# Patient Record
Sex: Male | Born: 1955 | Race: Black or African American | Hispanic: No | Marital: Single | Smoking: Former smoker
Health system: Southern US, Community
[De-identification: ages and names within clinical notes are randomized; demographics above are authoritative.]

## PROBLEM LIST (undated history)

## (undated) DIAGNOSIS — B1920 Unspecified viral hepatitis C without hepatic coma: Secondary | ICD-10-CM

## (undated) DIAGNOSIS — N289 Disorder of kidney and ureter, unspecified: Secondary | ICD-10-CM

## (undated) DIAGNOSIS — J449 Chronic obstructive pulmonary disease, unspecified: Secondary | ICD-10-CM

## (undated) HISTORY — PX: TOTAL HIP ARTHROPLASTY: SHX124

---

## 2006-05-01 ENCOUNTER — Emergency Department (HOSPITAL_COMMUNITY): Admission: EM | Admit: 2006-05-01 | Discharge: 2006-05-01 | Payer: Self-pay | Admitting: Emergency Medicine

## 2006-12-16 ENCOUNTER — Inpatient Hospital Stay (HOSPITAL_COMMUNITY): Admission: EM | Admit: 2006-12-16 | Discharge: 2006-12-24 | Payer: Self-pay | Admitting: Emergency Medicine

## 2006-12-27 ENCOUNTER — Ambulatory Visit: Payer: Self-pay | Admitting: Nurse Practitioner

## 2007-02-26 ENCOUNTER — Emergency Department (HOSPITAL_COMMUNITY): Admission: EM | Admit: 2007-02-26 | Discharge: 2007-02-26 | Payer: Self-pay | Admitting: *Deleted

## 2007-03-29 ENCOUNTER — Emergency Department (HOSPITAL_COMMUNITY): Admission: EM | Admit: 2007-03-29 | Discharge: 2007-03-30 | Payer: Self-pay | Admitting: Emergency Medicine

## 2007-03-30 ENCOUNTER — Emergency Department (HOSPITAL_COMMUNITY): Admission: EM | Admit: 2007-03-30 | Discharge: 2007-03-30 | Payer: Self-pay | Admitting: Emergency Medicine

## 2007-05-13 ENCOUNTER — Emergency Department (HOSPITAL_COMMUNITY): Admission: EM | Admit: 2007-05-13 | Discharge: 2007-05-13 | Payer: Self-pay | Admitting: Emergency Medicine

## 2007-05-14 ENCOUNTER — Emergency Department (HOSPITAL_COMMUNITY): Admission: EM | Admit: 2007-05-14 | Discharge: 2007-05-14 | Payer: Self-pay | Admitting: Emergency Medicine

## 2007-05-16 ENCOUNTER — Emergency Department (HOSPITAL_COMMUNITY): Admission: EM | Admit: 2007-05-16 | Discharge: 2007-05-16 | Payer: Self-pay | Admitting: Emergency Medicine

## 2007-06-07 ENCOUNTER — Emergency Department (HOSPITAL_COMMUNITY): Admission: EM | Admit: 2007-06-07 | Discharge: 2007-06-07 | Payer: Self-pay | Admitting: Emergency Medicine

## 2007-06-08 ENCOUNTER — Emergency Department (HOSPITAL_COMMUNITY): Admission: EM | Admit: 2007-06-08 | Discharge: 2007-06-08 | Payer: Self-pay | Admitting: Emergency Medicine

## 2007-06-15 ENCOUNTER — Emergency Department (HOSPITAL_COMMUNITY): Admission: EM | Admit: 2007-06-15 | Discharge: 2007-06-15 | Payer: Self-pay | Admitting: Emergency Medicine

## 2007-06-15 ENCOUNTER — Emergency Department (HOSPITAL_COMMUNITY): Admission: EM | Admit: 2007-06-15 | Discharge: 2007-06-16 | Payer: Self-pay | Admitting: Emergency Medicine

## 2007-12-17 ENCOUNTER — Ambulatory Visit: Payer: Self-pay | Admitting: Cardiology

## 2007-12-18 ENCOUNTER — Inpatient Hospital Stay (HOSPITAL_COMMUNITY): Admission: EM | Admit: 2007-12-18 | Discharge: 2007-12-31 | Payer: Self-pay | Admitting: Emergency Medicine

## 2007-12-18 ENCOUNTER — Encounter (INDEPENDENT_AMBULATORY_CARE_PROVIDER_SITE_OTHER): Payer: Self-pay | Admitting: Internal Medicine

## 2008-01-16 ENCOUNTER — Ambulatory Visit: Payer: Self-pay | Admitting: Internal Medicine

## 2008-01-16 LAB — CONVERTED CEMR LAB
AST: 34 units/L (ref 0–37)
Albumin: 3.6 g/dL (ref 3.5–5.2)
Alkaline Phosphatase: 66 units/L (ref 39–117)
BUN: 7 mg/dL (ref 6–23)
Basophils Absolute: 0 10*3/uL (ref 0.0–0.1)
Basophils Relative: 1 % (ref 0–1)
Eosinophils Absolute: 0.2 10*3/uL (ref 0.0–0.7)
HDL: 41 mg/dL (ref >39)
LDL Cholesterol: 71 mg/dL (ref 0–99)
MCHC: 32.5 g/dL (ref 30.0–36.0)
MCV: 104.5 fL — ABNORMAL HIGH (ref 78.0–100.0)
Neutrophils Relative %: 37 % — ABNORMAL LOW (ref 43–77)
Platelets: 203 10*3/uL (ref 150–400)
Potassium: 4.6 meq/L (ref 3.5–5.3)
RDW: 12.6 % (ref 11.5–15.5)
Sodium: 137 meq/L (ref 135–145)
Total Protein: 8.8 g/dL — ABNORMAL HIGH (ref 6.0–8.3)

## 2008-05-11 ENCOUNTER — Ambulatory Visit: Payer: Self-pay | Admitting: Vascular Surgery

## 2008-05-11 ENCOUNTER — Emergency Department (HOSPITAL_COMMUNITY): Admission: EM | Admit: 2008-05-11 | Discharge: 2008-05-11 | Payer: Self-pay | Admitting: Emergency Medicine

## 2008-05-11 ENCOUNTER — Encounter (INDEPENDENT_AMBULATORY_CARE_PROVIDER_SITE_OTHER): Payer: Self-pay | Admitting: Emergency Medicine

## 2008-08-28 IMAGING — CR DG HIP COMPLETE 2+V*R*
3 series · 3 of 3 positions shown · non-contrast
Comparison: 02/26/2007.

CLINICAL DATA: Right hip pain following a fall.

RIGHT HIP - 3 VIEW

[t hip ap right]
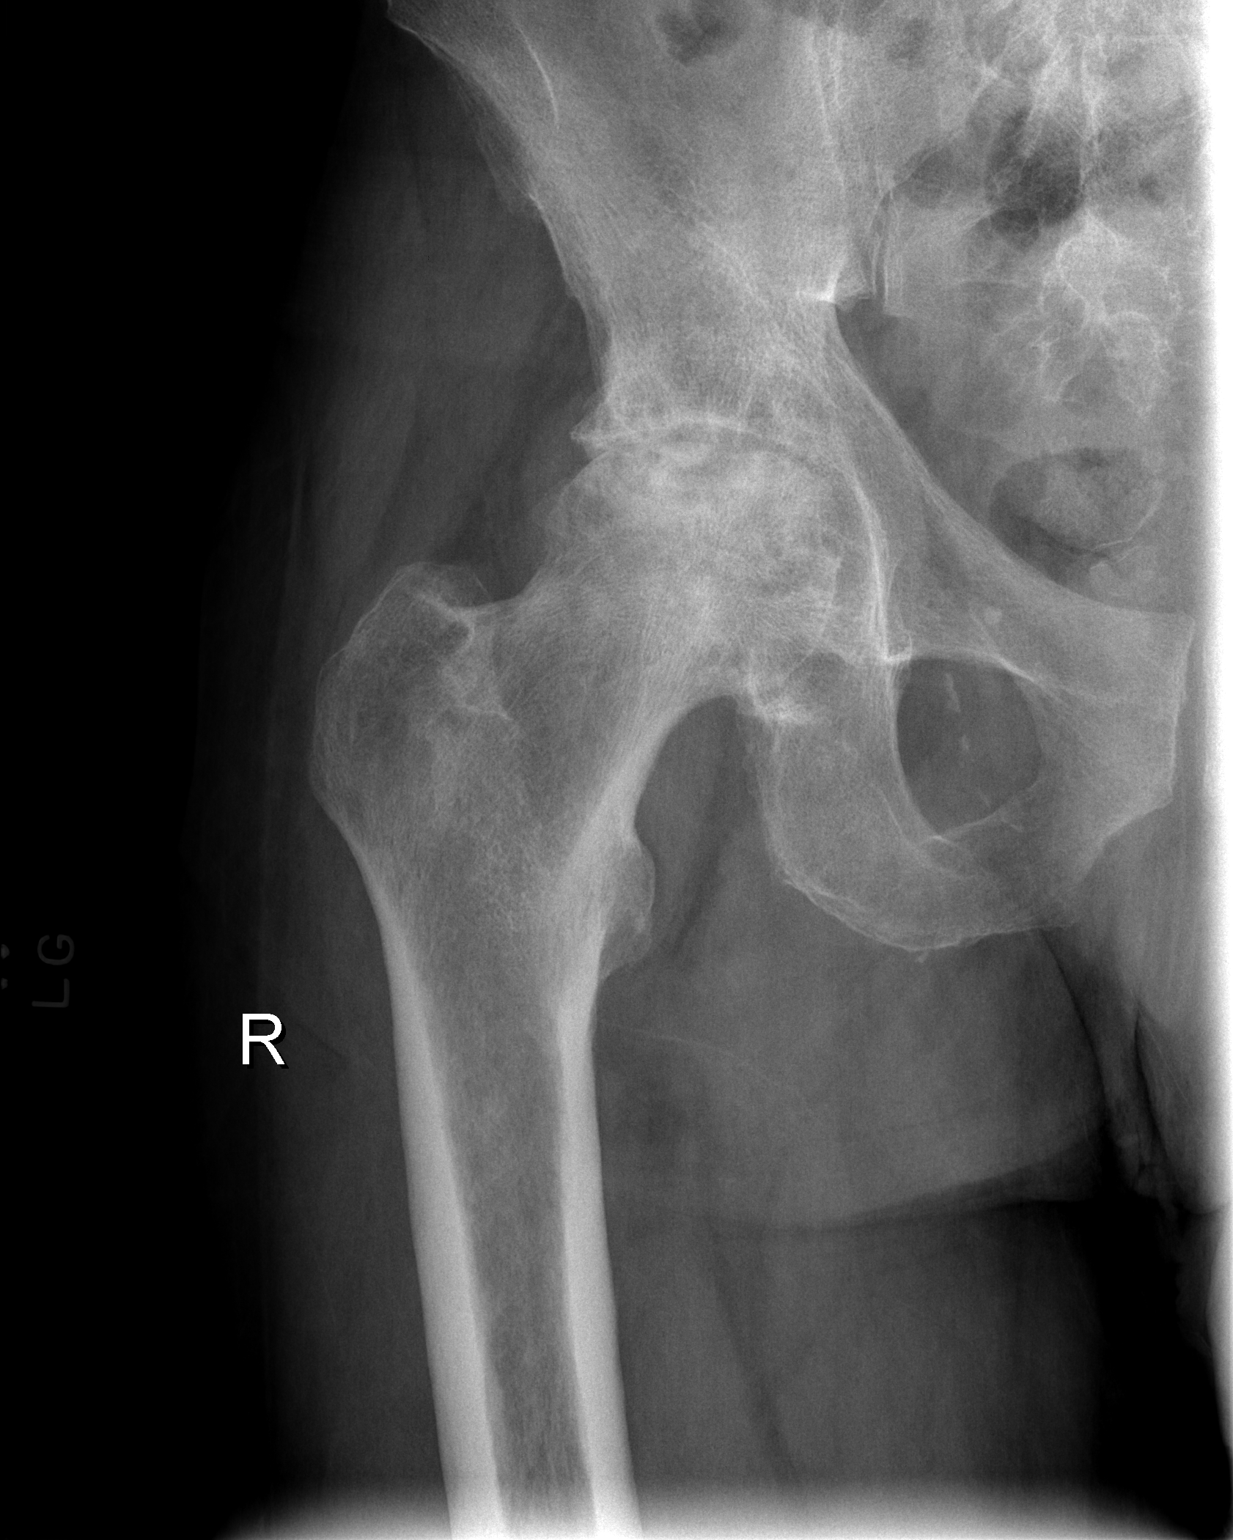

[t pelvis a.p.]
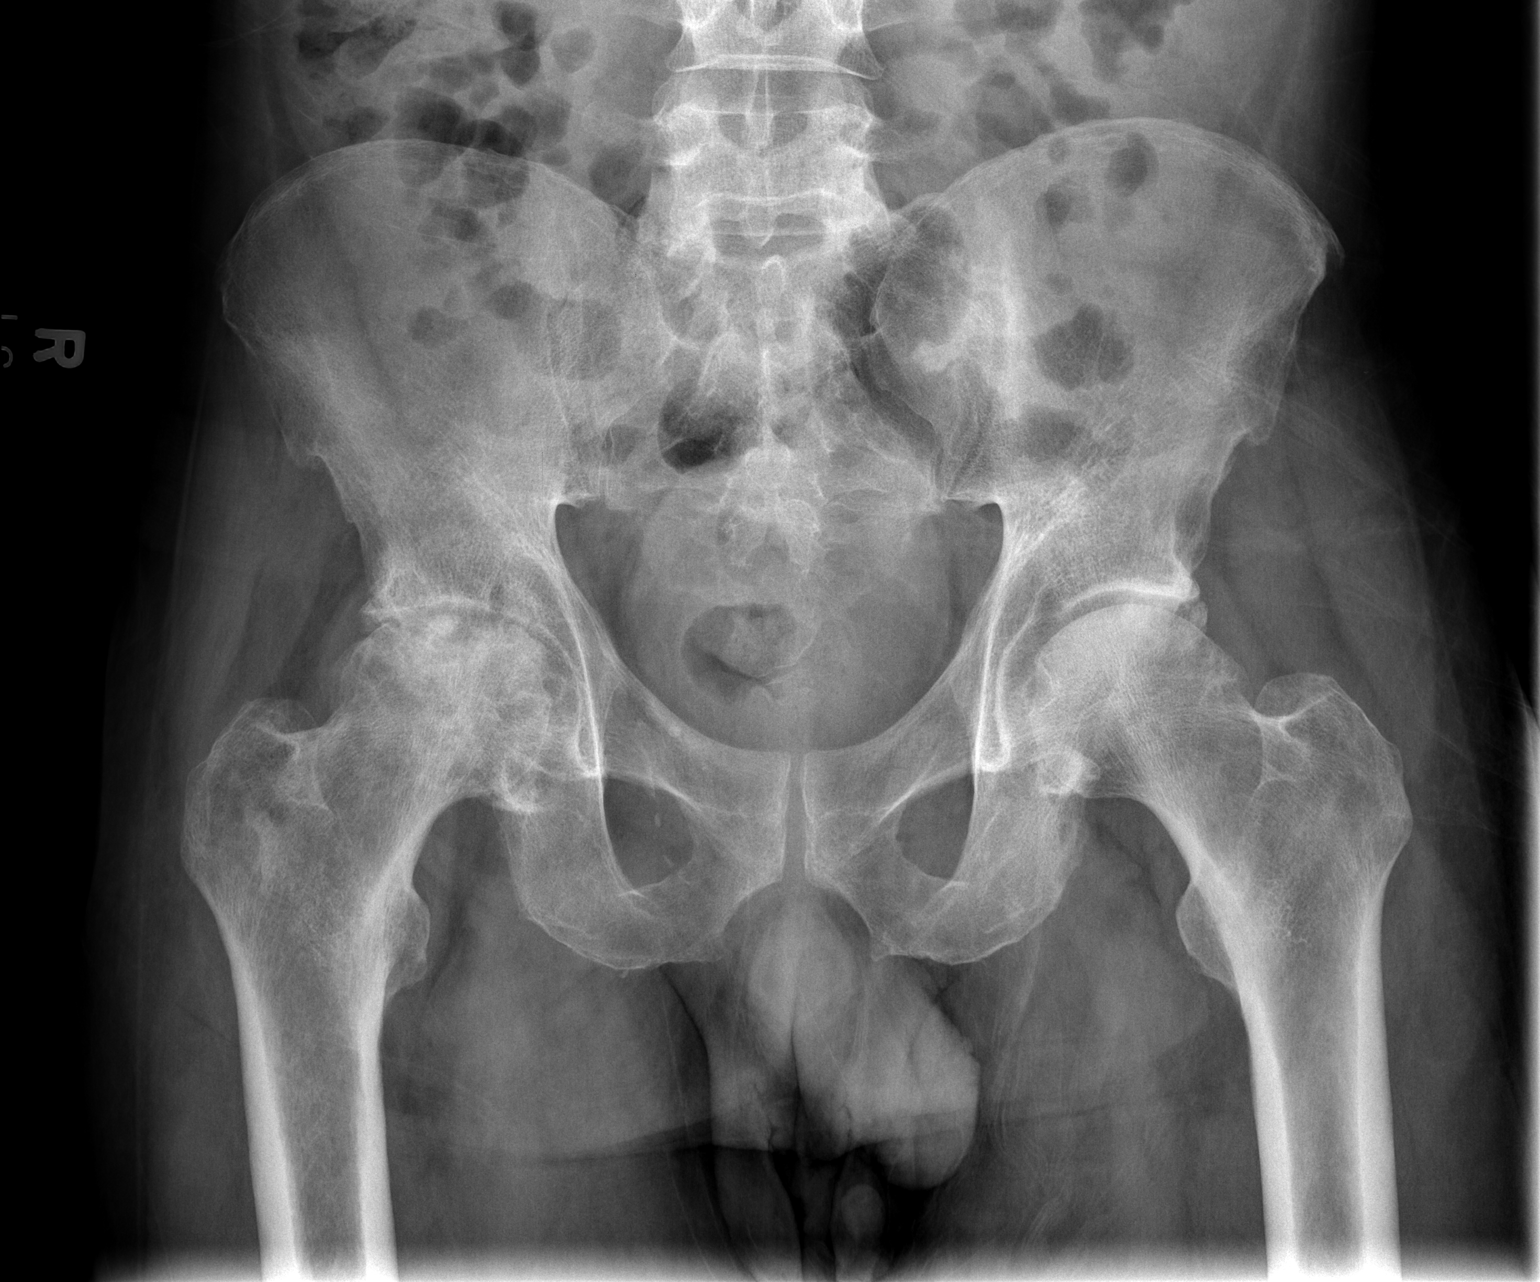

[t hip frog leg right]
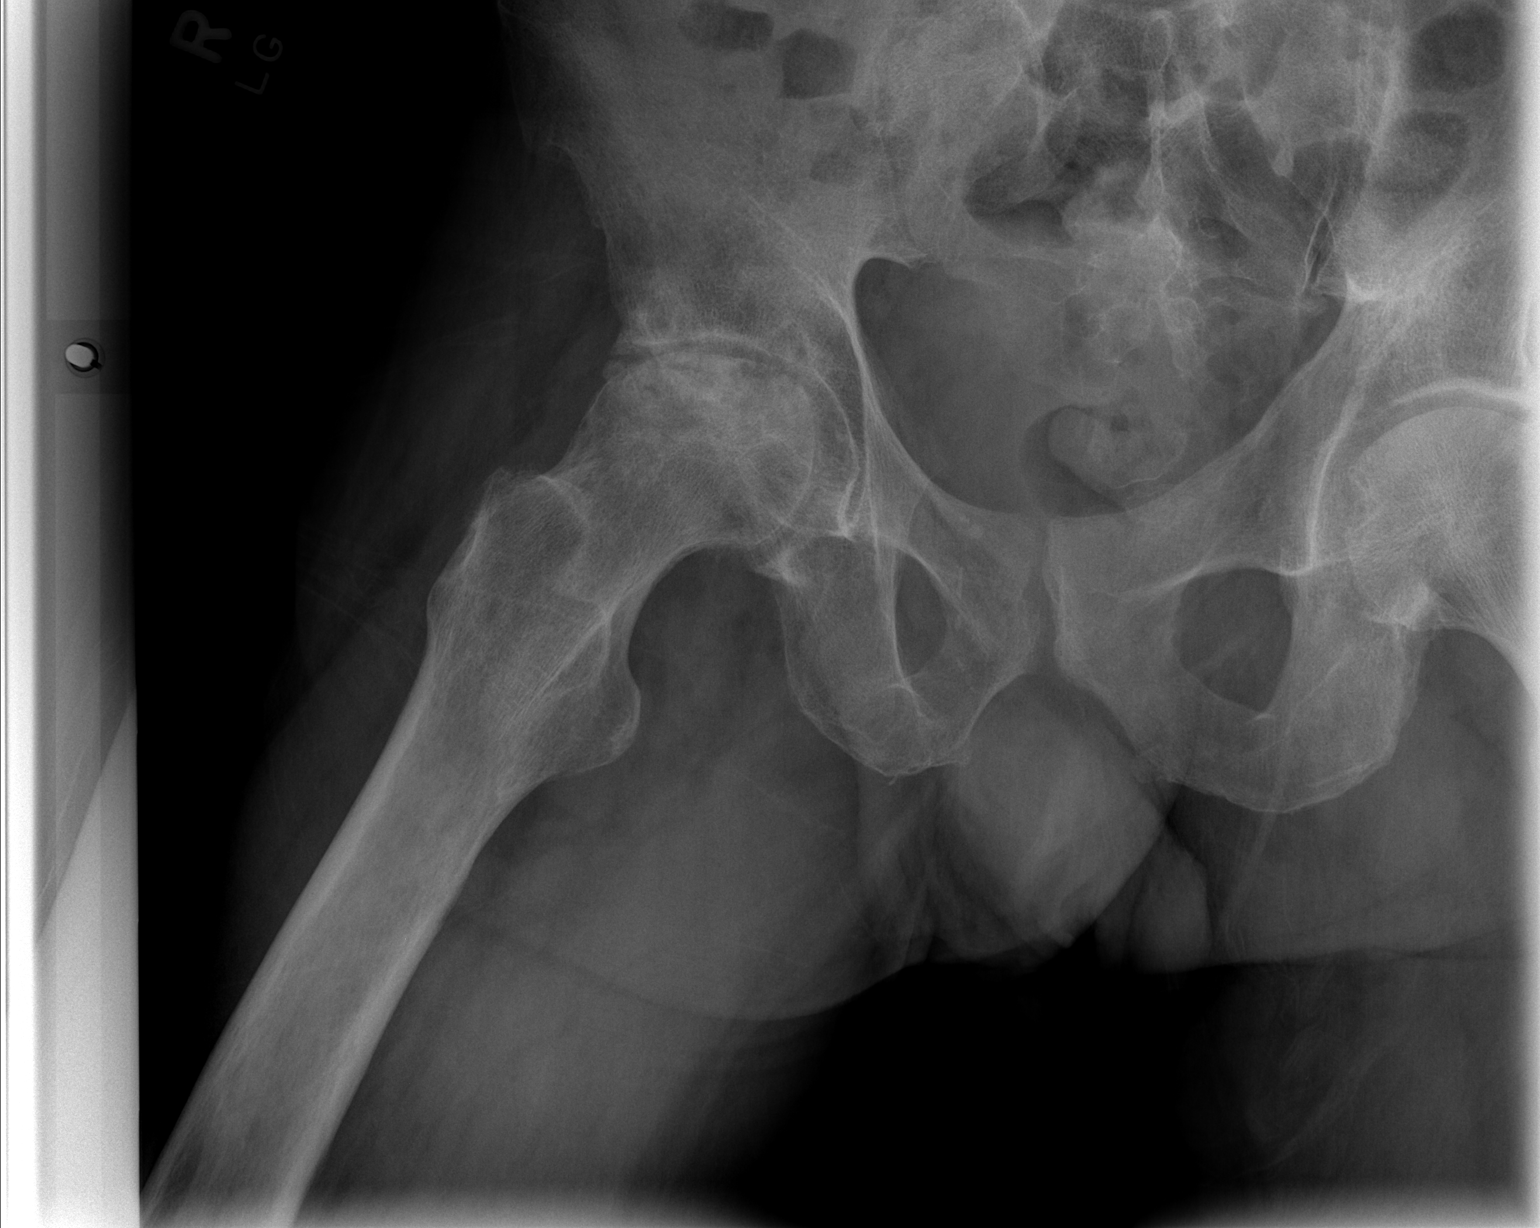

[3 of 3 positions shown; findings below may reference images not displayed]

FINDINGS: Stable marked right hip degenerative changes and moderate left hip
degenerative changes. There is sclerosis and lucency involving the entire right
femoral head and superolateral aspect of the left femoral head. There is
continued mild collapse of the superior aspect of the femoral head on the right.
No acute fracture or dislocation seen.

IMPRESSION

1. No acute fracture or dislocation.
2. Stable bilateral femoral head avascular necrosis and degenerative changes,
greater on the right.

## 2008-10-15 ENCOUNTER — Ambulatory Visit: Payer: Self-pay | Admitting: Internal Medicine

## 2008-10-20 ENCOUNTER — Ambulatory Visit: Payer: Self-pay | Admitting: Internal Medicine

## 2008-10-20 ENCOUNTER — Ambulatory Visit (HOSPITAL_COMMUNITY): Admission: RE | Admit: 2008-10-20 | Discharge: 2008-10-20 | Payer: Self-pay | Admitting: Internal Medicine

## 2008-12-08 ENCOUNTER — Ambulatory Visit: Payer: Self-pay | Admitting: Internal Medicine

## 2009-04-04 ENCOUNTER — Ambulatory Visit: Payer: Self-pay | Admitting: Vascular Surgery

## 2009-04-04 ENCOUNTER — Encounter (INDEPENDENT_AMBULATORY_CARE_PROVIDER_SITE_OTHER): Payer: Self-pay | Admitting: Emergency Medicine

## 2009-04-04 ENCOUNTER — Inpatient Hospital Stay (HOSPITAL_COMMUNITY): Admission: EM | Admit: 2009-04-04 | Discharge: 2009-04-25 | Payer: Self-pay | Admitting: Emergency Medicine

## 2009-04-12 ENCOUNTER — Encounter: Payer: Self-pay | Admitting: Internal Medicine

## 2009-04-12 ENCOUNTER — Ambulatory Visit: Payer: Self-pay | Admitting: Internal Medicine

## 2009-04-16 ENCOUNTER — Encounter: Payer: Self-pay | Admitting: Internal Medicine

## 2010-01-03 ENCOUNTER — Inpatient Hospital Stay (HOSPITAL_COMMUNITY): Admission: EM | Admit: 2010-01-03 | Discharge: 2010-01-11 | Payer: Self-pay | Admitting: Emergency Medicine

## 2010-01-03 ENCOUNTER — Encounter (INDEPENDENT_AMBULATORY_CARE_PROVIDER_SITE_OTHER): Payer: Self-pay | Admitting: Emergency Medicine

## 2010-01-03 ENCOUNTER — Ambulatory Visit: Payer: Self-pay | Admitting: Critical Care Medicine

## 2010-01-03 ENCOUNTER — Ambulatory Visit: Payer: Self-pay | Admitting: Surgery

## 2010-01-05 ENCOUNTER — Encounter: Payer: Self-pay | Admitting: Internal Medicine

## 2010-05-08 ENCOUNTER — Encounter: Payer: Self-pay | Admitting: Orthopedic Surgery

## 2010-06-30 LAB — CARDIAC PANEL(CRET KIN+CKTOT+MB+TROPI)
CK, MB: 25.5 ng/mL (ref 0.3–4.0)
Relative Index: 0.9 (ref 0.0–2.5)
Relative Index: 2.6 — ABNORMAL HIGH (ref 0.0–2.5)
Total CK: 991 U/L — ABNORMAL HIGH (ref 7–232)
Troponin I: 0.05 ng/mL (ref 0.00–0.06)

## 2010-06-30 LAB — BASIC METABOLIC PANEL
BUN: 6 mg/dL (ref 6–23)
BUN: 8 mg/dL (ref 6–23)
CO2: 23 mEq/L (ref 19–32)
CO2: 24 mEq/L (ref 19–32)
CO2: 28 mEq/L (ref 19–32)
Calcium: 8.6 mg/dL (ref 8.4–10.5)
Calcium: 8.8 mg/dL (ref 8.4–10.5)
Calcium: 8.8 mg/dL (ref 8.4–10.5)
Chloride: 112 mEq/L (ref 96–112)
Creatinine, Ser: 0.46 mg/dL (ref 0.4–1.5)
Creatinine, Ser: 0.5 mg/dL (ref 0.4–1.5)
GFR calc Af Amer: >60 mL/min (ref >60)
GFR calc Af Amer: >60 mL/min (ref >60)
GFR calc Af Amer: >60 mL/min (ref >60)
GFR calc non Af Amer: >60 mL/min (ref >60)
GFR calc non Af Amer: >60 mL/min (ref >60)
Glucose, Bld: 107 mg/dL — ABNORMAL HIGH (ref 70–99)
Glucose, Bld: 80 mg/dL (ref 70–99)
Glucose, Bld: 95 mg/dL (ref 70–99)
Potassium: 3.4 mEq/L — ABNORMAL LOW (ref 3.5–5.1)
Sodium: 137 mEq/L (ref 135–145)
Sodium: 138 mEq/L (ref 135–145)

## 2010-06-30 LAB — BRAIN NATRIURETIC PEPTIDE
Pro B Natriuretic peptide (BNP): 40 pg/mL (ref 0.0–100.0)
Pro B Natriuretic peptide (BNP): <30 pg/mL (ref 0.0–100.0)

## 2010-06-30 LAB — DIFFERENTIAL
Basophils Absolute: 0 10*3/uL (ref 0.0–0.1)
Basophils Absolute: 0 10*3/uL (ref 0.0–0.1)
Basophils Relative: 0 % (ref 0–1)
Eosinophils Absolute: 0 10*3/uL (ref 0.0–0.7)
Lymphocytes Relative: 15 % (ref 12–46)
Lymphocytes Relative: 23 % (ref 12–46)
Lymphs Abs: 2.5 10*3/uL (ref 0.7–4.0)
Neutro Abs: 10 10*3/uL — ABNORMAL HIGH (ref 1.7–7.7)
Neutrophils Relative %: 63 % (ref 43–77)
Neutrophils Relative %: 74 % (ref 43–77)

## 2010-06-30 LAB — CBC
HCT: 33.5 % — ABNORMAL LOW (ref 39.0–52.0)
HCT: 33.6 % — ABNORMAL LOW (ref 39.0–52.0)
HCT: 34.2 % — ABNORMAL LOW (ref 39.0–52.0)
Hemoglobin: 10.9 g/dL — ABNORMAL LOW (ref 13.0–17.0)
Hemoglobin: 11 g/dL — ABNORMAL LOW (ref 13.0–17.0)
Hemoglobin: 11.2 g/dL — ABNORMAL LOW (ref 13.0–17.0)
Hemoglobin: 11.9 g/dL — ABNORMAL LOW (ref 13.0–17.0)
Hemoglobin: 13.7 g/dL (ref 13.0–17.0)
MCH: 34.2 pg — ABNORMAL HIGH (ref 26.0–34.0)
MCH: 34.3 pg — ABNORMAL HIGH (ref 26.0–34.0)
MCH: 34.7 pg — ABNORMAL HIGH (ref 26.0–34.0)
MCH: 34.8 pg — ABNORMAL HIGH (ref 26.0–34.0)
MCH: 35.6 pg — ABNORMAL HIGH (ref 26.0–34.0)
MCHC: 32.7 g/dL (ref 30.0–36.0)
MCHC: 33 g/dL (ref 30.0–36.0)
MCHC: 33.1 g/dL (ref 30.0–36.0)
MCHC: 34.4 g/dL (ref 30.0–36.0)
MCV: 104.4 fL — ABNORMAL HIGH (ref 78.0–100.0)
Platelets: 222 10*3/uL (ref 150–400)
Platelets: 254 10*3/uL (ref 150–400)
Platelets: 263 10*3/uL (ref 150–400)
RBC: 3.19 MIL/uL — ABNORMAL LOW (ref 4.22–5.81)
RBC: 3.22 MIL/uL — ABNORMAL LOW (ref 4.22–5.81)
RBC: 3.27 MIL/uL — ABNORMAL LOW (ref 4.22–5.81)
RBC: 3.94 MIL/uL — ABNORMAL LOW (ref 4.22–5.81)
RBC: 3.96 MIL/uL — ABNORMAL LOW (ref 4.22–5.81)
RDW: 11.9 % (ref 11.5–15.5)
RDW: 11.9 % (ref 11.5–15.5)
RDW: 12.1 % (ref 11.5–15.5)
WBC: 11.9 10*3/uL — ABNORMAL HIGH (ref 4.0–10.5)
WBC: 13.4 10*3/uL — ABNORMAL HIGH (ref 4.0–10.5)
WBC: 15.5 10*3/uL — ABNORMAL HIGH (ref 4.0–10.5)
WBC: 8.9 10*3/uL (ref 4.0–10.5)

## 2010-06-30 LAB — RENAL FUNCTION PANEL
Albumin: 1.9 g/dL — ABNORMAL LOW (ref 3.5–5.2)
Albumin: 2.1 g/dL — ABNORMAL LOW (ref 3.5–5.2)
CO2: 32 mEq/L (ref 19–32)
Calcium: 7.7 mg/dL — ABNORMAL LOW (ref 8.4–10.5)
Chloride: 106 mEq/L (ref 96–112)
Chloride: 112 mEq/L (ref 96–112)
Creatinine, Ser: 0.45 mg/dL (ref 0.4–1.5)
GFR calc Af Amer: >60 mL/min (ref >60)
GFR calc Af Amer: >60 mL/min (ref >60)
GFR calc non Af Amer: >60 mL/min (ref >60)
GFR calc non Af Amer: >60 mL/min (ref >60)
Glucose, Bld: 111 mg/dL — ABNORMAL HIGH (ref 70–99)
Potassium: 2.8 mEq/L — ABNORMAL LOW (ref 3.5–5.1)
Sodium: 137 mEq/L (ref 135–145)
Sodium: 137 mEq/L (ref 135–145)
Sodium: 141 mEq/L (ref 135–145)

## 2010-06-30 LAB — URINALYSIS, ROUTINE W REFLEX MICROSCOPIC
Ketones, ur: 15 mg/dL — AB
Nitrite: POSITIVE — AB
pH: 6 (ref 5.0–8.0)

## 2010-06-30 LAB — D-DIMER, QUANTITATIVE: D-Dimer, Quant: 0.58 ug/mL-FEU — ABNORMAL HIGH (ref 0.00–0.48)

## 2010-06-30 LAB — CK TOTAL AND CKMB (NOT AT ARMC)
CK, MB: 1024.6 ng/mL (ref 0.3–4.0)
CK, MB: 1222.3 ng/mL (ref 0.3–4.0)
Total CK: 110675 U/L — ABNORMAL HIGH (ref 7–232)
Total CK: 60123 U/L — ABNORMAL HIGH (ref 7–232)

## 2010-06-30 LAB — RAPID URINE DRUG SCREEN, HOSP PERFORMED
Barbiturates: NOT DETECTED
Cocaine: NOT DETECTED
Opiates: NOT DETECTED

## 2010-06-30 LAB — CARBOXYHEMOGLOBIN
Methemoglobin: 0.5 % (ref 0.0–1.5)
O2 Saturation: 80.6 %
Total hemoglobin: 11.9 g/dL — ABNORMAL LOW (ref 13.5–18.0)

## 2010-06-30 LAB — ANTI-NUCLEAR AB-TITER (ANA TITER)

## 2010-06-30 LAB — COMPREHENSIVE METABOLIC PANEL
ALT: 273 U/L — ABNORMAL HIGH (ref 0–53)
Alkaline Phosphatase: 65 U/L (ref 39–117)
Alkaline Phosphatase: 79 U/L (ref 39–117)
BUN: 8 mg/dL (ref 6–23)
BUN: 8 mg/dL (ref 6–23)
CO2: 17 mEq/L — ABNORMAL LOW (ref 19–32)
CO2: 29 mEq/L (ref 19–32)
Calcium: 8.9 mg/dL (ref 8.4–10.5)
Chloride: 101 mEq/L (ref 96–112)
Chloride: 104 mEq/L (ref 96–112)
Creatinine, Ser: 0.46 mg/dL (ref 0.4–1.5)
Creatinine, Ser: 0.49 mg/dL (ref 0.4–1.5)
GFR calc non Af Amer: >60 mL/min (ref >60)
GFR calc non Af Amer: >60 mL/min (ref >60)
GFR calc non Af Amer: >60 mL/min (ref >60)
Glucose, Bld: 85 mg/dL (ref 70–99)
Glucose, Bld: 86 mg/dL (ref 70–99)
Glucose, Bld: 88 mg/dL (ref 70–99)
Potassium: 3.6 mEq/L (ref 3.5–5.1)
Potassium: 3.9 mEq/L (ref 3.5–5.1)
Sodium: 138 mEq/L (ref 135–145)
Total Bilirubin: 1 mg/dL (ref 0.3–1.2)
Total Bilirubin: 1.4 mg/dL — ABNORMAL HIGH (ref 0.3–1.2)

## 2010-06-30 LAB — URINE CULTURE
Colony Count: NO GROWTH
Culture  Setup Time: 201109191423
Culture: NO GROWTH

## 2010-06-30 LAB — POCT CARDIAC MARKERS
CKMB, poc: >80 ng/mL (ref 1.0–8.0)
Troponin i, poc: <0.05 ng/mL (ref 0.00–0.09)

## 2010-06-30 LAB — ANA: Anti Nuclear Antibody(ANA): POSITIVE — AB

## 2010-06-30 LAB — HEMOCCULT GUIAC POC 1CARD (OFFICE): Fecal Occult Bld: NEGATIVE

## 2010-06-30 LAB — CK
Total CK: 1476 U/L — ABNORMAL HIGH (ref 7–232)
Total CK: >50000 U/L — ABNORMAL HIGH (ref 7–232)
Total CK: >50000 U/L — ABNORMAL HIGH (ref 7–232)

## 2010-06-30 LAB — MAGNESIUM
Magnesium: 2.1 mg/dL (ref 1.5–2.5)
Magnesium: 2.2 mg/dL (ref 1.5–2.5)

## 2010-06-30 LAB — CULTURE, BLOOD (ROUTINE X 2)
Culture: NO GROWTH
Culture: NO GROWTH

## 2010-06-30 LAB — TYPE AND SCREEN

## 2010-06-30 LAB — MRSA PCR SCREENING: MRSA by PCR: NEGATIVE

## 2010-06-30 LAB — STREP PNEUMONIAE URINARY ANTIGEN: Strep Pneumo Urinary Antigen: NEGATIVE

## 2010-06-30 LAB — PROTIME-INR
INR: 1.2 (ref 0.00–1.49)
Prothrombin Time: 15.4 seconds — ABNORMAL HIGH (ref 11.6–15.2)

## 2010-06-30 LAB — CRYOGLOBULIN

## 2010-06-30 LAB — ANTI-DNA ANTIBODY, DOUBLE-STRANDED: ds DNA Ab: 4 IU/mL (ref <30)

## 2010-06-30 LAB — PHOSPHORUS: Phosphorus: 2.5 mg/dL (ref 2.3–4.6)

## 2010-06-30 LAB — AMYLASE: Amylase: 35 U/L (ref 0–105)

## 2010-06-30 LAB — TROPONIN I: Troponin I: 0.18 ng/mL — ABNORMAL HIGH (ref 0.00–0.06)

## 2010-06-30 LAB — LACTIC ACID, PLASMA: Lactic Acid, Venous: 1.9 mmol/L (ref 0.5–2.2)

## 2010-06-30 LAB — HIV ANTIBODY (ROUTINE TESTING W REFLEX): HIV: NONREACTIVE

## 2010-06-30 LAB — SJOGRENS SYNDROME-B EXTRACTABLE NUCLEAR ANTIBODY: SSB (La) (ENA) Antibody, IgG: 2 AU/mL (ref <30)

## 2010-06-30 LAB — URINE MICROSCOPIC-ADD ON

## 2010-07-03 LAB — URINALYSIS, MICROSCOPIC ONLY
Glucose, UA: NEGATIVE mg/dL
Leukocytes, UA: NEGATIVE
Specific Gravity, Urine: 1.009 (ref 1.005–1.030)
pH: 8 (ref 5.0–8.0)

## 2010-07-03 LAB — CBC
HCT: 24.6 % — ABNORMAL LOW (ref 39.0–52.0)
HCT: 25.1 % — ABNORMAL LOW (ref 39.0–52.0)
HCT: 25.4 % — ABNORMAL LOW (ref 39.0–52.0)
HCT: 27.4 % — ABNORMAL LOW (ref 39.0–52.0)
HCT: 32.3 % — ABNORMAL LOW (ref 39.0–52.0)
Hemoglobin: 8.4 g/dL — ABNORMAL LOW (ref 13.0–17.0)
Hemoglobin: 8.6 g/dL — ABNORMAL LOW (ref 13.0–17.0)
Hemoglobin: 9.2 g/dL — ABNORMAL LOW (ref 13.0–17.0)
MCHC: 33.1 g/dL (ref 30.0–36.0)
MCHC: 33.2 g/dL (ref 30.0–36.0)
MCHC: 33.7 g/dL (ref 30.0–36.0)
MCHC: 33.8 g/dL (ref 30.0–36.0)
MCHC: 33.9 g/dL (ref 30.0–36.0)
MCHC: 34.2 g/dL (ref 30.0–36.0)
MCHC: 34.3 g/dL (ref 30.0–36.0)
MCV: 101.1 fL — ABNORMAL HIGH (ref 78.0–100.0)
MCV: 101.2 fL — ABNORMAL HIGH (ref 78.0–100.0)
MCV: 101.9 fL — ABNORMAL HIGH (ref 78.0–100.0)
MCV: 102 fL — ABNORMAL HIGH (ref 78.0–100.0)
MCV: 103.1 fL — ABNORMAL HIGH (ref 78.0–100.0)
MCV: 103.3 fL — ABNORMAL HIGH (ref 78.0–100.0)
MCV: 103.4 fL — ABNORMAL HIGH (ref 78.0–100.0)
MCV: 106.2 fL — ABNORMAL HIGH (ref 78.0–100.0)
Platelets: 357 10*3/uL (ref 150–400)
Platelets: 361 10*3/uL (ref 150–400)
Platelets: 393 10*3/uL (ref 150–400)
Platelets: 492 10*3/uL — ABNORMAL HIGH (ref 150–400)
Platelets: 523 10*3/uL — ABNORMAL HIGH (ref 150–400)
RBC: 1.88 MIL/uL — ABNORMAL LOW (ref 4.22–5.81)
RBC: 2.4 MIL/uL — ABNORMAL LOW (ref 4.22–5.81)
RBC: 2.9 MIL/uL — ABNORMAL LOW (ref 4.22–5.81)
RDW: 14.4 % (ref 11.5–15.5)
RDW: 15.4 % (ref 11.5–15.5)
RDW: 15.6 % — ABNORMAL HIGH (ref 11.5–15.5)
RDW: 15.8 % — ABNORMAL HIGH (ref 11.5–15.5)
RDW: 16.2 % — ABNORMAL HIGH (ref 11.5–15.5)
RDW: 16.3 % — ABNORMAL HIGH (ref 11.5–15.5)
RDW: 16.9 % — ABNORMAL HIGH (ref 11.5–15.5)
WBC: 13.5 10*3/uL — ABNORMAL HIGH (ref 4.0–10.5)
WBC: 14.1 10*3/uL — ABNORMAL HIGH (ref 4.0–10.5)

## 2010-07-03 LAB — GLUCOSE, CAPILLARY
Glucose-Capillary: 108 mg/dL — ABNORMAL HIGH (ref 70–99)
Glucose-Capillary: 114 mg/dL — ABNORMAL HIGH (ref 70–99)
Glucose-Capillary: 75 mg/dL (ref 70–99)
Glucose-Capillary: 77 mg/dL (ref 70–99)
Glucose-Capillary: 79 mg/dL (ref 70–99)
Glucose-Capillary: 93 mg/dL (ref 70–99)

## 2010-07-03 LAB — RENAL FUNCTION PANEL
Albumin: 1.8 g/dL — ABNORMAL LOW (ref 3.5–5.2)
Albumin: 2.4 g/dL — ABNORMAL LOW (ref 3.5–5.2)
BUN: 11 mg/dL (ref 6–23)
BUN: 16 mg/dL (ref 6–23)
BUN: 26 mg/dL — ABNORMAL HIGH (ref 6–23)
CO2: 23 mEq/L (ref 19–32)
CO2: 24 mEq/L (ref 19–32)
CO2: 27 mEq/L (ref 19–32)
Calcium: 8.5 mg/dL (ref 8.4–10.5)
Calcium: 8.7 mg/dL (ref 8.4–10.5)
Calcium: 9.6 mg/dL (ref 8.4–10.5)
Chloride: 102 mEq/L (ref 96–112)
Chloride: 103 mEq/L (ref 96–112)
Chloride: 103 mEq/L (ref 96–112)
Creatinine, Ser: 3.63 mg/dL — ABNORMAL HIGH (ref 0.4–1.5)
Creatinine, Ser: 4.44 mg/dL — ABNORMAL HIGH (ref 0.4–1.5)
Creatinine, Ser: 5.17 mg/dL — ABNORMAL HIGH (ref 0.4–1.5)
Creatinine, Ser: 5.21 mg/dL — ABNORMAL HIGH (ref 0.4–1.5)
Creatinine, Ser: 5.51 mg/dL — ABNORMAL HIGH (ref 0.4–1.5)
GFR calc Af Amer: 13 mL/min — ABNORMAL LOW (ref >60)
GFR calc Af Amer: 14 mL/min — ABNORMAL LOW (ref >60)
GFR calc non Af Amer: 11 mL/min — ABNORMAL LOW (ref >60)
GFR calc non Af Amer: 12 mL/min — ABNORMAL LOW (ref >60)
Glucose, Bld: 82 mg/dL (ref 70–99)
Glucose, Bld: 92 mg/dL (ref 70–99)
Phosphorus: 6.6 mg/dL — ABNORMAL HIGH (ref 2.3–4.6)
Phosphorus: 6.9 mg/dL — ABNORMAL HIGH (ref 2.3–4.6)
Phosphorus: 9.3 mg/dL — ABNORMAL HIGH (ref 2.3–4.6)
Potassium: 4.2 mEq/L (ref 3.5–5.1)

## 2010-07-03 LAB — HEMOGLOBIN AND HEMATOCRIT, BLOOD
HCT: 20 % — ABNORMAL LOW (ref 39.0–52.0)
HCT: 25.2 % — ABNORMAL LOW (ref 39.0–52.0)
Hemoglobin: 8.8 g/dL — ABNORMAL LOW (ref 13.0–17.0)

## 2010-07-03 LAB — PHOSPHORUS
Phosphorus: 6.5 mg/dL — ABNORMAL HIGH (ref 2.3–4.6)
Phosphorus: 7 mg/dL — ABNORMAL HIGH (ref 2.3–4.6)

## 2010-07-03 LAB — COMPREHENSIVE METABOLIC PANEL
ALT: 38 U/L (ref 0–53)
ALT: 46 U/L (ref 0–53)
ALT: 56 U/L — ABNORMAL HIGH (ref 0–53)
AST: 40 U/L — ABNORMAL HIGH (ref 0–37)
Alkaline Phosphatase: 84 U/L (ref 39–117)
BUN: 16 mg/dL (ref 6–23)
CO2: 18 mEq/L — ABNORMAL LOW (ref 19–32)
CO2: 22 mEq/L (ref 19–32)
Calcium: 8.7 mg/dL (ref 8.4–10.5)
Calcium: 9.1 mg/dL (ref 8.4–10.5)
Creatinine, Ser: 4.48 mg/dL — ABNORMAL HIGH (ref 0.4–1.5)
GFR calc Af Amer: 17 mL/min — ABNORMAL LOW (ref >60)
GFR calc non Af Amer: 11 mL/min — ABNORMAL LOW (ref >60)
GFR calc non Af Amer: 14 mL/min — ABNORMAL LOW (ref >60)
Glucose, Bld: 86 mg/dL (ref 70–99)
Glucose, Bld: 92 mg/dL (ref 70–99)
Potassium: 3.6 mEq/L (ref 3.5–5.1)
Sodium: 138 mEq/L (ref 135–145)
Sodium: 139 mEq/L (ref 135–145)
Total Protein: 8.1 g/dL (ref 6.0–8.3)
Total Protein: 8.1 g/dL (ref 6.0–8.3)
Total Protein: 8.8 g/dL — ABNORMAL HIGH (ref 6.0–8.3)

## 2010-07-03 LAB — BASIC METABOLIC PANEL
Chloride: 107 mEq/L (ref 96–112)
GFR calc non Af Amer: 17 mL/min — ABNORMAL LOW (ref >60)
Glucose, Bld: 75 mg/dL (ref 70–99)
Potassium: 3.7 mEq/L (ref 3.5–5.1)
Sodium: 138 mEq/L (ref 135–145)

## 2010-07-03 LAB — DIFFERENTIAL
Basophils Absolute: 0.1 10*3/uL (ref 0.0–0.1)
Basophils Relative: 1 % (ref 0–1)
Eosinophils Absolute: 0.5 10*3/uL (ref 0.0–0.7)
Eosinophils Relative: 4 % (ref 0–5)
Neutrophils Relative %: 62 % (ref 43–77)

## 2010-07-03 LAB — MAGNESIUM
Magnesium: 2.1 mg/dL (ref 1.5–2.5)
Magnesium: 2.3 mg/dL (ref 1.5–2.5)

## 2010-07-03 LAB — IRON AND TIBC: TIBC: 232 ug/dL (ref 215–435)

## 2010-07-03 LAB — APTT: aPTT: 37 seconds (ref 24–37)

## 2010-07-03 LAB — FERRITIN: Ferritin: 1622 ng/mL — ABNORMAL HIGH (ref 22–322)

## 2010-07-18 LAB — RENAL FUNCTION PANEL
Albumin: 1.8 g/dL — ABNORMAL LOW (ref 3.5–5.2)
Albumin: 1.8 g/dL — ABNORMAL LOW (ref 3.5–5.2)
Albumin: 2 g/dL — ABNORMAL LOW (ref 3.5–5.2)
BUN: 107 mg/dL — ABNORMAL HIGH (ref 6–23)
BUN: 20 mg/dL (ref 6–23)
BUN: 22 mg/dL (ref 6–23)
BUN: 31 mg/dL — ABNORMAL HIGH (ref 6–23)
BUN: 80 mg/dL — ABNORMAL HIGH (ref 6–23)
CO2: 26 mEq/L (ref 19–32)
Calcium: 8.2 mg/dL — ABNORMAL LOW (ref 8.4–10.5)
Calcium: 8.6 mg/dL (ref 8.4–10.5)
Chloride: 101 mEq/L (ref 96–112)
Chloride: 103 mEq/L (ref 96–112)
Chloride: 105 mEq/L (ref 96–112)
Chloride: 107 mEq/L (ref 96–112)
Chloride: 95 mEq/L — ABNORMAL LOW (ref 96–112)
Chloride: 96 mEq/L (ref 96–112)
Creatinine, Ser: 3.28 mg/dL — ABNORMAL HIGH (ref 0.4–1.5)
Creatinine, Ser: 3.47 mg/dL — ABNORMAL HIGH (ref 0.4–1.5)
Creatinine, Ser: 4.55 mg/dL — ABNORMAL HIGH (ref 0.4–1.5)
GFR calc Af Amer: 11 mL/min — ABNORMAL LOW (ref >60)
GFR calc Af Amer: 17 mL/min — ABNORMAL LOW (ref >60)
GFR calc Af Amer: 24 mL/min — ABNORMAL LOW (ref >60)
GFR calc non Af Amer: 14 mL/min — ABNORMAL LOW (ref >60)
GFR calc non Af Amer: 20 mL/min — ABNORMAL LOW (ref >60)
GFR calc non Af Amer: 9 mL/min — ABNORMAL LOW (ref >60)
Glucose, Bld: 100 mg/dL — ABNORMAL HIGH (ref 70–99)
Glucose, Bld: 123 mg/dL — ABNORMAL HIGH (ref 70–99)
Glucose, Bld: 124 mg/dL — ABNORMAL HIGH (ref 70–99)
Glucose, Bld: 84 mg/dL (ref 70–99)
Glucose, Bld: 84 mg/dL (ref 70–99)
Phosphorus: 1.1 mg/dL — ABNORMAL LOW (ref 2.3–4.6)
Phosphorus: 2.2 mg/dL — ABNORMAL LOW (ref 2.3–4.6)
Phosphorus: 2.8 mg/dL (ref 2.3–4.6)
Phosphorus: 4.7 mg/dL — ABNORMAL HIGH (ref 2.3–4.6)
Potassium: 3.2 mEq/L — ABNORMAL LOW (ref 3.5–5.1)
Potassium: 3.4 mEq/L — ABNORMAL LOW (ref 3.5–5.1)
Potassium: 3.9 mEq/L (ref 3.5–5.1)
Potassium: 4.2 mEq/L (ref 3.5–5.1)
Potassium: 4.3 mEq/L (ref 3.5–5.1)
Potassium: 4.4 mEq/L (ref 3.5–5.1)
Potassium: 4.7 mEq/L (ref 3.5–5.1)
Sodium: 140 mEq/L (ref 135–145)
Sodium: 140 mEq/L (ref 135–145)
Sodium: 148 mEq/L — ABNORMAL HIGH (ref 135–145)
Sodium: 151 mEq/L — ABNORMAL HIGH (ref 135–145)

## 2010-07-18 LAB — BLOOD GAS, ARTERIAL
Acid-Base Excess: 16.4 mmol/L — ABNORMAL HIGH (ref 0.0–2.0)
Drawn by: 249101
O2 Content: 5 L/min
Patient temperature: 98.6
TCO2: 42.5 mmol/L (ref 0–100)

## 2010-07-18 LAB — CULTURE, BLOOD (ROUTINE X 2): Culture: NO GROWTH

## 2010-07-18 LAB — COMPREHENSIVE METABOLIC PANEL
ALT: 124 U/L — ABNORMAL HIGH (ref 0–53)
ALT: 151 U/L — ABNORMAL HIGH (ref 0–53)
ALT: 205 U/L — ABNORMAL HIGH (ref 0–53)
ALT: 252 U/L — ABNORMAL HIGH (ref 0–53)
ALT: 325 U/L — ABNORMAL HIGH (ref 0–53)
ALT: 388 U/L — ABNORMAL HIGH (ref 0–53)
ALT: 72 U/L — ABNORMAL HIGH (ref 0–53)
ALT: 87 U/L — ABNORMAL HIGH (ref 0–53)
AST: 142 U/L — ABNORMAL HIGH (ref 0–37)
AST: 166 U/L — ABNORMAL HIGH (ref 0–37)
AST: 291 U/L — ABNORMAL HIGH (ref 0–37)
AST: 444 U/L — ABNORMAL HIGH (ref 0–37)
AST: 71 U/L — ABNORMAL HIGH (ref 0–37)
Albumin: 1.9 g/dL — ABNORMAL LOW (ref 3.5–5.2)
Albumin: 2 g/dL — ABNORMAL LOW (ref 3.5–5.2)
Albumin: 2 g/dL — ABNORMAL LOW (ref 3.5–5.2)
Albumin: 2.3 g/dL — ABNORMAL LOW (ref 3.5–5.2)
Albumin: 3.3 g/dL — ABNORMAL LOW (ref 3.5–5.2)
Alkaline Phosphatase: 100 U/L (ref 39–117)
Alkaline Phosphatase: 67 U/L (ref 39–117)
Alkaline Phosphatase: 70 U/L (ref 39–117)
Alkaline Phosphatase: 72 U/L (ref 39–117)
Alkaline Phosphatase: 87 U/L (ref 39–117)
BUN: 100 mg/dL — ABNORMAL HIGH (ref 6–23)
BUN: 19 mg/dL (ref 6–23)
BUN: 29 mg/dL — ABNORMAL HIGH (ref 6–23)
BUN: 34 mg/dL — ABNORMAL HIGH (ref 6–23)
BUN: 43 mg/dL — ABNORMAL HIGH (ref 6–23)
BUN: 53 mg/dL — ABNORMAL HIGH (ref 6–23)
CO2: 26 mEq/L (ref 19–32)
CO2: 26 mEq/L (ref 19–32)
CO2: 26 mEq/L (ref 19–32)
CO2: 34 mEq/L — ABNORMAL HIGH (ref 19–32)
CO2: 38 mEq/L — ABNORMAL HIGH (ref 19–32)
CO2: 43 mEq/L — ABNORMAL HIGH (ref 19–32)
Calcium: 7.3 mg/dL — ABNORMAL LOW (ref 8.4–10.5)
Calcium: 7.9 mg/dL — ABNORMAL LOW (ref 8.4–10.5)
Calcium: 8.5 mg/dL (ref 8.4–10.5)
Calcium: 8.5 mg/dL (ref 8.4–10.5)
Calcium: 8.8 mg/dL (ref 8.4–10.5)
Calcium: 9.1 mg/dL (ref 8.4–10.5)
Chloride: 103 mEq/L (ref 96–112)
Chloride: 103 mEq/L (ref 96–112)
Chloride: 107 mEq/L (ref 96–112)
Chloride: 108 mEq/L (ref 96–112)
Chloride: 96 mEq/L (ref 96–112)
Chloride: 98 mEq/L (ref 96–112)
Creatinine, Ser: 1.33 mg/dL (ref 0.4–1.5)
Creatinine, Ser: 1.86 mg/dL — ABNORMAL HIGH (ref 0.4–1.5)
Creatinine, Ser: 2.13 mg/dL — ABNORMAL HIGH (ref 0.4–1.5)
Creatinine, Ser: 2.64 mg/dL — ABNORMAL HIGH (ref 0.4–1.5)
Creatinine, Ser: 6.11 mg/dL — ABNORMAL HIGH (ref 0.4–1.5)
Creatinine, Ser: 6.83 mg/dL — ABNORMAL HIGH (ref 0.4–1.5)
GFR calc Af Amer: 12 mL/min — ABNORMAL LOW (ref >60)
GFR calc Af Amer: 12 mL/min — ABNORMAL LOW (ref >60)
GFR calc Af Amer: 14 mL/min — ABNORMAL LOW (ref >60)
GFR calc Af Amer: 24 mL/min — ABNORMAL LOW (ref >60)
GFR calc Af Amer: 31 mL/min — ABNORMAL LOW (ref >60)
GFR calc non Af Amer: 10 mL/min — ABNORMAL LOW (ref >60)
GFR calc non Af Amer: 13 mL/min — ABNORMAL LOW (ref >60)
GFR calc non Af Amer: 14 mL/min — ABNORMAL LOW (ref >60)
GFR calc non Af Amer: 20 mL/min — ABNORMAL LOW (ref >60)
GFR calc non Af Amer: 20 mL/min — ABNORMAL LOW (ref >60)
GFR calc non Af Amer: 33 mL/min — ABNORMAL LOW (ref >60)
Glucose, Bld: 103 mg/dL — ABNORMAL HIGH (ref 70–99)
Glucose, Bld: 110 mg/dL — ABNORMAL HIGH (ref 70–99)
Glucose, Bld: 120 mg/dL — ABNORMAL HIGH (ref 70–99)
Glucose, Bld: 139 mg/dL — ABNORMAL HIGH (ref 70–99)
Glucose, Bld: 151 mg/dL — ABNORMAL HIGH (ref 70–99)
Glucose, Bld: 82 mg/dL (ref 70–99)
Glucose, Bld: 83 mg/dL (ref 70–99)
Glucose, Bld: 89 mg/dL (ref 70–99)
Glucose, Bld: 99 mg/dL (ref 70–99)
Potassium: 3.5 mEq/L (ref 3.5–5.1)
Potassium: 3.9 mEq/L (ref 3.5–5.1)
Potassium: 3.9 mEq/L (ref 3.5–5.1)
Potassium: 3.9 mEq/L (ref 3.5–5.1)
Sodium: 137 mEq/L (ref 135–145)
Sodium: 139 mEq/L (ref 135–145)
Sodium: 144 mEq/L (ref 135–145)
Sodium: 145 mEq/L (ref 135–145)
Sodium: 147 mEq/L — ABNORMAL HIGH (ref 135–145)
Sodium: 148 mEq/L — ABNORMAL HIGH (ref 135–145)
Sodium: 153 mEq/L — ABNORMAL HIGH (ref 135–145)
Total Bilirubin: 1.3 mg/dL — ABNORMAL HIGH (ref 0.3–1.2)
Total Bilirubin: 1.6 mg/dL — ABNORMAL HIGH (ref 0.3–1.2)
Total Bilirubin: 2.2 mg/dL — ABNORMAL HIGH (ref 0.3–1.2)
Total Bilirubin: 2.3 mg/dL — ABNORMAL HIGH (ref 0.3–1.2)
Total Bilirubin: 2.6 mg/dL — ABNORMAL HIGH (ref 0.3–1.2)
Total Bilirubin: 2.6 mg/dL — ABNORMAL HIGH (ref 0.3–1.2)
Total Bilirubin: 5.2 mg/dL — ABNORMAL HIGH (ref 0.3–1.2)
Total Bilirubin: 6.4 mg/dL — ABNORMAL HIGH (ref 0.3–1.2)
Total Protein: 6.1 g/dL (ref 6.0–8.3)
Total Protein: 6.7 g/dL (ref 6.0–8.3)
Total Protein: 6.8 g/dL (ref 6.0–8.3)
Total Protein: 7.4 g/dL (ref 6.0–8.3)
Total Protein: 7.4 g/dL (ref 6.0–8.3)
Total Protein: 7.7 g/dL (ref 6.0–8.3)

## 2010-07-18 LAB — CBC
HCT: 32.5 % — ABNORMAL LOW (ref 39.0–52.0)
HCT: 36.9 % — ABNORMAL LOW (ref 39.0–52.0)
HCT: 46.7 % (ref 39.0–52.0)
Hemoglobin: 10.9 g/dL — ABNORMAL LOW (ref 13.0–17.0)
Hemoglobin: 11 g/dL — ABNORMAL LOW (ref 13.0–17.0)
Hemoglobin: 6.9 g/dL — CL (ref 13.0–17.0)
Hemoglobin: 7.6 g/dL — ABNORMAL LOW (ref 13.0–17.0)
Hemoglobin: 8.9 g/dL — ABNORMAL LOW (ref 13.0–17.0)
Hemoglobin: 9.3 g/dL — ABNORMAL LOW (ref 13.0–17.0)
Hemoglobin: 9.9 g/dL — ABNORMAL LOW (ref 13.0–17.0)
MCHC: 33.3 g/dL (ref 30.0–36.0)
MCHC: 33.3 g/dL (ref 30.0–36.0)
MCHC: 33.6 g/dL (ref 30.0–36.0)
MCHC: 33.7 g/dL (ref 30.0–36.0)
MCHC: 33.8 g/dL (ref 30.0–36.0)
MCHC: 34 g/dL (ref 30.0–36.0)
MCHC: 34.2 g/dL (ref 30.0–36.0)
MCV: 103.8 fL — ABNORMAL HIGH (ref 78.0–100.0)
MCV: 104 fL — ABNORMAL HIGH (ref 78.0–100.0)
MCV: 105.2 fL — ABNORMAL HIGH (ref 78.0–100.0)
MCV: 105.6 fL — ABNORMAL HIGH (ref 78.0–100.0)
MCV: 106.7 fL — ABNORMAL HIGH (ref 78.0–100.0)
MCV: 107.5 fL — ABNORMAL HIGH (ref 78.0–100.0)
MCV: 109.4 fL — ABNORMAL HIGH (ref 78.0–100.0)
Platelets: 177 10*3/uL (ref 150–400)
Platelets: 212 10*3/uL (ref 150–400)
Platelets: 216 10*3/uL (ref 150–400)
RBC: 1.84 MIL/uL — ABNORMAL LOW (ref 4.22–5.81)
RBC: 2.06 MIL/uL — ABNORMAL LOW (ref 4.22–5.81)
RBC: 2.41 MIL/uL — ABNORMAL LOW (ref 4.22–5.81)
RBC: 2.72 MIL/uL — ABNORMAL LOW (ref 4.22–5.81)
RBC: 3.51 MIL/uL — ABNORMAL LOW (ref 4.22–5.81)
RDW: 12.7 % (ref 11.5–15.5)
RDW: 12.7 % (ref 11.5–15.5)
RDW: 12.7 % (ref 11.5–15.5)
RDW: 12.8 % (ref 11.5–15.5)
RDW: 15.6 % — ABNORMAL HIGH (ref 11.5–15.5)
WBC: 14.6 10*3/uL — ABNORMAL HIGH (ref 4.0–10.5)
WBC: 15.9 10*3/uL — ABNORMAL HIGH (ref 4.0–10.5)
WBC: 17.2 10*3/uL — ABNORMAL HIGH (ref 4.0–10.5)
WBC: 20.6 10*3/uL — ABNORMAL HIGH (ref 4.0–10.5)
WBC: 22.6 10*3/uL — ABNORMAL HIGH (ref 4.0–10.5)
WBC: 23.8 10*3/uL — ABNORMAL HIGH (ref 4.0–10.5)
WBC: 24.9 10*3/uL — ABNORMAL HIGH (ref 4.0–10.5)

## 2010-07-18 LAB — POCT I-STAT 3, ART BLOOD GAS (G3+)
Acid-Base Excess: 14 mmol/L — ABNORMAL HIGH (ref 0.0–2.0)
Acid-Base Excess: 14 mmol/L — ABNORMAL HIGH (ref 0.0–2.0)
Acid-Base Excess: 5 mmol/L — ABNORMAL HIGH (ref 0.0–2.0)
Bicarbonate: 27.4 mEq/L — ABNORMAL HIGH (ref 20.0–24.0)
Bicarbonate: 35.8 mEq/L — ABNORMAL HIGH (ref 20.0–24.0)
O2 Saturation: 100 %
O2 Saturation: 97 %
O2 Saturation: 98 %
TCO2: 28 mmol/L (ref 0–100)
TCO2: 49 mmol/L (ref 0–100)
pCO2 arterial: 28.5 mmHg — ABNORMAL LOW (ref 35.0–45.0)
pCO2 arterial: 30.7 mmHg — ABNORMAL LOW (ref 35.0–45.0)
pCO2 arterial: 36.6 mmHg (ref 35.0–45.0)
pCO2 arterial: 62.5 mmHg (ref 35.0–45.0)
pCO2 arterial: 66.4 mmHg (ref 35.0–45.0)
pCO2 arterial: 69.3 mmHg (ref 35.0–45.0)
pO2, Arterial: 123 mmHg — ABNORMAL HIGH (ref 80.0–100.0)
pO2, Arterial: 71 mmHg — ABNORMAL LOW (ref 80.0–100.0)
pO2, Arterial: 72 mmHg — ABNORMAL LOW (ref 80.0–100.0)
pO2, Arterial: 92 mmHg (ref 80.0–100.0)

## 2010-07-18 LAB — MRSA PCR SCREENING: MRSA by PCR: NEGATIVE

## 2010-07-18 LAB — CREATININE, URINE, RANDOM
Creatinine, Urine: 16.7 mg/dL
Creatinine, Urine: 44.7 mg/dL

## 2010-07-18 LAB — GLUCOSE, CAPILLARY
Glucose-Capillary: 101 mg/dL — ABNORMAL HIGH (ref 70–99)
Glucose-Capillary: 103 mg/dL — ABNORMAL HIGH (ref 70–99)
Glucose-Capillary: 111 mg/dL — ABNORMAL HIGH (ref 70–99)
Glucose-Capillary: 113 mg/dL — ABNORMAL HIGH (ref 70–99)
Glucose-Capillary: 117 mg/dL — ABNORMAL HIGH (ref 70–99)
Glucose-Capillary: 120 mg/dL — ABNORMAL HIGH (ref 70–99)
Glucose-Capillary: 121 mg/dL — ABNORMAL HIGH (ref 70–99)
Glucose-Capillary: 122 mg/dL — ABNORMAL HIGH (ref 70–99)
Glucose-Capillary: 129 mg/dL — ABNORMAL HIGH (ref 70–99)
Glucose-Capillary: 136 mg/dL — ABNORMAL HIGH (ref 70–99)
Glucose-Capillary: 136 mg/dL — ABNORMAL HIGH (ref 70–99)
Glucose-Capillary: 138 mg/dL — ABNORMAL HIGH (ref 70–99)
Glucose-Capillary: 165 mg/dL — ABNORMAL HIGH (ref 70–99)
Glucose-Capillary: 80 mg/dL (ref 70–99)
Glucose-Capillary: 88 mg/dL (ref 70–99)
Glucose-Capillary: 90 mg/dL (ref 70–99)
Glucose-Capillary: 90 mg/dL (ref 70–99)
Glucose-Capillary: 91 mg/dL (ref 70–99)
Glucose-Capillary: 92 mg/dL (ref 70–99)
Glucose-Capillary: 92 mg/dL (ref 70–99)
Glucose-Capillary: 92 mg/dL (ref 70–99)
Glucose-Capillary: 99 mg/dL (ref 70–99)
Glucose-Capillary: 99 mg/dL (ref 70–99)

## 2010-07-18 LAB — MAGNESIUM
Magnesium: 2.2 mg/dL (ref 1.5–2.5)
Magnesium: 2.2 mg/dL (ref 1.5–2.5)
Magnesium: 2.2 mg/dL (ref 1.5–2.5)
Magnesium: 2.3 mg/dL (ref 1.5–2.5)
Magnesium: 2.3 mg/dL (ref 1.5–2.5)
Magnesium: 2.6 mg/dL — ABNORMAL HIGH (ref 1.5–2.5)
Magnesium: 2.6 mg/dL — ABNORMAL HIGH (ref 1.5–2.5)
Magnesium: 3.1 mg/dL — ABNORMAL HIGH (ref 1.5–2.5)

## 2010-07-18 LAB — BASIC METABOLIC PANEL
BUN: 28 mg/dL — ABNORMAL HIGH (ref 6–23)
BUN: 36 mg/dL — ABNORMAL HIGH (ref 6–23)
Calcium: 7 mg/dL — ABNORMAL LOW (ref 8.4–10.5)
Calcium: 8.3 mg/dL — ABNORMAL LOW (ref 8.4–10.5)
Chloride: 104 mEq/L (ref 96–112)
Chloride: 109 mEq/L (ref 96–112)
Creatinine, Ser: 1.45 mg/dL (ref 0.4–1.5)
Creatinine, Ser: 2.52 mg/dL — ABNORMAL HIGH (ref 0.4–1.5)
Creatinine, Ser: 4.84 mg/dL — ABNORMAL HIGH (ref 0.4–1.5)
GFR calc Af Amer: 33 mL/min — ABNORMAL LOW (ref >60)
GFR calc non Af Amer: 13 mL/min — ABNORMAL LOW (ref >60)
GFR calc non Af Amer: 27 mL/min — ABNORMAL LOW (ref >60)
GFR calc non Af Amer: 51 mL/min — ABNORMAL LOW (ref >60)
Glucose, Bld: 106 mg/dL — ABNORMAL HIGH (ref 70–99)
Sodium: 141 mEq/L (ref 135–145)

## 2010-07-18 LAB — CK TOTAL AND CKMB (NOT AT ARMC)
CK, MB: 15.8 ng/mL — ABNORMAL HIGH (ref 0.3–4.0)
CK, MB: 24 ng/mL — ABNORMAL HIGH (ref 0.3–4.0)
CK, MB: 470 ng/mL — ABNORMAL HIGH (ref 0.3–4.0)
CK, MB: >1000 ng/mL — ABNORMAL HIGH (ref 0.3–4.0)
Relative Index: 0.5 (ref 0.0–2.5)
Relative Index: 0.6 (ref 0.0–2.5)
Total CK: 3098 U/L — ABNORMAL HIGH (ref 7–232)
Total CK: 4333 U/L — ABNORMAL HIGH (ref 7–232)
Total CK: >50000 U/L — ABNORMAL HIGH (ref 7–232)
Total CK: >50000 U/L — ABNORMAL HIGH (ref 7–232)

## 2010-07-18 LAB — RAPID URINE DRUG SCREEN, HOSP PERFORMED
Amphetamines: NOT DETECTED
Cocaine: NOT DETECTED
Opiates: POSITIVE — AB
Tetrahydrocannabinol: NOT DETECTED

## 2010-07-18 LAB — HEPATITIS B CORE ANTIBODY, IGM

## 2010-07-18 LAB — HEPATITIS B CORE ANTIBODY, TOTAL: Hep B Core Total Ab: POSITIVE — AB

## 2010-07-18 LAB — LIPASE, BLOOD: Lipase: 26 U/L (ref 11–59)

## 2010-07-18 LAB — URINE MICROSCOPIC-ADD ON

## 2010-07-18 LAB — CARDIAC PANEL(CRET KIN+CKTOT+MB+TROPI)
CK, MB: 14.5 ng/mL — ABNORMAL HIGH (ref 0.3–4.0)
CK, MB: 14.5 ng/mL — ABNORMAL HIGH (ref 0.3–4.0)
CK, MB: 8.6 ng/mL — ABNORMAL HIGH (ref 0.3–4.0)
Relative Index: 0.5 (ref 0.0–2.5)
Relative Index: 0.6 (ref 0.0–2.5)
Total CK: 1460 U/L — ABNORMAL HIGH (ref 7–232)
Total CK: 2382 U/L — ABNORMAL HIGH (ref 7–232)
Total CK: 2816 U/L — ABNORMAL HIGH (ref 7–232)
Troponin I: 0.1 ng/mL — ABNORMAL HIGH (ref 0.00–0.06)
Troponin I: 0.1 ng/mL — ABNORMAL HIGH (ref 0.00–0.06)
Troponin I: 0.2 ng/mL — ABNORMAL HIGH (ref 0.00–0.06)

## 2010-07-18 LAB — PHOSPHORUS
Phosphorus: 1.9 mg/dL — ABNORMAL LOW (ref 2.3–4.6)
Phosphorus: 2.4 mg/dL (ref 2.3–4.6)
Phosphorus: 3.9 mg/dL (ref 2.3–4.6)
Phosphorus: 6 mg/dL — ABNORMAL HIGH (ref 2.3–4.6)

## 2010-07-18 LAB — URINALYSIS, ROUTINE W REFLEX MICROSCOPIC
Glucose, UA: NEGATIVE mg/dL
Glucose, UA: NEGATIVE mg/dL
Leukocytes, UA: NEGATIVE
Protein, ur: 100 mg/dL — AB
Specific Gravity, Urine: 1.011 (ref 1.005–1.030)
Specific Gravity, Urine: 1.017 (ref 1.005–1.030)
pH: 6 (ref 5.0–8.0)
pH: 8.5 — ABNORMAL HIGH (ref 5.0–8.0)

## 2010-07-18 LAB — PROTIME-INR
INR: 0.91 (ref 0.00–1.49)
INR: 1.07 (ref 0.00–1.49)
Prothrombin Time: 12.7 seconds (ref 11.6–15.2)

## 2010-07-18 LAB — CULTURE, BAL-QUANTITATIVE W GRAM STAIN

## 2010-07-18 LAB — URINE CULTURE
Colony Count: NO GROWTH
Culture: NO GROWTH

## 2010-07-18 LAB — BRAIN NATRIURETIC PEPTIDE
Pro B Natriuretic peptide (BNP): 128 pg/mL — ABNORMAL HIGH (ref 0.0–100.0)
Pro B Natriuretic peptide (BNP): 725 pg/mL — ABNORMAL HIGH (ref 0.0–100.0)

## 2010-07-18 LAB — CROSSMATCH
ABO/RH(D): B POS
Antibody Screen: NEGATIVE

## 2010-07-18 LAB — POCT I-STAT, CHEM 8
Chloride: 106 mEq/L (ref 96–112)
Creatinine, Ser: 1.5 mg/dL (ref 0.4–1.5)
Glucose, Bld: 99 mg/dL (ref 70–99)
Potassium: 6.5 mEq/L (ref 3.5–5.1)

## 2010-07-18 LAB — CK
Total CK: 1841 U/L — ABNORMAL HIGH (ref 7–232)
Total CK: 8157 U/L — ABNORMAL HIGH (ref 7–232)
Total CK: >50000 U/L — ABNORMAL HIGH (ref 7–232)

## 2010-07-18 LAB — DIFFERENTIAL
Basophils Absolute: 0 10*3/uL (ref 0.0–0.1)
Lymphocytes Relative: 6 % — ABNORMAL LOW (ref 12–46)
Monocytes Absolute: 0.8 10*3/uL (ref 0.1–1.0)
Neutro Abs: 12.9 10*3/uL — ABNORMAL HIGH (ref 1.7–7.7)

## 2010-07-18 LAB — ACETAMINOPHEN LEVEL: Acetaminophen (Tylenol), Serum: <10 ug/mL — ABNORMAL LOW (ref 10–30)

## 2010-07-18 LAB — SODIUM, URINE, RANDOM: Sodium, Ur: 94 mEq/L

## 2010-07-18 LAB — APTT
aPTT: 31 seconds (ref 24–37)
aPTT: 36 seconds (ref 24–37)

## 2010-07-18 LAB — CARBOXYHEMOGLOBIN
Carboxyhemoglobin: 3.5 % — ABNORMAL HIGH (ref 0.5–1.5)
O2 Saturation: 62.3 %
Total hemoglobin: 9.9 g/dL — ABNORMAL LOW (ref 13.5–18.0)

## 2010-07-18 LAB — ALT: ALT: 82 U/L — ABNORMAL HIGH (ref 0–53)

## 2010-07-18 LAB — POTASSIUM: Potassium: 5.4 mEq/L — ABNORMAL HIGH (ref 3.5–5.1)

## 2010-07-18 LAB — AMMONIA: Ammonia: 51 umol/L — ABNORMAL HIGH (ref 11–35)

## 2010-07-18 LAB — HEPATITIS A ANTIBODY, IGM: Hep A IgM: NEGATIVE

## 2010-08-01 LAB — COMPREHENSIVE METABOLIC PANEL
AST: 725 U/L — ABNORMAL HIGH (ref 0–37)
Albumin: 2.9 g/dL — ABNORMAL LOW (ref 3.5–5.2)
BUN: 10 mg/dL (ref 6–23)
Chloride: 100 mEq/L (ref 96–112)
Creatinine, Ser: 0.54 mg/dL (ref 0.4–1.5)
GFR calc Af Amer: >60 mL/min (ref >60)
Total Protein: 8 g/dL (ref 6.0–8.3)

## 2010-08-01 LAB — DIFFERENTIAL
Eosinophils Relative: 2 % (ref 0–5)
Lymphocytes Relative: 27 % (ref 12–46)
Lymphs Abs: 2.2 10*3/uL (ref 0.7–4.0)
Monocytes Absolute: 1.1 10*3/uL — ABNORMAL HIGH (ref 0.1–1.0)
Monocytes Relative: 13 % — ABNORMAL HIGH (ref 3–12)
Neutro Abs: 4.7 10*3/uL (ref 1.7–7.7)

## 2010-08-01 LAB — URINALYSIS, ROUTINE W REFLEX MICROSCOPIC
Glucose, UA: NEGATIVE mg/dL
Ketones, ur: 15 mg/dL — AB
Nitrite: POSITIVE — AB
Specific Gravity, Urine: 1.041 — ABNORMAL HIGH (ref 1.005–1.030)
pH: 5 (ref 5.0–8.0)

## 2010-08-01 LAB — CBC
HCT: 40.5 % (ref 39.0–52.0)
MCV: 105.2 fL — ABNORMAL HIGH (ref 78.0–100.0)
Platelets: 156 10*3/uL (ref 150–400)
RDW: 13 % (ref 11.5–15.5)
WBC: 8.3 10*3/uL (ref 4.0–10.5)

## 2010-08-01 LAB — URINE MICROSCOPIC-ADD ON

## 2010-08-30 NOTE — Consult Note (Signed)
NAMEGOLDIE, DIMMER             ACCOUNT NO.:  [id number]   MEDICAL RECORD NO.:  [id number]          PATIENT TYPE:  INP   LOCATION:  5715                         FACILITY:  [facility]   PHYSICIAN:  James L. Randa Evens, M.D. DATE OF BIRTH:  15-May-1955   DATE OF CONSULTATION:  12/20/2006  DATE OF DISCHARGE:                                 CONSULTATION   REASON FOR CONSULTATION:  We were asked to see Mr. Krebbs today in  consultation for a positive hepatitis C antibody by [facility].   This is a 55 year old pleasant homeless man who states he has been  feeling bad for about six months with increased fatigue.  Last  Wednesday he started vomiting food and fluid but no blood.  He vomited  until Sunday.  He has been having brown watery diarrhea three times a  day since Sunday.  He has epigastric pain as well.  He states that it is  intermittent shooting.  He has been experiencing it since the morning of  August 31, Sunday.  He has had no recent confusion or mental status  changes.  No jaundice or icterus.  He states he could have possibly used  IV drugs many years ago.  He has no tattoos.  He drinks beer daily.  He  has occasional drinks of hard liquor.  The patient had a temperature 101  on September 2 in the hospital, but currently is afebrile.   PAST MEDICAL HISTORY:  None.   HOME MEDICATIONS:  None.   ALLERGIES:  He has no known drug allergies.   REVIEW OF SYSTEMS:  He is complaining of right hip pain from a recent  fall.   SOCIAL HISTORY:  He lives at the shelter.  He is positive for tobacco,  positive for alcohol.  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   FAMILY HISTORY:  His brother passed away with cirrhosis of the liver,  but he has no known history of colon cancer in his family.   PHYSICAL EXAMINATION:  GENERAL:  He is alert and oriented in no apparent  distress.  VITAL SIGNS:  Current temperature is 97.5, respirations 21, pulse 72,  blood pressure 113/80.  CARDIOVASCULAR:  Has a regular rate and rhythm.  LUNGS:  Clear to auscultation anteriorly.  ABDOMEN:  Soft, mildly distended.  He has good bowel sounds.   LABORATORY DATA:  His labs do show a positive hepatitis C antibody.  Current sodium 130.  Current hemoglobin 15.3 over hematocrit 43.9, white  count 10, platelets 183.  On September 4 AST 245, ALT 144.  Alk phos 67,  total bilirubin 1.7.  On August 31 AST was 918, ALT 255.  Alk phos 89,  total bilirubin 1.9.  Amylase and lipase are normal.  Sputum cultures  showed abundant white blood cells.   CT done on August 31 showed probable proximal small bowel obstruction,  mild fatty liver, and mild distention of the stomach.  Abdominal  ultrasound done on September 2 showed gallbladder with sludge.  Chest x-  ray on September 3 showed  right base infiltrate, possible pneumonia,  increased vascular congestion.  On September 4 his abdominal x-ray  showed a mildly dilated small bowel and air-fluid levels, no free air.   ASSESSMENT:  Dr. Carman Ching has seen and examined the patient,  collected a history, and reviewed his records.  He states that this is a  55 year old male who had a possible bout of gastroenteritis followed by  ileus versus small-bowel obstruction, positive hepatitis C antibody.  Will check hepatitis C, RNA, picture confusing.  The patient appears to  be feeling better.  Course unusual for hepatitis C. Thanks very much for  this consultation.  We will follow with you.      Stephani Police, PA    ______________________________  Llana Aliment Randa Evens, M.D.    MLY/MEDQ  D:  12/24/2006  T:  12/25/2006  Job:  [id number]   cc:   Fayrene Fearing L. Malon Kindle., M.D.  [facility]

## 2010-08-30 NOTE — Discharge Summary (Signed)
NAMELONALD, TROIANI             ACCOUNT NO.:  [id number]   MEDICAL RECORD NO.:  [id number]          PATIENT TYPE:  INP   LOCATION:  1508                         FACILITY:  [facility]   PHYSICIAN:  Estelle Grumbles, MDDATE OF BIRTH:  [birth year]   DATE OF ADMISSION:  12/17/2007  DATE OF DISCHARGE:  12/31/2007                               DISCHARGE SUMMARY   DISPOSITION:  Assisted living facility.   ADDENDUM:  This is an addendum to the previous discharge summary done on  December 23, 2007.  For complete details, please review the discharge  summary on December 23, 2007.   DISCHARGE MEDICATIONS:  1. Ensure 1 can p.o. twice a day.  2. Folic acid 1 tablet daily.  3. Lasix 20 mg orally daily.  4. Multivitamin 1 tablet daily.  5. Protonix 40 mg daily.  6. Prilosec 20 mg orally twice a day.  7. Ultram 50 mg orally 3 times a day as needed for pain.  8. Dulcolax 10 mg orally daily as needed for constipation.   HOSPITAL COURSE:  Mr. Stiehl continued to improve.  He was able to  tolerate the diet.  Parental nutrition was weaned off.  He completed  antibiotic course for pneumonia.  Currently, he is tolerating mechanical  soft 2 diet.  In view of his severe deconditioning, the patient required  facility placement.  The patient was homeless.  Does not have any  insurance.  Social worker currently able to arrange __________care at  assisted living facility for 14 days.  Subsequently, [facility]  to follow the patient for physical therapy and occupational therapy.   FOLLOW UP:  1. The patient does not have any family care physician.  I would      recommend to follow up with Dr. Bosie Clos, gastroenterologist as      outpatient.  2. Also to follow up at [facility] as outpatient.      Estelle Grumbles, MD  Electronically Signed     TP/MEDQ  D:  12/31/2007  T:  12/31/2007  Job:  [id number]

## 2010-08-30 NOTE — Consult Note (Signed)
NAMEMARTEZ, Ethan Jordan             ACCOUNT NO.:  [id number]   MEDICAL RECORD NO.:  [id number]          PATIENT TYPE:  EMS   LOCATION:  MAJO                         FACILITY:  [facility]   PHYSICIAN:  Ardeth Sportsman, MD     DATE OF BIRTH:  01-Oct-1955   DATE OF CONSULTATION:  DATE OF DISCHARGE:                                 CONSULTATION   GENERAL SURGERY CONSULTATION:   REASON FOR CONSULTATION:  Nausea, vomiting, abdominal pain and jejunal  dilation.   HISTORY OF PRESENT ILLNESS:  Ethan Jordan is a 55 year old male who  claims to be otherwise pretty healthy who last night started having some  abdominal pain, nausea and vomiting.  It was somewhat bilious but no  hematemesis or hematochezia or melena.  He has never had anything like  this before.  No change in diet.  No sick contacts or travel history.  No history of irritable bowel syndrome or inflammatory bowel disease.  No history of any other GI problems.  No history of any prior surgery.   He tried to switch over just to liquids, but when he could not even keep  that down he came to the emergency department and was evaluated.  Evaluation by CT scan showed some dilated proximal jejunal loops,  basically concerned of possible small bowel obstruction or other  abnormalities.  Dr. Radford Pax requested consult with surgery and I came.   PAST MEDICAL HISTORY:  Negative.   PAST SURGICAL HISTORY:  Negative.   SOCIAL HISTORY:  He has probably about a 20-pack-year history of  tobacco.  Currently he smokes 1/2 pack per day.  Occasionally he drinks  some alcohol.  Denies any drug use.   ALLERGIES:  NONE.   MEDICATIONS:  None.   FAMILY HISTORY:  Negative for any GI abnormalities.  No history of  inflammatory bowel disease or cancer.   REVIEW OF SYSTEMS:  As per HPI.  CONSTITUTIONAL:  He has had a little  bit of weight loss and decreased appetite over the past few weeks, but  it has been relatively mild.  He has been having no fevers, chills  or  sweats.  Ophthalmologic, ENT, cardiac, respiratory, hepatic, renal and  endocrine are otherwise negative.  His urine has been a little bit dark  the past couple of days, but he has not had decreased urine output.  Musculoskeletal, neurological, psychiatric, hematologic, lymphatic,  allergic otherwise negative.  GI:  As noted above.  He has been having  bowel movements every day, but no bad bouts of diarrhea.  He has been  having flatus, although he cannot recall if he has had any today.   PHYSICAL EXAMINATION:  VITAL SIGNS:  Temperature of 98.3, initially he  had 4/10 pain, pulse 74, respirations 18, blood pressure 127/76, 95% on  room air.  GENERAL:  In general, he is a well-developed, well-nourished, thin male  in no acute distress.  HEENT:  Normocephalic with no facial asymmetry.  Mucous membranes are  dry.  The nasopharynx and oropharynx are clear.  He has an NG tube in  place.  NECK:  Supple.  No masses.  Trachea is midline.  HEART:  Regular rate and rhythm.  No murmurs, gallops or rubs.  CHEST:  Clear to auscultation bilaterally.  No wheezes, rales or  rhonchi.  EYES:  Pupils equal, round and reactive to light.  Extraocular movements  are intact.  His sclerae are not icteric or injected.  LYMPHATICS:  No head, neck, axillary or groin lymphadenopathy.  BREASTS:  No sores or lesions.  No breast discharge.  ABDOMEN:  Is slightly distended but soft.  He has some mild epigastric  greater than mid abdominal discomfort but no rebound tenderness or  diffuse peritonitis.  He has a small umbilical hernia, but it is not  even incarcerated.  Nothing is within it and it is nontender.  GENITALIA:  Normal external male genitalia with no inguinal hernias.  RECTAL:  Was refused per patient request.  EXTREMITIES:  No clubbing, cyanosis or edema.  MUSCULOSKELETAL:  Full range of motion shoulders, elbows, wrists, hips,  knees and ankles.   STUDIES:  His white count is 6.8 with no left shift.   Hemoglobin 15.1,  potassium 3.6, creatinine 0.68.  His total bilirubin was 1.9, alkaline  phosphatase is 89, his AST is 918 and ALT is 255.  EKG is negative.  Chest x-ray is negative.  CT scan of the abdomen and pelvis shows  dilated stomach and proximal jejunum with some proximal jejunal wall  thickening.  It seems to switch over to normal loops in his right upper  quadrant.  There is some question of internal hernia, but the  radiologists & I do not really see this.  There is no definite twist or  torsion or closed loop.  Contrast does go into normal colon with gas  within it as well.  There is no free fluid or free air.   ASSESSMENT AND PLAN:  This is a 55 year old male with no prior surgery  with dilated proximal loop of jejunum.  1. Admit.  2. Continue nasogastric tube.  3. Intravenous fluids.  4. Ulcer prophylaxis.  5. Deep venous thrombosis prophylaxis with hose.  6. Elevated liver function tests.  We will hydrate and follow      expectantly.  If it remains elevated, we will consider      gastroenterological or medicine workup.  7. Some increase in myoglobin.  No history of any trauma or falls.      Keep him well hydrated and follow expectantly.      Ardeth Sportsman, MD  Electronically Signed     SCG/MEDQ  D:  12/16/2006  T:  12/16/2006  Job:  [id number]

## 2010-08-30 NOTE — Group Therapy Note (Signed)
NAMELONI, Ethan Jordan             ACCOUNT NO.:  [id number]   MEDICAL RECORD NO.:  [id number]          PATIENT TYPE:  INP   LOCATION:  1508                         FACILITY:  [facility]   PHYSICIAN:  Isidor Holts, M.D.  DATE OF BIRTH:  [birth year]                                 PROGRESS NOTE   DATE OF DISCHARGE:  To be determined.   PRIMARY MEDICAL DOCTOR:  Unassigned.   DISCHARGE DIAGNOSES:  1. Right lower lobe pneumonia secondary to Haemophilus influenzae with      superadded aspiration.  2. Acute gastroduodenitis/enteritis, of presumed infective origin.  3. Partial small bowel obstruction, secondary to small bowel edema.  4. Malnutrition.  5. Chronic alcohol abuse/acute alcoholic intoxication/withdrawal.  6. History of hepatitis C.  7. Smoking history.  8. Diarrhea.   DISCHARGE MEDICATIONS:  These will be listed in addendum at the appropriate time, by discharging  MD.   PROCEDURES:  1. Abdominal/chest x-ray dated December 17, 2007, this showed no      acute cardiopulmonary findings, nonspecific bowel gas pattern.  No      plain film findings of obstruction or perforation.  Cannot exclude      gastroenteritis or very early small bowel obstruction.  Avascular      necrosis of the right hip with associated severe degenerative      disease.  2  CT abdomen/pelvis dated December 18, 2007, this showed the bowel  wall erythema of large portion of the distal jejunum and ileum,  nonspecific finding.  Differential including infectious and inflammatory  etiologies.  No evidence of pneumatosis or perforation.  Cholelithiasis  without evidence of cholecystitis.  Concern for right lower lobe  pneumonia.  There was a small amount of free fluid within the pelvis  which may be related to the small bowel wall edema described.  1. Abdominal ultrasound scan dated December 19, 2007, this showed      cholelithiasis and gallbladder sludge.  No sonographic evidence of      acute cholecystitis  or biliary duct dilatation.  Mild diffuse fatty      infiltration of the liver without focal hepatic abnormality.      Otherwise normal abdominal ultrasound.  2. Chest x-ray dated December 20, 2007, this showed right upper      extremity approach PICC line catheter tip at the level of the SVC,      probable basilar atelectasis.  3. Abdominal x-ray dated December 20, 2007, this showed dilated      proximal small bowel, partial small-bowel obstruction is not      excluded.  No free intraperitoneal gas.  4. Two-view chest x-ray dated December 22, 2007, this showed small      bilateral effusions and bibasilar atelectasis.  5. Abdominal x-ray dated December 22, 2007, this showed      nonobstructive bowel gas pattern, gas-filled loops of small and      large bowel are scattered across the abdomen.  6. A 2-D echocardiogram dated December 18, 2007, this showed overall      normal left ventricular systolic function, EF 55%.  No diagnostic  evidence of left ventricular regional wall motion abnormalities.      Left ventricular wall thickness mildly increased.  There was mild      asymmetric septal hypertrophy.  Features were consistent with mild      diastolic dysfunction.  Estimated peak pulmonary artery systolic      pressure was mildly increased.   CONSULTATIONS:  Dr. Charlott Rakes, gastroenterologist.   ADMISSION HISTORY:  Of H&P notes of December 17, 2007, dictated by [facility]. However, in  brief, this is a 55 year old male, with known history of hepatitis C,  chronic alcohol abuse, smoking history and homelessness, presenting with  abdominal pain and shortness of breath.  Reportedly, the abdominal pain  started after he ate some bologna.  The pain progressively got worse and  was associated with nausea and nonbilious, nonbloody vomiting, without  diarrhea.  He also had mild shortness of breath, and cough productive of  brownish phlegm.  Denies fever or chills.  He was  admitted for further  evaluation, investigation and management.   CLINICAL COURSE:  1. Acute gastroduodenitis.  For details of presentation, refer to      admission history above.  The patient was managed with bowel rest,      intravenous fluid hydration, proton pump inhibitor treatment and      antiemetics.  Abdominal CT scan was done.  For details of findings,      refer to procedure list above.  This showed bowel wall edema of a      large portion of the distal jejunum and ileum, necessitating      requesting a GI consultation, which was kindly provided by Dr.      Charlott Rakes.  For details of his consultation, refer to      consultation notes of December 18, 2007.  He recommended bowel      rest, management as outlined above and also antibiotic therapy.      The patient was therefore managed with a combination of Levaquin      and Flagyl, and for nutritional purposes, he was commenced on      parenteral TNA.  Over the course of his hospitalization, abdominal      pain ameliorated, vomiting resolved, overall wellbeing improved,      and WBC showed steady diminution.   1. Partial small bowel obstruction.  This was secondary to #1 above,      and was confirmed by abdominal imaging studies, including abdominal      CT scan, as well as x-rays.  With above-mentioned management      measures, obstruction resolved, and abdominal x-ray of December 22, 2007, showed nonobstructive bowel/gas pattern.  The patient was      commenced initially on clear fluids, which he tolerated well.  He      has since been transitioned to a full liquid diet and further      advancement in diet is anticipated, as tolerated.   1. Malnutrition.  The patient is somewhat malnourished and because of      #1 & 2 above, he was in the initial part of his hospitalization,      unable to tolerate p.o. intake.  He was managed with parenteral      TNA.  As mentioned above, the patient has been commenced on  p.o.      intake with gradual advancement as of December 23, 2007.      Nutritional consultation has been  requested, and will help guide      subsequent nutritional management.   1. Alcohol abuse.  The patient is a known chronic alcoholic.  He      presented with a blood alcohol level of 121, consistent with acute      alcoholic intoxication.  He was managed with vitamin supplements      during the course of his hospitalization, and a tapering course of      Ativan for management of withdrawal phenomena.  During the course      of hospitalization, however, he showed no evidence of alcohol      withdrawal and as of December 23, 2007, i.e., the date of this      dictation, had completed Ativan taper without any deleterious      effects.   1. Right lower lobe pneumonia.  The patient presented with symptoms of      shortness of breath and cough productive of brownish phlegm,      although he denied fever or chills at the time.  Imaging studies      demonstrated right lower lobe pneumonia.  Initially, this was      deemed to be secondary to aspiration, against a background of      vomiting.  It was felt initially that a  quinolone would be      adequate.  Sputum cultures were, however, sent off, as were blood      cultures.  The blood cultures remained negative.  However, sputum      culture subsequently grew a moderate growth of Haemophilus      influenza, which was beta lactamase negative.  The patient did      spike a pyrexia of 101.8 on December 20, 2007, necessitating      discontinuing his quinolone, and commencing Zosyn, with      satisfactory clinical response.  As of the date of this dictation      on December 23, 2007, the patient was no longer pyrexial, and felt      considerably better.  Of note, chest x-ray of December 22, 2007,      showed only small bilateral effusions and bibasilar atelectasis,      but no pneumonic consolidation.  Clearly, the patient's pneumonia       is on the mend, clinically.   1. Liver disease.  The patient has a known history of viral hepatitis      C.  He is also a chronic alcoholic.  At the time of presentation,      he had abnormal LFTs with AST of 29, ALT 99.  This is likely      secondary to above-mentioned disease conditions.  There was a bump      in his AST to 570 on December 22, 2007., with ALT of 272.  This      may have been secondary to TNA therapy.  As of December 23, 2007,      AST was 276 with ALT of 216, alkaline phosphatase was normal at 41.      Following discontinuation of TNA, it is anticipated that LFTs will      improve somewhat, although continued abnormal LFTs anticipated in      view of underlying chronic liver disease.   1. Smoking history.  The patient was managed with Nicoderm CQ patch      during the course of this hospitalization.  He has been counseled  appropriately.   1. Diarrhea.  The patient did have intermittent episodes of diarrheal      stools.  This may be antibiotic related.  Stool samples have been      sent for C. difficile toxin.  However, at the time of this      dictation on December 23, 2007, results were still pending,      although diarrhea had practically resolved.   1. Deconditioning.  The patient is obviously physically deconditioned,      secondary to his acute medical problems.  He has been evaluated by      PT/OT, and conclusion is that the patient will likely require post      acute PT at a skilled nursing facility.   DISPOSITION:  This will be elucidated in detail in an addendum at the appropriate  time, by discharging MD.  However, the patient is clearly improving  clinically.  He has been troubled by back pain which is militating  against adequate mobilization, and therefore will be addressed with  analgesic medications, as well as PT/OT.  It is anticipated that over  the next few days, he will be completely weaned of parenteral TNA, and  will recover  sufficiently to be discharged, probably to skilled nursing  facility.      Isidor Holts, M.D.  Electronically Signed     CO/MEDQ  D:  12/23/2007  T:  12/23/2007  Job:  [id number]

## 2010-08-30 NOTE — Discharge Summary (Signed)
Ethan Jordan, Ethan Jordan             ACCOUNT NO.:  [id number]   MEDICAL RECORD NO.:  [id number]          PATIENT TYPE:  INP   LOCATION:  5715                         FACILITY:  [facility]   PHYSICIAN:  Adolph Pollack, M.D.DATE OF BIRTH:  August 04, 1955   DATE OF ADMISSION:  12/16/2006  DATE OF DISCHARGE:  12/24/2006                               DISCHARGE SUMMARY   DISCHARGING PHYSICIAN:  Dr. Abbey Chatters.   CONSULTANTS:  Dr. Evette Cristal with GI.   CHIEF COMPLAINT/REASON FOR ADMISSION:  Ethan Jordan is a 55 year old male  patient, recently in a local homeless shelter, whom reports being  otherwise healthy, who has been having abdominal pain, nausea and  vomiting.  Presented to the ER, and CT scan at that time demonstrated  dilated proximal loops of jejunal bowel concerning for a possible small-  bowel obstruction.  On clinical exam, his vital signs were stable.  He  was afebrile.  He had mild pain at level 4/10.  On abdominal exam, he  was slightly distended but soft.  Mild epigastric pain without signs of  diffuse peritonitis.  No true guarding.  He had a small umbilical hernia  that was not incarcerated.  His white count was normal at 6800 without a  left shift.  His hemoglobin was slightly elevated at 15.1, suggestive of  volume depletion.  His total bilirubin was 1.9.  His alkaline  phosphatase was normal at 89, but his AST and ALT was markedly elevated  with an AST of 918 and an ALT of 255.  EKG was negative.  Chest x-ray  was negative.  The patient was admitted with the following diagnoses:  1. Abdominal pain and nausea and vomiting with CT findings consistent      with small bowel obstruction.  2. Transaminitis, uncertain etiology.  3. Volume depletion.   HOSPITAL COURSE:  The patient was admitted to the general floor where he  was placed on n.p.o. status, IV fluid hydration, pain medications and  continued clinical monitoring.  By the following day, the patient's  white count remained  normal.  Hemoglobin had drifted down to 13.7 after  hydration.  Potassium was 3.2.  LFTs still remained elevated.  There was  no clear etiology to the patient's abdominal pain.  Repeat x-rays were  ordered that showed resolution of a bowel obstruction pattern.  He had  had an NG tube in place, and this was now clamped.  Hepatitis panel was  checked and, because the elevated LFTs and some epigastric pain and  abdominal ultrasound was ordered, this did demonstrate gallbladder  sludge without stones or sonographic changes consistent with acute  cholecystitis.  Over this time period, the patient continued to complain  of significant abdominal pain, and was utilizing IV Dilaudid, and his  LFTs continued to trend down again without an obstructive pattern, mild  elevation in total bilirubin with a normal alkaline phosphatase.   The patient subsequently underwent a HIDA scan which was normal as well.  The hepatitis panel did return positive for possible acute hepatitis C.  Gastroenterology was consulted.  Dr. Evette Cristal saw the patient.  A complete  workup was performed.  Tests for syphilis and HIV were negative.  At  time of dictation, an HCV RNA, PCR to confirm hepatitis C infection was  pending.   Beginning on hospital day 3, the patient's diet was slowly advanced, and  by date of discharge he was tolerating a solid diet.  He was switched  over from Dilaudid to plain OxyIR.  No Tylenol due to his elevated LFTs.  His most recent liver panel was from December 21, 2006, showed an AST of  194, ALT 128, alkaline phosphatase 64, total bilirubin 1.3 and albumin  2.5.  On clinical exam, the patient was nontender.  His abdomen was  distended and slightly tympanitic.  His liver was enlarged and there was  an appearance that the patient may be developing some mild ascites.  He  was afebrile, tolerating a solid diet, and was otherwise deemed  appropriate for discharge.   Case management/social work had been  assisting [place] with this patient.  Apparently, prior to admission, he had been a resident a local shelter,  but according to the social work note, the patient will not be able to  go back to the [facility] for a 86-month time period.  Uncertain  whether occurred with the patient and the shelter for this  recommendation from the shelter.  Today, social worker spoke with Mr.  Ethan Jordan.  Per his report, he said he will stay with his cousin,  [name].  This cousin was available to come to the hospital at 2:00  p.m. and will be transporting the patient home.   In addition, we feel that the patient probably does have an acute  hepatitis situation, uncertain etiology.  The patient denies IV drugs,  sexual contact, and prior blood transfusion.  At this time, since we  still have the RNA/PCR pending, we can not confirm this diagnosis, but  feel the patient would benefit from followup at the [facility].  The patient, in addition, also developed cough and some mild  hypoxemia.  Chest x-rays revealed a pneumonia.  Sputum cultures were  positive for beta lactamase negative Haemophilus influenzae, and the  patient has been started on Cipro this admission, and will continue this  after discharge.  In discussing followup arrangements, the patient will  follow up at [facility] initially to establish care and assist in  establishing contact for primary care needs, given his age and needing  for screening for routine issues such as colon cancer and prostate  cancer.  I called the [facility], and a form has to be  filled out and requires at least a 2 to 3-week waiting period before the  patient is determined appropriate for their clinic.  They felt that the  patient would best be served if this were done per the [facility]  personnel after he establishes care with them; therefore, they would be  communicating directly with [facility] if the patient did qualify for   the clinic.  Social work has notified me that we have an appointment for  this patient at [facility] on September 11th, at 11:45.  He has been  instructed to call on September 9th, 2008 for an eligibility  appointment.Marland Kitchen   FINAL DISCHARGE DIAGNOSES:  1. Abdominal pain with nausea and vomiting secondary to partial small-      bowel obstruction, now resolved.  2. Elevated liver function tests secondary to presumptive acute      hepatitis C infection.  HCV RNA  PCR pending at time of discharge.  3. Apparent homelessness.  4. Haemophilus influenzae pneumonia this admission, community-      acquired.  5. History of ongoing tobacco abuse.  6. Self-reported history of alcohol use.   FINAL DISCHARGE MEDICATIONS:  1. Cipro 500 mg b.i.d. for 11 more days.  2. OxyIR 5 mg every 4 hours as needed for pain, number 30 dispensed.  3. No Tylenol products.   RETURN TO WORK:  Not applicable.   DIET:  No restrictions.   WOUND CARE:  None.   ACTIVITY:  No restrictions.   FOLLOWUP:  The patient, again, is a follow up at [facility] on  September 11th at 11:45 a.m.      Allison L. Rennis Harding, N.P.      Adolph Pollack, M.D.  Electronically Signed    ALE/MEDQ  D:  12/24/2006  T:  12/25/2006  Job:  [id number]   cc:   Melvern Banker  Graylin Shiver, M.D.

## 2010-08-30 NOTE — H&P (Signed)
Ethan Jordan, Ethan Jordan             ACCOUNT NO.:  [id number]   MEDICAL RECORD NO.:  [id number]          PATIENT TYPE:  INP   LOCATION:  1432                         FACILITY:  [facility]   PHYSICIAN:  Sabino Donovan, MD        DATE OF BIRTH:  01/07/1956   DATE OF ADMISSION:  12/17/2007  DATE OF DISCHARGE:                              HISTORY & PHYSICAL   Patient is unassigned.   CHIEF COMPLAINT:  Abdominal pain, shortness of breath.   HISTORY OF PRESENT ILLNESS:  Patient is a 55 year old African American  homeless male with history of hepatitis C and alcohol abuse who  presented with complaint of abdominal pain.  He reports pain started  with sharp abdominal pain without any radiation after he ate some  bologna.  Pain got worse, it is associated with nausea and nonbilious,  nonbloody vomiting but denies any diarrhea.  Also, reports that he went  to bed and woke up noticing swelling all over.  He reports swelling in  his arm and leg.  Further reports a mild shortness of breath and a mild  cough productive of brown phlegm,otherwise denied any fever or chills.  Denies any headache or chest pains or palpitations.  Denies any urinary  symptoms.  He denies any dyspnea on exertion, PND or orthopnea.   PAST MEDICAL HISTORY:  1. Hepatitis C diagnosed on his last admission.  2. Alcohol abuse.   FAMILY HISTORY:  None.   SOCIAL HISTORY:  He drinks alcohol.  He smokes about a pack a day of  tobacco.  Denies any IV drug use.  He is homeless.   DRUG ALLERGIES:  No known drug allergies.   MEDICATION:  None.   REVIEW OF SYSTEMS:  Are unremarkable and is noted otherwise in the HPI.   PHYSICAL EXAMINATION:  Temperature 97.8.  Pulse 102.  Respiratory rate  24.  Blood pressure 114/72, 94% on room air.  GENERAL:  He is in mild distress due to abdominal pain.  HEENT:  PERRLA, EOMI.  Poor dentition.  Appears dry.  NECK:  No lymphadenopathy, thyromegaly, JVD.  CHEST:  Clear to auscultation bilaterally.  HEART:  Regular rate and rhythm.  No murmurs, rubs, or gallops.  ABDOMEN:  He has tenderness to palpation periumbilical region.  Otherwise, normoactive bowel sounds, it is soft.  EXTREMITIES:  He has trace edema in bilateral legs and a trace pitting  edema in the right arm.  NEUROLOGIC:  Intact.   LABORATORY DATA:  Sodium 132, potassium 3.3, BUN 3, creatinine 0.4,  glucose 97, white count 8.9, H and H 13.2 and 39.4.  Platelets 421, AST  was 259, ALT 99, albumin is 2.4, lipase is 18, alcohol 121, total  bilirubin 0.8.  His total protein is 6.6 and calcium 8.3.  Urinalysis  showed specific gravity of 1.025, nitrite positive but leukocyte  esterase negative.  White blood cell count is 0-2.  CT abdomen and  pelvis was done that showed a small amount ofdependent free fluid in  posterior cul-de-sac, bowel wall edema.  No evidence of bowel  perforation.  There is cholelithiasis without  evidence of cholecystitis.  There is a concern for lower lobe pneumonia.  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   IMPRESSION:  A 55 year old [place] American male with history of  hepatitis C and alcohol abuse now admitted for bowel wall edema and  shortness of breath.  1. Abdominal wall edema.  Etiology appears to be unclear.  Acute      hepatitis could present with abdominal pain, nausea and vomiting.      He has some evidence of cholelithiasis.  His white count is      unremarkable.  His total bilirubin is normal.  Unclear why he had      some abdominal wall edema.  Question whether it could be due to      hypoalbuminuria or hypoalbuminemia.  We will obtain a      gastrointestinal consult in morning, give general intravenous      fluids and follow response.  It does not appear to be severely      inflammatory in nature, however, will empirically treat with       Levaquin and Flagyl for now.  2. History of cough, shortness of breath.  Patient had worsening cough      with the above brown sputum.  CT findings are concerning for      pneumonia.  We will continue Levaquin.  3. Alcohol abuse.  We will give multivitamin, folate and thiamine and      follow, prophylaxis proton pump inhibitor.      Sabino Donovan, MD  Electronically Signed     MJ/MEDQ  D:  12/18/2007  T:  12/18/2007  Job:  [id number]

## 2010-08-30 NOTE — Consult Note (Signed)
Ethan Jordan, Ethan Jordan             ACCOUNT NO.:  [id number]   MEDICAL RECORD NO.:  [id number]          PATIENT TYPE:  INP   LOCATION:  1432                         [facility]ACILITY[facility]   PHYSICIAN:  Shirley Friar, MDDATE OF BIRTH:  1955-08-23   DATE OF CONSULTATION:  12/18/2007  DATE OF DISCHARGE:                                 CONSULTATION   We were asked to see Mr. Ferns today in consultation for abnormal CT  findings.   REFERRING PHYSICIAN:  Dr. Brien Few of [facility]   HISTORY OF PRESENT ILLNESS:  This is a 55 year old male with a history  of similar symptoms in September of 2008.  He tells me he had sudden  pain that started in his epigastrium yesterday morning after eating a  bologna and cheese sandwich.  The pain is continuous.  It prevents  sleep.  He has been vomiting p.o.'s x24 hours.  He tells me his emesis  is green.  He has no blood or coffee grounds in his emesis.  His bowel  movements are normal.  He has had no changes.  He has seen no black or  red blood in his bowel movements.  His last bowel movement was September  1.   PAST MEDICAL HISTORY:  Significant for:  1. Hepatitis C in September of 2008.  2. Alcohol abuse.  He tells me that he drinks daily beer and has an      occasional hard alcoholic beverage.  3. He had a partial small bowel obstruction in September of 2008.  4. 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  5. He is homeless and currently has no primary care Khaya Theissen.  6. He has had no surgeries that I know of.   CURRENT MEDICATIONS:  None.   ALLERGIES:  None.   SOCIAL HISTORY:  Positive for alcohol, positive for tobacco, positive  for homelessness.  He tells me does not remember if he has used IV drugs  in the past.   REVIEW OF SYSTEMS:  He has had no recent illness or sick contacts that  he knows of.  Negative for shortness of breath, palpitations.  His  weight is stable since September of 2008.   FAMILY HISTORY:  Positive for a  brother who died with cirrhosis of the  liver.   PHYSICAL EXAM:  He is alert and oriented.  His sclerae are muddy.  His  temperature is 99.0, pulse 102, respirations 14, blood pressure is  142/86.  CARDIOVASCULAR SYSTEM; Tachy rate with no murmurs, rubs or gallops  appreciated.  LUNGS:  Were clear.  ABDOMEN:  Soft, nondistended, very tender in the epigastrium as well as  right quadrant, upper and lower.  He has no peritoneal signs.  His bowel  sounds are normal.   LABORATORIES:  He has a sodium of 131, potassium 4.0, BUN 3, creatinine  0.38.  LFTs show an AST of 259, ALT 98 consistent with alcoholic liver  disease.  Alk phos is70, total bilirubin 1.3, hemoglobin 13.8 with an  MCV value of 105, hematocrit 41.2, white count 13.2, platelets 182,000,  lipase is  18.  Alcohol level when he came in yesterday was 121.   RADIOLOGICAL EXAMS:  Include an x-ray done yesterday that showed air  fluid levels in the small bowel as well as few air-filled loops in the  small bowel.  Scattered stool and gas in the colon.  Also he had a CT  done yesterday that showed:  1. Bowel wall edema of a large portion of the distal jejunum an ileum.  2. Cholelithiasis without cholecystitis.  3. Right lower quadrant pneumonia.   ASSESSMENT:  Dr. Charlott Rakes has seen and examined the patient,  collected a history and reviewed his chart.  His impression is:  This is a 55 year old male with history of alcohol abuse.  He is  homeless.  He has developed alcoholic liver disease.  He does have small  bowel wall edema on CT.  Differential includes partial small bowel  obstruction secondary to gastroenteritis as well as some symptoms could  be caused by gallbladder disease.  It could even be pancreatitis,  but  doubt this given his laboratories.  Favor gastroenteritis causing a  partial small bowel obstruction.   PLAN:  1. Agree with bowel rest and antibiotic therapy.  2. May check an abdominal ultrasound.    Thanks very much for this consultation.      Stephani Police, [place]      Shirley Friar, MD  Electronically Signed    MLY/MEDQ  D:  12/18/2007  T:  12/18/2007  Job:  [id number]   cc:   Shirley Friar, MD  Fax: [phone]

## 2010-11-24 ENCOUNTER — Inpatient Hospital Stay (INDEPENDENT_AMBULATORY_CARE_PROVIDER_SITE_OTHER)
Admission: RE | Admit: 2010-11-24 | Discharge: 2010-11-24 | Disposition: A | Discharge status: Home / Self Care | Payer: Medicaid Other | Source: Ambulatory Visit | Attending: Family Medicine | Admitting: Family Medicine

## 2010-11-24 DIAGNOSIS — L02219 Cutaneous abscess of trunk, unspecified: Secondary | ICD-10-CM

## 2010-11-28 LAB — CULTURE, ROUTINE-ABSCESS

## 2011-01-17 ENCOUNTER — Ambulatory Visit: Payer: Medicaid Other | Attending: Sports Medicine

## 2011-01-17 DIAGNOSIS — IMO0001 Reserved for inherently not codable concepts without codable children: Secondary | ICD-10-CM | POA: Insufficient documentation

## 2011-01-17 DIAGNOSIS — M2560 Stiffness of unspecified joint, not elsewhere classified: Secondary | ICD-10-CM | POA: Insufficient documentation

## 2011-01-17 DIAGNOSIS — R262 Difficulty in walking, not elsewhere classified: Secondary | ICD-10-CM | POA: Insufficient documentation

## 2011-01-17 DIAGNOSIS — M2550 Pain in unspecified joint: Secondary | ICD-10-CM | POA: Insufficient documentation

## 2011-01-17 DIAGNOSIS — M6281 Muscle weakness (generalized): Secondary | ICD-10-CM | POA: Insufficient documentation

## 2011-01-18 LAB — DIFFERENTIAL
Basophils Absolute: 0
Basophils Absolute: 0
Basophils Absolute: 0
Basophils Relative: 0
Basophils Relative: 0
Basophils Relative: 1
Eosinophils Absolute: 0.4
Eosinophils Absolute: 0.5
Eosinophils Relative: 5
Eosinophils Relative: 5
Eosinophils Relative: 6 — ABNORMAL HIGH
Lymphocytes Relative: 13
Lymphocytes Relative: 45
Monocytes Absolute: 0.5
Monocytes Absolute: 0.8
Monocytes Absolute: 1
Monocytes Relative: 9
Neutrophils Relative %: 72

## 2011-01-18 LAB — LIPASE, BLOOD: Lipase: 18

## 2011-01-18 LAB — COMPREHENSIVE METABOLIC PANEL
ALT: 113 — ABNORMAL HIGH
ALT: 216 — ABNORMAL HIGH
ALT: 98 — ABNORMAL HIGH
AST: 124 — ABNORMAL HIGH
AST: 259 — ABNORMAL HIGH
AST: 276 — ABNORMAL HIGH
AST: 570 — ABNORMAL HIGH
AST: 78 — ABNORMAL HIGH
Albumin: 2 — ABNORMAL LOW
Albumin: 2.2 — ABNORMAL LOW
Albumin: 2.5 — ABNORMAL LOW
Alkaline Phosphatase: 41
Alkaline Phosphatase: 68
Alkaline Phosphatase: 70
BUN: 13
BUN: 3 — ABNORMAL LOW
BUN: 6
CO2: 22
CO2: 24
CO2: 25
CO2: 27
Calcium: 8.3 — ABNORMAL LOW
Chloride: 102
Chloride: 104
Chloride: 105
Creatinine, Ser: 0.35 — ABNORMAL LOW
Creatinine, Ser: 0.42
Creatinine, Ser: 0.46
GFR calc Af Amer: >60
GFR calc Af Amer: >60
GFR calc Af Amer: >60
GFR calc non Af Amer: >60
GFR calc non Af Amer: >60
GFR calc non Af Amer: >60
GFR calc non Af Amer: >60
Glucose, Bld: 107 — ABNORMAL HIGH
Glucose, Bld: 116 — ABNORMAL HIGH
Glucose, Bld: 97
Potassium: 3.8
Potassium: 3.8
Potassium: 4
Potassium: 4.4
Sodium: 131 — ABNORMAL LOW
Sodium: 134 — ABNORMAL LOW
Sodium: 137
Total Bilirubin: 0.8
Total Bilirubin: 0.9
Total Bilirubin: 1.4 — ABNORMAL HIGH
Total Protein: 6.6
Total Protein: 6.7
Total Protein: 6.9

## 2011-01-18 LAB — BASIC METABOLIC PANEL
BUN: 15
BUN: 5 — ABNORMAL LOW
CO2: 26
CO2: 27
CO2: 27
CO2: 29
Calcium: 8.6
Calcium: 8.9
Chloride: 102
Chloride: 104
Chloride: 97
Creatinine, Ser: 0.35 — ABNORMAL LOW
Creatinine, Ser: 0.45
GFR calc Af Amer: >60
GFR calc Af Amer: >60
GFR calc Af Amer: >60
GFR calc non Af Amer: >60
GFR calc non Af Amer: >60
GFR calc non Af Amer: >60
Glucose, Bld: 111 — ABNORMAL HIGH
Glucose, Bld: 98
Potassium: 2.8 — ABNORMAL LOW
Potassium: 3.3 — ABNORMAL LOW
Potassium: 3.3 — ABNORMAL LOW
Potassium: 4.2
Sodium: 131 — ABNORMAL LOW
Sodium: 134 — ABNORMAL LOW
Sodium: 134 — ABNORMAL LOW
Sodium: 135
Sodium: 137

## 2011-01-18 LAB — CBC
HCT: 32.3 — ABNORMAL LOW
HCT: 32.4 — ABNORMAL LOW
HCT: 32.9 — ABNORMAL LOW
HCT: 34.3 — ABNORMAL LOW
HCT: 38.3 — ABNORMAL LOW
HCT: 39.4
Hemoglobin: 10.8 — ABNORMAL LOW
Hemoglobin: 10.8 — ABNORMAL LOW
Hemoglobin: 10.8 — ABNORMAL LOW
Hemoglobin: 10.8 — ABNORMAL LOW
Hemoglobin: 11.6 — ABNORMAL LOW
Hemoglobin: 13
Hemoglobin: 13.1
Hemoglobin: 13.2
Hemoglobin: 13.8
MCHC: 33.5
MCHC: 33.6
MCHC: 34
MCHC: 34.1
MCV: 104.3 — ABNORMAL HIGH
MCV: 104.8 — ABNORMAL HIGH
MCV: 105.3 — ABNORMAL HIGH
MCV: 105.4 — ABNORMAL HIGH
MCV: 105.7 — ABNORMAL HIGH
Platelets: 138 — ABNORMAL LOW
Platelets: 171
Platelets: 171
Platelets: 246
RBC: 3.06 — ABNORMAL LOW
RBC: 3.09 — ABNORMAL LOW
RBC: 3.12 — ABNORMAL LOW
RBC: 3.29 — ABNORMAL LOW
RBC: 3.67 — ABNORMAL LOW
RBC: 3.92 — ABNORMAL LOW
RDW: 12.9
RDW: 12.9
RDW: 13
RDW: 13
WBC: 13.4 — ABNORMAL HIGH
WBC: 14.4 — ABNORMAL HIGH
WBC: 6.5
WBC: 9.5

## 2011-01-18 LAB — MAGNESIUM
Magnesium: 1.6
Magnesium: 1.8
Magnesium: 1.8
Magnesium: 2.2

## 2011-01-18 LAB — GLUCOSE, CAPILLARY
Glucose-Capillary: 109 — ABNORMAL HIGH
Glucose-Capillary: 111 — ABNORMAL HIGH
Glucose-Capillary: 112 — ABNORMAL HIGH
Glucose-Capillary: 115 — ABNORMAL HIGH
Glucose-Capillary: 116 — ABNORMAL HIGH
Glucose-Capillary: 118 — ABNORMAL HIGH
Glucose-Capillary: 121 — ABNORMAL HIGH

## 2011-01-18 LAB — B-NATRIURETIC PEPTIDE (CONVERTED LAB): Pro B Natriuretic peptide (BNP): <30

## 2011-01-18 LAB — DRUGS OF ABUSE SCREEN W/O ALC, ROUTINE URINE
Amphetamine Screen, Ur: NEGATIVE
Benzodiazepines.: NEGATIVE
Creatinine,U: 106.8
Marijuana Metabolite: NEGATIVE
Opiate Screen, Urine: POSITIVE — AB
Propoxyphene: NEGATIVE

## 2011-01-18 LAB — CLOSTRIDIUM DIFFICILE EIA

## 2011-01-18 LAB — CULTURE, BLOOD (ROUTINE X 2)
Culture: NO GROWTH
Culture: NO GROWTH

## 2011-01-18 LAB — OPIATE, QUANTITATIVE, URINE
Hydrocodone: NEGATIVE ng/mL
Morphine, Confirm: 2700 ng/mL
Oxycodone, ur: NEGATIVE ng/mL

## 2011-01-18 LAB — URINALYSIS, ROUTINE W REFLEX MICROSCOPIC
Glucose, UA: NEGATIVE
Leukocytes, UA: NEGATIVE
Protein, ur: 30 — AB
Specific Gravity, Urine: 1.025
Urobilinogen, UA: 1

## 2011-01-18 LAB — URINE MICROSCOPIC-ADD ON

## 2011-01-18 LAB — CHOLESTEROL, TOTAL: Cholesterol: 62

## 2011-01-18 LAB — PHOSPHORUS: Phosphorus: 3.4

## 2011-01-23 LAB — RAPID URINE DRUG SCREEN, HOSP PERFORMED
Barbiturates: NOT DETECTED
Benzodiazepines: NOT DETECTED
Cocaine: NOT DETECTED
Opiates: NOT DETECTED

## 2011-01-23 LAB — CBC
HCT: 39.8
HCT: 38.9 — ABNORMAL LOW
Hemoglobin: 13.4
Hemoglobin: 13.6
MCHC: 33.8
MCV: 102 — ABNORMAL HIGH
MCV: 100
Platelets: 196
RBC: 3.9 — ABNORMAL LOW
RDW: 12.5
RDW: 12.8

## 2011-01-23 LAB — DIFFERENTIAL
Basophils Absolute: 0.2 — ABNORMAL HIGH
Basophils Relative: 2 — ABNORMAL HIGH
Lymphocytes Relative: 38
Monocytes Absolute: 0.8
Monocytes Relative: 8
Neutro Abs: 4.5
Neutrophils Relative %: 47

## 2011-01-23 LAB — I-STAT 8, (EC8 V) (CONVERTED LAB)
Acid-Base Excess: 2
Bicarbonate: 26.4 — ABNORMAL HIGH
Potassium: 4.3
TCO2: 28
pH, Ven: 7.419 — ABNORMAL HIGH

## 2011-01-23 LAB — COMPREHENSIVE METABOLIC PANEL
Albumin: 2.5 — ABNORMAL LOW
BUN: 3 — ABNORMAL LOW
Creatinine, Ser: 0.43
Glucose, Bld: 105 — ABNORMAL HIGH
Total Bilirubin: 0.9
Total Protein: 7.7

## 2011-01-23 LAB — URINALYSIS, ROUTINE W REFLEX MICROSCOPIC
Bilirubin Urine: NEGATIVE
Glucose, UA: NEGATIVE
Specific Gravity, Urine: 1.002 — ABNORMAL LOW
pH: 5.5

## 2011-01-23 LAB — POCT I-STAT CREATININE
Creatinine, Ser: 0.6
Operator id: 294521

## 2011-01-23 LAB — URINE MICROSCOPIC-ADD ON

## 2011-01-27 LAB — DIFFERENTIAL
Basophils Absolute: 0
Basophils Absolute: 0
Basophils Relative: 0
Eosinophils Relative: 2
Lymphocytes Relative: 33
Lymphocytes Relative: 12
Lymphs Abs: 1.1
Monocytes Absolute: 1.4 — ABNORMAL HIGH
Monocytes Absolute: 1.5 — ABNORMAL HIGH
Monocytes Relative: 14 — ABNORMAL HIGH
Neutro Abs: 4.8
Neutro Abs: 7.2
Neutro Abs: 5
Neutrophils Relative %: 72

## 2011-01-27 LAB — POCT CARDIAC MARKERS
Myoglobin, poc: >500
Operator id: 294521

## 2011-01-27 LAB — COMPREHENSIVE METABOLIC PANEL
ALT: 128 — ABNORMAL HIGH
ALT: 144 — ABNORMAL HIGH
ALT: 201 — ABNORMAL HIGH
AST: 245 — ABNORMAL HIGH
AST: 661 — ABNORMAL HIGH
Albumin: 2.5 — ABNORMAL LOW
Albumin: 2.6 — ABNORMAL LOW
Albumin: 2.8 — ABNORMAL LOW
Alkaline Phosphatase: 67
Alkaline Phosphatase: 70
Alkaline Phosphatase: 74
BUN: 4 — ABNORMAL LOW
BUN: 11
CO2: 29
CO2: 29
CO2: 31
Calcium: 8.7
Calcium: 8.9
Chloride: 97
Chloride: 98
Chloride: 98
Chloride: 94 — ABNORMAL LOW
Creatinine, Ser: 0.6
Creatinine, Ser: 0.68
GFR calc Af Amer: >60
GFR calc Af Amer: >60
GFR calc non Af Amer: >60
GFR calc non Af Amer: >60
GFR calc non Af Amer: >60
Glucose, Bld: 117 — ABNORMAL HIGH
Glucose, Bld: 98
Potassium: 3.8
Potassium: 4.3
Sodium: 130 — ABNORMAL LOW
Sodium: 130 — ABNORMAL LOW
Sodium: 137
Total Bilirubin: 1.5 — ABNORMAL HIGH
Total Bilirubin: 2.1 — ABNORMAL HIGH
Total Bilirubin: 1.9 — ABNORMAL HIGH
Total Protein: 7.4
Total Protein: 8.3

## 2011-01-27 LAB — URINE CULTURE
Colony Count: NO GROWTH
Culture: NO GROWTH

## 2011-01-27 LAB — URINALYSIS, MICROSCOPIC ONLY
Glucose, UA: NEGATIVE
Specific Gravity, Urine: 1.027
pH: 5

## 2011-01-27 LAB — CBC
HCT: 37.3 — ABNORMAL LOW
HCT: 43.1
Hemoglobin: 13
Hemoglobin: 13.7
MCHC: 35.2
MCV: 102.2 — ABNORMAL HIGH
MCV: 102.1 — ABNORMAL HIGH
Platelets: 136 — ABNORMAL LOW
Platelets: 183
Platelets: 106 — ABNORMAL LOW
RBC: 3.65 — ABNORMAL LOW
RBC: 3.82 — ABNORMAL LOW
RBC: 4.22
RDW: 12.6
WBC: 10
WBC: 7.7
WBC: 9.7
WBC: 6.8

## 2011-01-27 LAB — CULTURE, RESPIRATORY W GRAM STAIN

## 2011-01-27 LAB — AMYLASE
Amylase: 43
Amylase: 65

## 2011-01-27 LAB — PROTIME-INR: Prothrombin Time: 13.1

## 2011-01-27 LAB — LIPASE, BLOOD: Lipase: 29

## 2011-01-27 LAB — EXPECTORATED SPUTUM ASSESSMENT W GRAM STAIN, RFLX TO RESP C

## 2011-01-27 LAB — HIV ANTIBODY (ROUTINE TESTING W REFLEX): HIV: NONREACTIVE

## 2011-01-27 LAB — D-DIMER, QUANTITATIVE: D-Dimer, Quant: 2.11 — ABNORMAL HIGH

## 2011-02-03 ENCOUNTER — Ambulatory Visit: Payer: Medicaid Other

## 2011-02-14 ENCOUNTER — Encounter: Payer: Medicaid Other | Admitting: Rehabilitative and Restorative Service Providers"

## 2011-02-20 ENCOUNTER — Ambulatory Visit: Payer: Medicaid Other | Admitting: Rehabilitative and Restorative Service Providers"

## 2011-02-20 ENCOUNTER — Ambulatory Visit: Payer: Medicaid Other | Attending: Sports Medicine | Admitting: Physical Therapy

## 2011-02-20 DIAGNOSIS — M6281 Muscle weakness (generalized): Secondary | ICD-10-CM | POA: Insufficient documentation

## 2011-02-20 DIAGNOSIS — M2550 Pain in unspecified joint: Secondary | ICD-10-CM | POA: Insufficient documentation

## 2011-02-20 DIAGNOSIS — M2560 Stiffness of unspecified joint, not elsewhere classified: Secondary | ICD-10-CM | POA: Insufficient documentation

## 2011-02-20 DIAGNOSIS — IMO0001 Reserved for inherently not codable concepts without codable children: Secondary | ICD-10-CM | POA: Insufficient documentation

## 2011-02-20 DIAGNOSIS — R262 Difficulty in walking, not elsewhere classified: Secondary | ICD-10-CM | POA: Insufficient documentation

## 2011-07-19 ENCOUNTER — Inpatient Hospital Stay (HOSPITAL_COMMUNITY): Payer: Medicaid Other

## 2011-07-19 ENCOUNTER — Other Ambulatory Visit: Payer: Self-pay

## 2011-07-19 ENCOUNTER — Encounter (HOSPITAL_COMMUNITY): Payer: Self-pay | Admitting: *Deleted

## 2011-07-19 ENCOUNTER — Inpatient Hospital Stay (HOSPITAL_COMMUNITY)
Admission: EM | Admit: 2011-07-19 | Discharge: 2011-08-04 | DRG: 987 | Disposition: A | Discharge status: Skilled Nursing Facility | Payer: Medicaid Other | Source: Ambulatory Visit | Attending: Internal Medicine | Admitting: Internal Medicine

## 2011-07-19 ENCOUNTER — Emergency Department (HOSPITAL_COMMUNITY): Payer: Medicaid Other

## 2011-07-19 DIAGNOSIS — E870 Hyperosmolality and hypernatremia: Secondary | ICD-10-CM | POA: Diagnosis present

## 2011-07-19 DIAGNOSIS — R000 Tachycardia, unspecified: Secondary | ICD-10-CM | POA: Diagnosis present

## 2011-07-19 DIAGNOSIS — F1010 Alcohol abuse, uncomplicated: Secondary | ICD-10-CM | POA: Diagnosis present

## 2011-07-19 DIAGNOSIS — N179 Acute kidney failure, unspecified: Secondary | ICD-10-CM | POA: Diagnosis present

## 2011-07-19 DIAGNOSIS — J449 Chronic obstructive pulmonary disease, unspecified: Secondary | ICD-10-CM | POA: Diagnosis present

## 2011-07-19 DIAGNOSIS — E1110 Type 2 diabetes mellitus with ketoacidosis without coma: Secondary | ICD-10-CM

## 2011-07-19 DIAGNOSIS — J4489 Other specified chronic obstructive pulmonary disease: Secondary | ICD-10-CM | POA: Diagnosis present

## 2011-07-19 DIAGNOSIS — J811 Chronic pulmonary edema: Secondary | ICD-10-CM | POA: Diagnosis present

## 2011-07-19 DIAGNOSIS — E1310 Other specified diabetes mellitus with ketoacidosis without coma: Principal | ICD-10-CM | POA: Diagnosis present

## 2011-07-19 DIAGNOSIS — R3129 Other microscopic hematuria: Secondary | ICD-10-CM | POA: Diagnosis present

## 2011-07-19 DIAGNOSIS — K8000 Calculus of gallbladder with acute cholecystitis without obstruction: Secondary | ICD-10-CM | POA: Diagnosis present

## 2011-07-19 DIAGNOSIS — R0902 Hypoxemia: Secondary | ICD-10-CM

## 2011-07-19 DIAGNOSIS — E876 Hypokalemia: Secondary | ICD-10-CM | POA: Diagnosis not present

## 2011-07-19 DIAGNOSIS — Y846 Urinary catheterization as the cause of abnormal reaction of the patient, or of later complication, without mention of misadventure at the time of the procedure: Secondary | ICD-10-CM | POA: Diagnosis present

## 2011-07-19 DIAGNOSIS — S3720XA Unspecified injury of bladder, initial encounter: Secondary | ICD-10-CM | POA: Diagnosis present

## 2011-07-19 DIAGNOSIS — D649 Anemia, unspecified: Secondary | ICD-10-CM | POA: Diagnosis present

## 2011-07-19 DIAGNOSIS — I129 Hypertensive chronic kidney disease with stage 1 through stage 4 chronic kidney disease, or unspecified chronic kidney disease: Secondary | ICD-10-CM | POA: Diagnosis present

## 2011-07-19 DIAGNOSIS — E1101 Type 2 diabetes mellitus with hyperosmolarity with coma: Secondary | ICD-10-CM

## 2011-07-19 DIAGNOSIS — S3730XA Unspecified injury of urethra, initial encounter: Secondary | ICD-10-CM | POA: Diagnosis present

## 2011-07-19 DIAGNOSIS — K859 Acute pancreatitis without necrosis or infection, unspecified: Secondary | ICD-10-CM | POA: Diagnosis present

## 2011-07-19 DIAGNOSIS — N189 Chronic kidney disease, unspecified: Secondary | ICD-10-CM | POA: Diagnosis present

## 2011-07-19 DIAGNOSIS — I10 Essential (primary) hypertension: Secondary | ICD-10-CM | POA: Diagnosis present

## 2011-07-19 DIAGNOSIS — J9600 Acute respiratory failure, unspecified whether with hypoxia or hypercapnia: Secondary | ICD-10-CM | POA: Diagnosis not present

## 2011-07-19 DIAGNOSIS — B1920 Unspecified viral hepatitis C without hepatic coma: Secondary | ICD-10-CM | POA: Diagnosis present

## 2011-07-19 HISTORY — DX: Chronic obstructive pulmonary disease, unspecified: J44.9

## 2011-07-19 HISTORY — DX: Disorder of kidney and ureter, unspecified: N28.9

## 2011-07-19 HISTORY — DX: Unspecified viral hepatitis C without hepatic coma: B19.20

## 2011-07-19 LAB — RAPID URINE DRUG SCREEN, HOSP PERFORMED
Amphetamines: NOT DETECTED
Barbiturates: NOT DETECTED
Benzodiazepines: NOT DETECTED
Cocaine: NOT DETECTED
Tetrahydrocannabinol: NOT DETECTED

## 2011-07-19 LAB — URINALYSIS, ROUTINE W REFLEX MICROSCOPIC
Bilirubin Urine: NEGATIVE
Bilirubin Urine: NEGATIVE
Glucose, UA: >1000 mg/dL — AB
Ketones, ur: 15 mg/dL — AB
Leukocytes, UA: NEGATIVE
Nitrite: NEGATIVE
Specific Gravity, Urine: 1.039 — ABNORMAL HIGH (ref 1.005–1.030)
pH: 5 (ref 5.0–8.0)
pH: 5.5 (ref 5.0–8.0)

## 2011-07-19 LAB — BASIC METABOLIC PANEL
CO2: 22 mEq/L (ref 19–32)
CO2: 23 mEq/L (ref 19–32)
Chloride: 115 mEq/L — ABNORMAL HIGH (ref 96–112)
Chloride: 122 mEq/L — ABNORMAL HIGH (ref 96–112)
Creatinine, Ser: 1.23 mg/dL (ref 0.50–1.35)
GFR calc Af Amer: 75 mL/min — ABNORMAL LOW (ref >90)
Glucose, Bld: 868 mg/dL (ref 70–99)
Potassium: 3.6 mEq/L (ref 3.5–5.1)
Sodium: 156 mEq/L — ABNORMAL HIGH (ref 135–145)
Sodium: 160 mEq/L — ABNORMAL HIGH (ref 135–145)

## 2011-07-19 LAB — CARDIAC PANEL(CRET KIN+CKTOT+MB+TROPI): Troponin I: <0.3 ng/mL (ref <0.30)

## 2011-07-19 LAB — POCT I-STAT 3, ART BLOOD GAS (G3+)
pCO2 arterial: 33.3 mmHg — ABNORMAL LOW (ref 35.0–45.0)
pH, Arterial: 7.383 (ref 7.350–7.450)
pO2, Arterial: 58 mmHg — ABNORMAL LOW (ref 80.0–100.0)

## 2011-07-19 LAB — TROPONIN I: Troponin I: <0.3 ng/mL (ref <0.30)

## 2011-07-19 LAB — CBC
HCT: 57.1 % — ABNORMAL HIGH (ref 39.0–52.0)
Hemoglobin: 20 g/dL — ABNORMAL HIGH (ref 13.0–17.0)
Hemoglobin: 20 g/dL — ABNORMAL HIGH (ref 13.0–17.0)
Hemoglobin: 20 g/dL — ABNORMAL HIGH (ref 13.0–17.0)
MCHC: 33.2 g/dL (ref 30.0–36.0)
MCV: 102.4 fL — ABNORMAL HIGH (ref 78.0–100.0)
MCV: 98.3 fL (ref 78.0–100.0)
Platelets: 189 10*3/uL (ref 150–400)
RBC: 5.9 MIL/uL — ABNORMAL HIGH (ref 4.22–5.81)
RBC: 5.89 MIL/uL — ABNORMAL HIGH (ref 4.22–5.81)
WBC: 18.3 10*3/uL — ABNORMAL HIGH (ref 4.0–10.5)
WBC: 19.2 10*3/uL — ABNORMAL HIGH (ref 4.0–10.5)

## 2011-07-19 LAB — DIFFERENTIAL
Basophils Relative: 0 % (ref 0–1)
Eosinophils Absolute: 0 10*3/uL (ref 0.0–0.7)
Eosinophils Relative: 0 % (ref 0–5)
Lymphocytes Relative: 17 % (ref 12–46)
Lymphocytes Relative: 21 % (ref 12–46)
Lymphs Abs: 4.1 10*3/uL — ABNORMAL HIGH (ref 0.7–4.0)
Monocytes Absolute: 1.1 10*3/uL — ABNORMAL HIGH (ref 0.1–1.0)
Monocytes Relative: 6 % (ref 3–12)
Neutro Abs: 14 10*3/uL — ABNORMAL HIGH (ref 1.7–7.7)
Neutrophils Relative %: 74 % (ref 43–77)

## 2011-07-19 LAB — POCT I-STAT, CHEM 8
BUN: 59 mg/dL — ABNORMAL HIGH (ref 6–23)
Calcium, Ion: 1.28 mmol/L (ref 1.12–1.32)
Hemoglobin: 21.4 g/dL (ref 13.0–17.0)
Sodium: 158 mEq/L — ABNORMAL HIGH (ref 135–145)
TCO2: 26 mmol/L (ref 0–100)

## 2011-07-19 LAB — COMPREHENSIVE METABOLIC PANEL
ALT: 51 U/L (ref 0–53)
AST: 52 U/L — ABNORMAL HIGH (ref 0–37)
AST: 48 U/L — ABNORMAL HIGH (ref 0–37)
Alkaline Phosphatase: 170 U/L — ABNORMAL HIGH (ref 39–117)
BUN: 51 mg/dL — ABNORMAL HIGH (ref 6–23)
CO2: 22 mEq/L (ref 19–32)
Chloride: 117 mEq/L — ABNORMAL HIGH (ref 96–112)
Creatinine, Ser: 1.42 mg/dL — ABNORMAL HIGH (ref 0.50–1.35)
GFR calc Af Amer: 51 mL/min — ABNORMAL LOW (ref >90)
GFR calc Af Amer: 63 mL/min — ABNORMAL LOW (ref >90)
GFR calc non Af Amer: 55 mL/min — ABNORMAL LOW (ref >90)
Glucose, Bld: 1070 mg/dL (ref 70–99)
Glucose, Bld: 773 mg/dL (ref 70–99)
Potassium: 4.5 mEq/L (ref 3.5–5.1)
Sodium: 153 mEq/L — ABNORMAL HIGH (ref 135–145)
Total Bilirubin: 0.5 mg/dL (ref 0.3–1.2)
Total Protein: 10.4 g/dL — ABNORMAL HIGH (ref 6.0–8.3)

## 2011-07-19 LAB — POCT I-STAT 3, VENOUS BLOOD GAS (G3P V)
Acid-base deficit: 2 mmol/L (ref 0.0–2.0)
O2 Saturation: 40 %
TCO2: 29 mmol/L (ref 0–100)
pCO2, Ven: 57.7 mmHg — ABNORMAL HIGH (ref 45.0–50.0)

## 2011-07-19 LAB — GLUCOSE, CAPILLARY
Glucose-Capillary: >600 mg/dL (ref 70–99)
Glucose-Capillary: >600 mg/dL (ref 70–99)

## 2011-07-19 LAB — CK: Total CK: 185 U/L (ref 7–232)

## 2011-07-19 LAB — PROCALCITONIN: Procalcitonin: 0.14 ng/mL

## 2011-07-19 LAB — LACTIC ACID, PLASMA: Lactic Acid, Venous: 3.9 mmol/L — ABNORMAL HIGH (ref 0.5–2.2)

## 2011-07-19 LAB — URINE MICROSCOPIC-ADD ON

## 2011-07-19 LAB — LIPASE, BLOOD: Lipase: 2187 U/L — ABNORMAL HIGH (ref 11–59)

## 2011-07-19 LAB — MRSA PCR SCREENING: MRSA by PCR: NEGATIVE

## 2011-07-19 LAB — PROTIME-INR
INR: 1.2 (ref 0.00–1.49)
Prothrombin Time: 15.5 seconds — ABNORMAL HIGH (ref 11.6–15.2)

## 2011-07-19 MED ORDER — SODIUM CHLORIDE 0.9 % IV BOLUS (SEPSIS)
1000.0000 mL | Freq: Once | INTRAVENOUS | Status: AC
  Administered 2011-07-19: 1000 mL via INTRAVENOUS

## 2011-07-19 MED ORDER — FLUTICASONE-SALMETEROL 250-50 MCG/DOSE IN AEPB
1.0000 | INHALATION_SPRAY | Freq: Two times a day (BID) | RESPIRATORY_TRACT | Status: DC
  Administered 2011-07-21 – 2011-08-04 (×28): 1 via RESPIRATORY_TRACT
  Filled 2011-07-19 (×2): qty 14

## 2011-07-19 MED ORDER — ALBUTEROL SULFATE (5 MG/ML) 0.5% IN NEBU
2.5000 mg | INHALATION_SOLUTION | RESPIRATORY_TRACT | Status: DC
  Administered 2011-07-20 – 2011-07-23 (×17): 2.5 mg via RESPIRATORY_TRACT
  Filled 2011-07-19 (×19): qty 0.5

## 2011-07-19 MED ORDER — IPRATROPIUM BROMIDE 0.02 % IN SOLN
0.5000 mg | RESPIRATORY_TRACT | Status: DC
  Administered 2011-07-20 – 2011-07-23 (×17): 0.5 mg via RESPIRATORY_TRACT
  Filled 2011-07-19 (×19): qty 2.5

## 2011-07-19 MED ORDER — ALBUTEROL SULFATE (5 MG/ML) 0.5% IN NEBU: 2.5000 mg | INHALATION_SOLUTION | RESPIRATORY_TRACT | Status: DC | PRN

## 2011-07-19 MED ORDER — PANTOPRAZOLE SODIUM 40 MG IV SOLR
40.0000 mg | INTRAVENOUS | Status: DC
  Administered 2011-07-19 – 2011-07-23 (×5): 40 mg via INTRAVENOUS
  Filled 2011-07-19 (×5): qty 40

## 2011-07-19 MED ORDER — INSULIN REGULAR HUMAN 100 UNIT/ML IJ SOLN
INTRAMUSCULAR | Status: DC
  Administered 2011-07-19: 5.4 [IU]/h via INTRAVENOUS
  Administered 2011-07-20: 17.7 [IU]/h via INTRAVENOUS
  Administered 2011-07-20: 22.8 [IU]/h via INTRAVENOUS
  Filled 2011-07-19 (×2): qty 1

## 2011-07-19 MED ORDER — DEXTROSE-NACL 5-0.45 % IV SOLN: INTRAVENOUS | Status: DC

## 2011-07-19 MED ORDER — SODIUM CHLORIDE 0.9 % IV SOLN
INTRAVENOUS | Status: DC
  Administered 2011-07-20: 7.6 [IU]/h via INTRAVENOUS
  Filled 2011-07-19 (×2): qty 1

## 2011-07-19 MED ORDER — CIPROFLOXACIN IN D5W 400 MG/200ML IV SOLN
400.0000 mg | Freq: Three times a day (TID) | INTRAVENOUS | Status: DC
  Filled 2011-07-19 (×2): qty 200

## 2011-07-19 MED ORDER — DEXTROSE 50 % IV SOLN
25.0000 mL | INTRAVENOUS | Status: DC | PRN
  Administered 2011-07-20: 16 mL via INTRAVENOUS

## 2011-07-19 MED ORDER — POTASSIUM CHLORIDE 10 MEQ/100ML IV SOLN
10.0000 meq | INTRAVENOUS | Status: AC
  Administered 2011-07-19 – 2011-07-20 (×4): 10 meq via INTRAVENOUS
  Filled 2011-07-19: qty 400

## 2011-07-19 MED ORDER — SODIUM CHLORIDE 0.45 % IV SOLN
INTRAVENOUS | Status: DC
  Administered 2011-07-20: via INTRAVENOUS
  Filled 2011-07-19 (×3): qty 1000

## 2011-07-19 MED ORDER — CIPROFLOXACIN IN D5W 400 MG/200ML IV SOLN
400.0000 mg | INTRAVENOUS | Status: DC
  Administered 2011-07-19: 400 mg via INTRAVENOUS
  Filled 2011-07-19 (×2): qty 200

## 2011-07-19 MED ORDER — SODIUM CHLORIDE 0.9 % IV SOLN
INTRAVENOUS | Status: DC
  Administered 2011-07-19: 20:00:00 via INTRAVENOUS

## 2011-07-19 MED ORDER — METRONIDAZOLE IN NACL 5-0.79 MG/ML-% IV SOLN
500.0000 mg | Freq: Three times a day (TID) | INTRAVENOUS | Status: DC
  Administered 2011-07-19 – 2011-07-20 (×2): 500 mg via INTRAVENOUS
  Filled 2011-07-19 (×4): qty 100

## 2011-07-19 MED ORDER — SODIUM CHLORIDE 0.9 % IV SOLN: INTRAVENOUS | Status: AC

## 2011-07-19 MED ORDER — POTASSIUM CHLORIDE 10 MEQ/100ML IV SOLN
10.0000 meq | INTRAVENOUS | Status: AC
  Administered 2011-07-19 (×2): 10 meq via INTRAVENOUS
  Filled 2011-07-19: qty 200

## 2011-07-19 NOTE — H&P (Signed)
Name: Ethan Jordan MRN: [id number] DOB: 04-Feb-1956    LOS: 0  PCCM ADMISSION NOTE  History of Present Illness: 56 year old man on disability due to history of substance abuse, hep C and etoh who presents with the hospital with 2 days history of N/V/D non-bloody.  No fever/chills/night sweats but diffuse abdominal pain.  Patient was brought in by his daughter due to SOB and AMS that started on the day of admission.  Lines / Drains: PIV  Cultures: Blood 4/3>>> Urine 4/3>>> Sputum 4/3>>> Stool 4/3>>>  Antibiotics: Cipro 4/3>>> Flagyl 4/3>>>  Tests / Events: Abd/Pelvic CT 4/3>>>  Patient is lethargic and questions are answered by daughter who is bedside.  Past Medical History  Diagnosis Date  . Renal disorder   . Hepatitis C   COPD CRI Alcoholic liver disease  History reviewed. No pertinent past surgical history. Prior to Admission medications   Medication Sig Start Date End Date Taking? Authorizing Provider  Calcium Carbonate-Vitamin D (CALCIUM 600 + D PO) Take 3 tablets by mouth daily.   Yes Historical Provider, MD  fluticasone (FLONASE) 50 MCG/ACT nasal spray Place 2 sprays into the nose daily.   Yes Historical Provider, MD  Fluticasone-Salmeterol (ADVAIR) 250-50 MCG/DOSE AEPB Inhale 1 puff into the lungs every 12 (twelve) hours.   Yes Historical Provider, MD  Magnesium 250 MG TABS Take 1 tablet by mouth 2 (two) times daily.   Yes Historical Provider, MD  Mometasone Furo-Formoterol Fum (DULERA) 200-5 MCG/ACT AERO Inhale 2 puffs into the lungs 2 (two) times daily.   Yes Historical Provider, MD  omeprazole (PRILOSEC) 40 MG capsule Take 40 mg by mouth daily.   Yes Historical Provider, MD  sucralfate (CARAFATE) 1 G tablet Take 1 g by mouth 4 (four) times daily.   Yes Historical Provider, MD    Allergies No Known Allergies  Family History History reviewed. No pertinent family history.  Social History  reports that he has been smoking.  He does not have any smokeless  tobacco history on file. His alcohol and drug histories not on file.  Heavy drinker until 1 year ago.  Review Of Systems  11 points review of systems is negative with an exception of listed in HPI.  Vital Signs: Filed Vitals:   07/19/11 1618  BP: 140/93  Pulse: 121  Temp: 98.8 F (37.1 C)  Resp:    No intake or output data in the 24 hours ending 07/19/11 1734  Ventilator settings:    Physical Examination: General:  Lethargic ill kempt AAM, in mild respiratory distress. Neuro:  Lethargic but follows all commands and moves all ext to commands.   HEENT:  [place]/AT, PERRL, EOM-I and dry MM. Neck:  Positive JVD, -LAN and thyromegally.   Cardiovascular:  RRR, Nl S1/S2, -M/R/G. Lungs:  Bibasilar rales. Abdomen:  Soft, NT, ND and +BS. Musculoskeletal:  -edema and -tenderness. Skin:  Dry.  Labs and Imaging:   Labs: CBC    Component Value Date/Time   WBC 17.1* 07/19/2011 1552   RBC 5.90* 07/19/2011 1552   HGB 21.4* 07/19/2011 1611   HCT 63.0* 07/19/2011 1611   PLT 186 07/19/2011 1552   MCV 102.2* 07/19/2011 1552   MCH 33.9 07/19/2011 1552   MCHC 33.2 07/19/2011 1552   RDW 12.3 07/19/2011 1552   LYMPHSABS 2.9 07/19/2011 1552   MONOABS 1.5* 07/19/2011 1552   EOSABS 0.0 07/19/2011 1552   BASOSABS 0.0 07/19/2011 1552   BMET    Component Value Date/Time   NA 158*  07/19/2011 1611   K 4.7 07/19/2011 1611   CL 119* 07/19/2011 1611   CO2 22 07/19/2011 1552   GLUCOSE >700* 07/19/2011 1611   BUN 59* 07/19/2011 1611   CREATININE 1.70* 07/19/2011 1611   CALCIUM 11.0* 07/19/2011 1552   GFRNONAA 44* 07/19/2011 1552   GFRAA 51* 07/19/2011 1552   ABG    Component Value Date/Time   PHART 7.603* 04/13/2009 0311   PCO2ART 28.5* 04/13/2009 0311   PO2ART 85.0 04/13/2009 0311   HCO3 26.9* 07/19/2011 1535   TCO2 26 07/19/2011 1611   ACIDBASEDEF 2.0 07/19/2011 1535   O2SAT 40.0 07/19/2011 1535    No results found for this basename: MG in the last 168 hours Lab Results  Component Value Date   CALCIUM 11.0* 07/19/2011   PHOS 2.5  01/07/2010    Assessment and Plan: 56 yr old AAM with history of substance abuse, etoh abuse, hep C, COPD and CRI presenting with likely DKA with a high anion gap and worsening renal failure, N/V/D.  Likely Noravirus exacerbating previously poor health conditions and resulting in such a response.  Neuro: Lethargy likely due to DKA.  I recognize that Bicarb is only 22 but patient likely lives at a higher bicarb level due to COPD by history. Plan: Head CT.  Ammonia level.  Treat DKA and potential sepsis.  Pulmonary: ? Of pulmonary edema on exam, but regardless patient will need fluid.  He is full code.  High potential for respiratory failure. Plan: Titrate O2.  Advair.  Duonebs  Albuterol  IS.    Cardiac: Tachycardia and HTN. Plan: No beta blockers for now.  Volume resuscitate.  2D echo.  Renal: electrolytes ok but renal function poor. Plan: Check CPK.  Follow electrolytes.  DKA protocol.  Fluid resuscitation.  F/U BMET.  GI:  etoh history and N/V/D. Plan: F/U LFT's.  Abdominal CT.  Cipro/flagyl.  Pan cultures.  Stool culture.  NPO.  Amylase/lipase.  ID: likely GI source. Plan: Cipro/flagyl.  Pan culture.  Stool culture.  C. Diff.   Endorcrine: DKA likely (baseline HCO3 30 from previous admission). Plan: DKA protocol.  Check blood sugars.  Spoke with daughter at length, ok with short term intubation but not long term.  She will speak with the rest of the family and inform us if that is to be changed.  Best practices / Disposition: -->ICU status under PCCM -->full code -->SCD's for DVT Px -->Protonix for GI Px -->diet NPO -->family updated at bedside  The patient is critically ill with multiple organ systems failure and requires high complexity decision making for assessment and support, frequent evaluation and titration of therapies, application of advanced monitoring technologies and extensive interpretation of multiple databases. Critical Care Time devoted  to patient care services described in this note is 55 minutes.  Koren Bound, M.D. Pulmonary and Critical Care Medicine [facility] [phone]  07/19/2011, 5:34 PM

## 2011-07-19 NOTE — ED Notes (Signed)
Patient states "has had nausea/vomiting x 3-4 days".  Complains of Right Knee pain with no known injury.

## 2011-07-19 NOTE — ED Notes (Signed)
Patient wet from urine- full bed change and gown placed. Rectal temperature 98.6

## 2011-07-19 NOTE — ED Notes (Signed)
Patient taken to ICU via stretcher on monitor with RN Larena Sox

## 2011-07-19 NOTE — ED Notes (Signed)
EMS-pt reports yesterday pt started complaining of not feeling well, has hx of renal failure, CBG >600, peaked T waves on EKG. 22g(R)hand.

## 2011-07-19 NOTE — ED Notes (Signed)
2111-01 Ready 

## 2011-07-19 NOTE — ED Provider Notes (Signed)
History     CSN: [id number]  Arrival date & time 07/19/11  1426   First MD Initiated Contact with Patient 07/19/11 1453      Chief Complaint  Patient presents with  . Hyperglycemia   Level V caveat due to altered mental status. (Consider location/radiation/quality/duration/timing/severity/associated sxs/prior treatment) The history is provided by the patient and a relative.   patient reportedly was not feeling well yesterday. Today he was less responsive. Prehospital CBG was greater than 600. A previous history of needing dialysis while in the hospital. He is not on dialysis now. He reportedly has a history of hepatitis C. He is not a known diabetic  Past Medical History  Diagnosis Date  . Renal disorder   . Hepatitis C     History reviewed. No pertinent past surgical history.  History reviewed. No pertinent family history.  History  Substance Use Topics  . Smoking status: Current Everyday Smoker  . Smokeless tobacco: Not on file  . Alcohol Use:       Review of Systems  Unable to perform ROS: Mental status change    Allergies  Review of patient's allergies indicates no known allergies.  Home Medications   Current Outpatient Rx  Name Route Sig Dispense Refill  . CALCIUM 600 + D PO Oral Take 3 tablets by mouth daily.    Marland Kitchen FLUTICASONE PROPIONATE 50 MCG/ACT NA SUSP Nasal Place 2 sprays into the nose daily.    Marland Kitchen FLUTICASONE-SALMETEROL 250-50 MCG/DOSE IN AEPB Inhalation Inhale 1 puff into the lungs every 12 (twelve) hours.    Marland Kitchen MAGNESIUM 250 MG PO TABS Oral Take 1 tablet by mouth 2 (two) times daily.    . MOMETASONE FURO-FORMOTEROL FUM 200-5 MCG/ACT IN AERO Inhalation Inhale 2 puffs into the lungs 2 (two) times daily.    Marland Kitchen OMEPRAZOLE 40 MG PO CPDR Oral Take 40 mg by mouth daily.    . SUCRALFATE 1 G PO TABS Oral Take 1 g by mouth 4 (four) times daily.      BP 140/93  Pulse 121  Temp(Src) 98.8 F (37.1 C) (Oral)  Resp 19  SpO2 95%  Physical Exam    Constitutional: He appears well-developed.  HENT:  Head: Normocephalic and atraumatic.  Eyes: Pupils are equal, round, and reactive to light.  Neck: Normal range of motion.  Cardiovascular:       Tachycardia  Pulmonary/Chest: Effort normal and breath sounds normal.       Patient has jugular venous distention  Abdominal: Soft. There is no tenderness.  Musculoskeletal: Normal range of motion.  Neurological:       Patient is minimally verbal right now. He will arouse somewhat to voice. Most of the history was obtained from a family member  Skin: Skin is warm.    ED Course  Procedures (including critical care time)  Labs Reviewed  GLUCOSE, CAPILLARY - Abnormal; Notable for the following:    Glucose-Capillary >600 (*)    All other components within normal limits  CBC - Abnormal; Notable for the following:    WBC 17.1 (*)    RBC 5.90 (*)    Hemoglobin 20.0 (*)    HCT 60.3 (*)    MCV 102.2 (*)    All other components within normal limits  DIFFERENTIAL - Abnormal; Notable for the following:    Neutro Abs 12.7 (*)    Monocytes Absolute 1.5 (*)    All other components within normal limits  COMPREHENSIVE METABOLIC PANEL - Abnormal; Notable for the  following:    Sodium 153 (*)    Glucose, Bld 1070 (*)    BUN 54 (*)    Creatinine, Ser 1.71 (*)    Calcium 11.0 (*)    Total Protein 10.4 (*)    AST 52 (*)    Alkaline Phosphatase 170 (*)    GFR calc non Af Amer 44 (*)    GFR calc Af Amer 51 (*)    All other components within normal limits  URINALYSIS, ROUTINE W REFLEX MICROSCOPIC - Abnormal; Notable for the following:    Specific Gravity, Urine 1.036 (*)    Glucose, UA >1000 (*) CORRECTED ON 04/03 AT 1646: PREVIOUSLY REPORTED AS NEGATIVE CALLED TO RN A. WARD 1628 07/19/11 KERAN M. CORRECTED ON 04/03 AT 1626: PREVIOUSLY REPORTED AS >1000   Hgb urine dipstick SMALL (*) CORRECTED ON 04/03 AT 1646: PREVIOUSLY REPORTED AS TRACE CALLED TO RN A. WARD 1628 07/19/11 KERAN M. CORRECTED ON 04/03  AT 1626: PREVIOUSLY REPORTED AS SMALL   Ketones, ur 15 (*) CORRECTED ON 04/03 AT 1646: PREVIOUSLY REPORTED AS NEGATIVE CALLED TO RN A. WARD 1628 07/19/11 KERAN M. CORRECTED ON 04/03 AT 1626: PREVIOUSLY REPORTED AS 15   Protein, ur 30 (*) CORRECTED ON 04/03 AT 1646: PREVIOUSLY REPORTED AS NEGATIVE CALLED TO RN A. WARD 1628 07/19/11 Sallyanne Kuster M. CORRECTED ON 04/03 AT 1626: PREVIOUSLY REPORTED AS 30   All other components within normal limits  POCT I-STAT 3, BLOOD GAS (G3P V) - Abnormal; Notable for the following:    pCO2, Ven 57.7 (*)    pO2, Ven 27.0 (*)    Bicarbonate 26.9 (*)    All other components within normal limits  POCT I-STAT, CHEM 8 - Abnormal; Notable for the following:    Sodium 158 (*)    Chloride 119 (*)    BUN 59 (*)    Creatinine, Ser 1.70 (*)    Glucose, Bld >700 (*)    Hemoglobin 21.4 (*)    HCT 63.0 (*)    All other components within normal limits  URINE MICROSCOPIC-ADD ON - Abnormal; Notable for the following:    Casts HYALINE CASTS (*)    All other components within normal limits  LACTIC ACID, PLASMA - Abnormal; Notable for the following:    Lactic Acid, Venous 3.9 (*)    All other components within normal limits  URINE RAPID DRUG SCREEN (HOSP PERFORMED)  TROPONIN I  ETHANOL  CK  CULTURE, BLOOD (ROUTINE X 2)  CULTURE, BLOOD (ROUTINE X 2)   Dg Chest Port 1 View  07/19/2011  *RADIOLOGY REPORT*  Clinical Data: Chest pain.  Altered mental status.  PORTABLE CHEST - 1 VIEW  Comparison: Portable chest 01/10/2010.  Findings: The heart is mildly enlarged, as before.  The lung volumes have decreased.  Mild pulmonary vascular congestion is now present.  Mild bibasilar atelectasis is noted.  The right IJ line has been removed.  IMPRESSION:  1.  Borderline cardiomegaly and mild pulmonary vascular congestion. 2.  Low lung volumes. 3.  Mild bibasilar atelectasis without other focal airspace disease.  Original Report Authenticated By: Jamesetta Orleans. MATTERN, M.D.     1. Hyperosmolar  (nonketotic) coma      Date: 07/19/2011  Rate: 123  Rhythm: sinus tachycardia  QRS Axis: normal  Intervals: normal  ST/T Wave abnormalities: nonspecific T wave changes  Conduction Disutrbances:Q waves inferiorly  Narrative Interpretation:   Old EKG Reviewed: changes noted  CRITICAL CARE Performed by: Billee Cashing   Total critical care  time: 30  Critical care time was exclusive of separately billable procedures and treating other patients.  Critical care was necessary to treat or prevent imminent or life-threatening deterioration.  Critical care was time spent personally by me on the following activities: development of treatment plan with patient and/or surrogate as well as nursing, discussions with consultants, evaluation of patient's response to treatment, examination of patient, obtaining history from patient or surrogate, ordering and performing treatments and interventions, ordering and review of laboratory studies, ordering and review of radiographic studies, pulse oximetry and re-evaluation of patient's condition.  MDM  Altered mental status. His been there for couple days. Is a hyper osmolar nonketotic coma. Is a decreased mental status. He'll be admitted to the ICU. His sugar is 1000. His kidney function shows a creatinine of 1.7. Is a likely increase. He does have some passive congestion JVD, believe he still needs more fluids at this time. He was seen in the ER by critical care        Juliet Rude. Rubin Payor, MD 07/19/11 [phone]

## 2011-07-19 NOTE — ED Notes (Signed)
Received Critical lab report Glucose 1070. Dr. Rubin Payor made aware.

## 2011-07-19 NOTE — ED Notes (Signed)
Gave  EKGS to Dr Karma Ganja

## 2011-07-19 NOTE — Progress Notes (Signed)
ANTIBIOTIC CONSULT NOTE - INITIAL  Pharmacy Consult for renal adjustment of antibiotics Indication: empiric antibiotics for probable GI source of infection  No Known Allergies  Patient Measurements:   Weight:  83.1 kg  Vital Signs: Temp: 98.8 F (37.1 C) (04/03 1618) Temp src: Oral (04/03 1618) BP: 133/89 mmHg (04/03 1915) Pulse Rate: 117  (04/03 1915) Intake/Output from previous day:   Intake/Output from this shift:    Labs:  Basename 07/19/11 1902 07/19/11 1806 07/19/11 1611 07/19/11 1552  WBC -- 18.3* -- 17.1*  HGB -- 20.0* 21.4* 20.0*  PLT -- 189 -- 186  LABCREA -- -- -- --  CREATININE 1.38* -- 1.70* 1.71*   CrCl is unknown because there is no height on file for the current visit. No results found for this basename: VANCOTROUGH:2,VANCOPEAK:2,VANCORANDOM:2,GENTTROUGH:2,GENTPEAK:2,GENTRANDOM:2,TOBRATROUGH:2,TOBRAPEAK:2,TOBRARND:2,AMIKACINPEAK:2,AMIKACINTROU:2,AMIKACIN:2, in the last 72 hours   Microbiology: No results found for this or any previous visit (from the past 720 hour(s)).  Medical History: Past Medical History  Diagnosis Date  . Renal disorder   . Hepatitis C     Medications:  Prescriptions prior to admission  Medication Sig Dispense Refill  . Calcium Carbonate-Vitamin D (CALCIUM 600 + D PO) Take 3 tablets by mouth daily.      . fluticasone (FLONASE) 50 MCG/ACT nasal spray Place 2 sprays into the nose daily.      . Fluticasone-Salmeterol (ADVAIR) 250-50 MCG/DOSE AEPB Inhale 1 puff into the lungs every 12 (twelve) hours.      . Magnesium 250 MG TABS Take 1 tablet by mouth 2 (two) times daily.      . Mometasone Furo-Formoterol Fum (DULERA) 200-5 MCG/ACT AERO Inhale 2 puffs into the lungs 2 (two) times daily.      Marland Kitchen omeprazole (PRILOSEC) 40 MG capsule Take 40 mg by mouth daily.      . sucralfate (CARAFATE) 1 G tablet Take 1 g by mouth 4 (four) times daily.       Assessment: 56 yo admitted with likely DKA, worsening renal function and N/V/D to start  empiric antibiotics.  Goal of Therapy:  Appropriate antibiotic dosing  Plan:  Cont flagyl 500 mg IV q8 hours. Change Cipro to 400 mg IV q24 hours. F/u renal function and clinical course.  Talbert Cage Poteet 07/19/2011,8:34 PM

## 2011-07-19 NOTE — ED Notes (Signed)
Critical Chem 8 results handed to Dr.Pickering

## 2011-07-19 NOTE — ED Notes (Signed)
Patient taken to CT via stretcher.  Wife given personal belonging and medications.

## 2011-07-19 NOTE — Progress Notes (Addendum)
Name: Ethan Jordan MRN: [id number] DOB: 1955-12-23    LOS: 0  PCCM Progress Note  Subjective: Patient is still somnolent and is unable to answer any questions. Most of the talking is done by the daughter who he is staying with since last 3 years. Patient is not a known diabetic and had his last physical exam done in December 2012 when every thing was satisfactory. Patient's PCP works at [employer] [facility] in [place]. Patient was his usual self and completely normal at home until the night of 4/2 when he started having N/V and loose stools. No sick contacts noted.   Lines / Drains:  PIV   Cultures:  Blood 4/3>>>  Urine 4/3>>>  Sputum 4/3>>>  Stool 4/3>>>   Antibiotics:  Cipro 4/3>>>  Flagyl 4/3>>>   Tests / Events:  Abd/Pelvic CT 4/3>>> CT head 4/3 >>> Normal study   The patient is somnolent and is unable to provide history, which was obtained from daughter and available medical records.    Past Medical History  Diagnosis Date  . Renal disorder   . Hepatitis C   . COPD (chronic obstructive pulmonary disease)     On inhaled steroids   History reviewed. No pertinent past surgical history. Prior to Admission medications   Medication Sig Start Date End Date Taking? Authorizing Provider  Calcium Carbonate-Vitamin D (CALCIUM 600 + D PO) Take 3 tablets by mouth daily.   Yes Historical Provider, MD  fluticasone (FLONASE) 50 MCG/ACT nasal spray Place 2 sprays into the nose daily.   Yes Historical Provider, MD  Fluticasone-Salmeterol (ADVAIR) 250-50 MCG/DOSE AEPB Inhale 1 puff into the lungs every 12 (twelve) hours.   Yes Historical Provider, MD  Magnesium 250 MG TABS Take 1 tablet by mouth 2 (two) times daily.   Yes Historical Provider, MD  Mometasone Furo-Formoterol Fum (DULERA) 200-5 MCG/ACT AERO Inhale 2 puffs into the lungs 2 (two) times daily.   Yes Historical Provider, MD  omeprazole (PRILOSEC) 40 MG capsule Take 40 mg by mouth daily.   Yes Historical Provider, MD    sucralfate (CARAFATE) 1 G tablet Take 1 g by mouth 4 (four) times daily.   Yes Historical Provider, MD   Allergies No Known Allergies  Family History History reviewed. No pertinent family history.  Social History  reports that he has been smoking.  He does not have any smokeless tobacco history on file. His alcohol and drug histories not on file.  Review Of Systems  Patient unable to provide  Vital Signs: Temp:  [98.2 F (36.8 C)-98.8 F (37.1 C)] 98.8 F (37.1 C) (04/03 1618) Pulse Rate:  [117-124] 117  (04/03 1915) Resp:  [19-38] 38  (04/03 1800) BP: (126-140)/(88-94) 133/89 mmHg (04/03 1915) SpO2:  [94 %-96 %] 95 % (04/03 1915)    Physical Examination: General: somnolent, no acute distress Neuro:  AO X 0, somnolent,  nonfocal HEENT:  PERRL, pink conjunctivae, dry moist membranes Neck:  Supple, no JVD   Cardiovascular: tachycardic regular rhythm, no M/R/G Lungs:  Normal air entry on anterior auscultation, no W/R/R Abdomen:  Soft, nontender, nondistended, bowel sounds present Musculoskeletal:  Moves all extremities, no pedal edema Skin:  No rash   Labs and Imaging:  Reviewed.  Please refer to the Assessment and Plan section for relevant results.  ASSESSMENT AND PLAN  NEUROLOGIC A: lethargic, non conversant and somnolent, head CT normal P: Treat DKA, await ammonia level  PULMONARY  Lab 07/19/11 2043 07/19/11 1535  PHART 7.383 --  PCO2ART 33.3* --  PO2ART 58.0* --  HCO3 19.8* 26.9*  O2SAT 90.0 40.0   A:  No pulmonary edema on exam although he is breathing>30/min, CXR does not show edema, fluids at 100cc/hr and saturating 95% on 3lts P: d/c IVF if O2 requirement increases or if he desaturates   CARDIOVASCULAR  Lab 07/19/11 2056 07/19/11 2053 07/19/11 1643 07/19/11 1551  TROPONINI -- <0.30 -- <0.30  LATICACIDVEN 3.6* -- 3.9* --  PROBNP -- 65.9 -- --   A:  tachycardic P:  Continue IVF and 2d echo in AM to asses EF  RENAL  Lab 07/19/11 2145 07/19/11  2053 07/19/11 1902 07/19/11 1611 07/19/11 1552  NA 160* 160* 156* 158* 153*  K 3.6 3.5 -- -- --  CL 122* 117* 115* 119* 107  CO2 23 22 22  -- 22  BUN 47* 51* 53* 59* 54*  CREATININE 1.23 1.42* 1.38* 1.70* 1.71*  CALCIUM 10.7* 10.6* 10.3 -- 11.0*  MG -- 3.6* -- -- --  PHOS -- 2.6 -- -- --   A:  Anion gap is still 21, crt and gfr improved, K 3.6, hypernatremic and hyperchloremic P: Follow electrolytes q2h and replete K, change IVF to 1/2 NS   GASTROINTESTINAL  Lab 07/19/11 2053 07/19/11 1552  AST 48* 52*  ALT 47 51  ALKPHOS 161* 170*  BILITOT 0.5 0.6  PROT 10.0* 10.4*  ALBUMIN 3.7 4.4   A:  No nausea or vomiting since in ICU, cipro/flagyl started. P: Awaiting stool studies, lipase/amylase, NPO for now  HEMATOLOGIC  Lab 07/19/11 2053 07/19/11 1806 07/19/11 1611 07/19/11 1552  HGB 20.0* 20.0* 21.4* 20.0*  HCT 57.1* 60.3* 63.0* 60.3*  PLT 209 189 -- 186  INR 1.20 -- -- --  APTT 25 -- -- --   A:  Polycythemia likely related to smoking/hemoconcentration from dehydration P:  Follow CBC  INFECTIOUS  Lab 07/19/11 2053 07/19/11 2046 07/19/11 1806 07/19/11 1552  WBC 19.2* -- 18.3* 17.1*  PROCALCITON -- 0.14 -- --   A:  Likely viral (norovirus) given acuteness of illness, lecocytosis may be infectious vs a stress response from DKA P: Await culture results, cont DKA protocol  ENDOCRINE  Lab 07/19/11 2009 07/19/11 1909 07/19/11 1434  GLUCAP >600* >600* >600*   A: In DKA, likely new onset diabetes, anion gap still 21 P: -->  CBG checks  -->  Cont insulin drip till anion gap closes  BEST PRACTICE / DISPOSITION Best practices / Disposition:  -->ICU status under PCCM  -->full code  -->SCD's for DVT Px  -->Protonix for GI Px  -->diet NPO  -->family updated at bedside   Lars Mage MD [facility] Internal Medicine Resident Pager [phone]   07/19/2011, 10:53 PM

## 2011-07-20 ENCOUNTER — Encounter (HOSPITAL_COMMUNITY): Payer: Self-pay | Admitting: Radiology

## 2011-07-20 ENCOUNTER — Inpatient Hospital Stay (HOSPITAL_COMMUNITY): Payer: Medicaid Other

## 2011-07-20 DIAGNOSIS — K859 Acute pancreatitis without necrosis or infection, unspecified: Secondary | ICD-10-CM

## 2011-07-20 DIAGNOSIS — E870 Hyperosmolality and hypernatremia: Secondary | ICD-10-CM

## 2011-07-20 DIAGNOSIS — E1101 Type 2 diabetes mellitus with hyperosmolarity with coma: Secondary | ICD-10-CM

## 2011-07-20 DIAGNOSIS — N179 Acute kidney failure, unspecified: Secondary | ICD-10-CM

## 2011-07-20 DIAGNOSIS — R42 Dizziness and giddiness: Secondary | ICD-10-CM

## 2011-07-20 LAB — CBC
MCH: 34 pg (ref 26.0–34.0)
MCV: 96.9 fL (ref 78.0–100.0)
Platelets: 200 10*3/uL (ref 150–400)
RDW: 12 % (ref 11.5–15.5)
WBC: 21.4 10*3/uL — ABNORMAL HIGH (ref 4.0–10.5)

## 2011-07-20 LAB — LIPID PANEL
Cholesterol: 107 mg/dL (ref 0–200)
Total CHOL/HDL Ratio: 3.1 RATIO
Triglycerides: 166 mg/dL — ABNORMAL HIGH (ref <150)
VLDL: 33 mg/dL (ref 0–40)

## 2011-07-20 LAB — BASIC METABOLIC PANEL
BUN: 41 mg/dL — ABNORMAL HIGH (ref 6–23)
BUN: 39 mg/dL — ABNORMAL HIGH (ref 6–23)
BUN: 37 mg/dL — ABNORMAL HIGH (ref 6–23)
BUN: 33 mg/dL — ABNORMAL HIGH (ref 6–23)
CO2: 20 mEq/L (ref 19–32)
CO2: 20 mEq/L (ref 19–32)
Calcium: 10.2 mg/dL (ref 8.4–10.5)
Calcium: 9.8 mg/dL (ref 8.4–10.5)
Chloride: >130 mEq/L (ref 96–112)
Chloride: >130 mEq/L (ref 96–112)
Creatinine, Ser: 1.09 mg/dL (ref 0.50–1.35)
Creatinine, Ser: 1.1 mg/dL (ref 0.50–1.35)
Creatinine, Ser: 1.06 mg/dL (ref 0.50–1.35)
Creatinine, Ser: 0.96 mg/dL (ref 0.50–1.35)
GFR calc Af Amer: 83 mL/min — ABNORMAL LOW (ref >90)
GFR calc Af Amer: 86 mL/min — ABNORMAL LOW (ref >90)
GFR calc Af Amer: >90 mL/min (ref >90)
GFR calc Af Amer: >90 mL/min (ref >90)
GFR calc non Af Amer: 64 mL/min — ABNORMAL LOW (ref >90)
GFR calc non Af Amer: 71 mL/min — ABNORMAL LOW (ref >90)
GFR calc non Af Amer: 74 mL/min — ABNORMAL LOW (ref >90)
GFR calc non Af Amer: 78 mL/min — ABNORMAL LOW (ref >90)
Glucose, Bld: 160 mg/dL — ABNORMAL HIGH (ref 70–99)
Potassium: 4 mEq/L (ref 3.5–5.1)
Potassium: 3.7 mEq/L (ref 3.5–5.1)
Potassium: 4.5 mEq/L (ref 3.5–5.1)
Sodium: 161 mEq/L (ref 135–145)

## 2011-07-20 LAB — GLUCOSE, CAPILLARY
Glucose-Capillary: 223 mg/dL — ABNORMAL HIGH (ref 70–99)
Glucose-Capillary: 322 mg/dL — ABNORMAL HIGH (ref 70–99)
Glucose-Capillary: 440 mg/dL — ABNORMAL HIGH (ref 70–99)
Glucose-Capillary: 506 mg/dL — ABNORMAL HIGH (ref 70–99)
Glucose-Capillary: 221 mg/dL — ABNORMAL HIGH (ref 70–99)
Glucose-Capillary: 143 mg/dL — ABNORMAL HIGH (ref 70–99)
Glucose-Capillary: 209 mg/dL — ABNORMAL HIGH (ref 70–99)
Glucose-Capillary: 344 mg/dL — ABNORMAL HIGH (ref 70–99)
Glucose-Capillary: 162 mg/dL — ABNORMAL HIGH (ref 70–99)
Glucose-Capillary: 166 mg/dL — ABNORMAL HIGH (ref 70–99)

## 2011-07-20 LAB — CARDIAC PANEL(CRET KIN+CKTOT+MB+TROPI)
CK, MB: 2.1 ng/mL (ref 0.3–4.0)
CK, MB: 2.3 ng/mL (ref 0.3–4.0)
Troponin I: <0.3 ng/mL (ref <0.30)

## 2011-07-20 LAB — URINE CULTURE
Colony Count: NO GROWTH
Culture  Setup Time: 201304032150

## 2011-07-20 MED ORDER — KCL IN DEXTROSE-NACL 40-5-0.45 MEQ/L-%-% IV SOLN
INTRAVENOUS | Status: DC
  Administered 2011-07-20: 100 mL/h via INTRAVENOUS
  Filled 2011-07-20 (×2): qty 1000

## 2011-07-20 MED ORDER — LIVING WELL WITH DIABETES BOOK
Freq: Once | Status: AC
  Administered 2011-07-21: 10:00:00
  Filled 2011-07-20: qty 1

## 2011-07-20 MED ORDER — DEXTROSE-NACL 5-0.45 % IV SOLN
INTRAVENOUS | Status: DC
  Administered 2011-07-20: 100 mL/h via INTRAVENOUS

## 2011-07-20 MED ORDER — CHLORHEXIDINE GLUCONATE 0.12 % MT SOLN
OROMUCOSAL | Status: AC
  Filled 2011-07-20: qty 15

## 2011-07-20 MED ORDER — CHLORHEXIDINE GLUCONATE 0.12 % MT SOLN
15.0000 mL | Freq: Two times a day (BID) | OROMUCOSAL | Status: DC
  Administered 2011-07-20 – 2011-08-04 (×21): 15 mL via OROMUCOSAL
  Filled 2011-07-20 (×31): qty 15

## 2011-07-20 MED ORDER — SODIUM CHLORIDE 0.45 % IV SOLN
INTRAVENOUS | Status: DC
  Administered 2011-07-20: 150 mL/h via INTRAVENOUS
  Administered 2011-07-21 (×2): 1000 mL via INTRAVENOUS
  Administered 2011-07-22: 08:00:00 via INTRAVENOUS

## 2011-07-20 MED ORDER — DEXTROSE-NACL 5-0.45 % IV SOLN
INTRAVENOUS | Status: DC
  Administered 2011-07-20: 09:00:00 via INTRAVENOUS

## 2011-07-20 MED ORDER — MORPHINE SULFATE 2 MG/ML IJ SOLN
2.0000 mg | INTRAMUSCULAR | Status: DC | PRN
  Administered 2011-07-20 (×2): 2 mg via INTRAVENOUS
  Administered 2011-07-21: 4 mg via INTRAVENOUS
  Administered 2011-07-21 – 2011-07-23 (×8): 2 mg via INTRAVENOUS
  Administered 2011-07-24: 4 mg via INTRAVENOUS
  Administered 2011-07-24: 2 mg via INTRAVENOUS
  Filled 2011-07-20 (×11): qty 1
  Filled 2011-07-20 (×2): qty 2
  Filled 2011-07-20: qty 1

## 2011-07-20 MED ORDER — POTASSIUM PHOSPHATE DIBASIC 3 MMOLE/ML IV SOLN
20.0000 mmol | Freq: Once | INTRAVENOUS | Status: AC
  Administered 2011-07-20: 20 mmol via INTRAVENOUS
  Filled 2011-07-20: qty 6.67

## 2011-07-20 MED ORDER — SODIUM CHLORIDE 0.9 % IV BOLUS (SEPSIS)
1000.0000 mL | Freq: Once | INTRAVENOUS | Status: AC
  Administered 2011-07-20: 1000 mL via INTRAVENOUS

## 2011-07-20 MED ORDER — DEXTROSE 5 % IV SOLN
INTRAVENOUS | Status: DC
  Administered 2011-07-20: 05:00:00 via INTRAVENOUS

## 2011-07-20 MED ORDER — BIOTENE DRY MOUTH MT LIQD
15.0000 mL | Freq: Two times a day (BID) | OROMUCOSAL | Status: DC
  Administered 2011-07-20 – 2011-08-04 (×24): 15 mL via OROMUCOSAL

## 2011-07-20 MED ORDER — POTASSIUM CHLORIDE 10 MEQ/100ML IV SOLN
10.0000 meq | INTRAVENOUS | Status: AC
  Administered 2011-07-20 (×3): 10 meq via INTRAVENOUS
  Filled 2011-07-20 (×4): qty 100

## 2011-07-20 NOTE — Consult Note (Addendum)
Reason for Consult: Acute Pancreatitis Referring Physician: CCM  Ethan Jordan is an 56 y.o. male.  HPI: Pt is a 56 year old man with history of substance abuse, hep C and etoh who is admitted to the hospital with 2 days history of N/V/D non-bloody and AMS- was found to have DKA ( no prior DM Hx) and Acute Pancreatis. No prior Hx of pancreatitis. Pt feels a little better this morning and reports having last vomiting episode last night. More responsive now than admission.    Past Medical History  Diagnosis Date  . Renal disorder   . Hepatitis C   . COPD (chronic obstructive pulmonary disease)     On inhaled steroids  . Diabetes mellitus     History reviewed. No pertinent past surgical history.  History reviewed. No pertinent family history.  Social History:  reports that he has been smoking.  He does not have any smokeless tobacco history on file. His alcohol and drug histories not on file.  Allergies: No Known Allergies  Medications: I have reviewed the patient's current medications.  Results for orders placed during the hospital encounter of 07/19/11 (from the past 48 hour(s))  GLUCOSE, CAPILLARY     Status: Abnormal   Collection Time   07/19/11  2:34 PM      Component Value Range Comment   Glucose-Capillary >600 (*) 70 - 99 (mg/dL)    Comment 1 Documented in Chart     POCT I-STAT 3, BLOOD GAS (G3P V)     Status: Abnormal   Collection Time   07/19/11  3:35 PM      Component Value Range Comment   pH, Ven 7.276  7.250 - 7.300     pCO2, Ven 57.7 (*) 45.0 - 50.0 (mmHg)    pO2, Ven 27.0 (*) 30.0 - 45.0 (mmHg)    Bicarbonate 26.9 (*) 20.0 - 24.0 (mEq/L)    TCO2 29  0 - 100 (mmol/L)    O2 Saturation 40.0      Acid-base deficit 2.0  0.0 - 2.0 (mmol/L)    Sample type VENOUS      Comment NOTIFIED PHYSICIAN     URINALYSIS, ROUTINE W REFLEX MICROSCOPIC     Status: Abnormal   Collection Time   07/19/11  3:49 PM      Component Value Range Comment   Color, Urine YELLOW  YELLOW     APPearance CLEAR  CLEAR     Specific Gravity, Urine 1.036 (*) 1.005 - 1.030     pH 5.0  5.0 - 8.0  CORRECTED ON 04/03 AT 1646: PREVIOUSLY REPORTED AS 6.0 CALLED TO RN A. WARD 1628 07/19/11 KERAN M. CORRECTED ON 04/03 AT 1626: PREVIOUSLY REPORTED AS 5.0   Glucose, UA >1000 (*) NEGATIVE (mg/dL) CORRECTED ON 16/10 AT 1646: PREVIOUSLY REPORTED AS NEGATIVE CALLED TO RN A. WARD 1628 07/19/11 KERAN M. CORRECTED ON 04/03 AT 1626: PREVIOUSLY REPORTED AS >1000   Hgb urine dipstick SMALL (*) NEGATIVE  CORRECTED ON 04/03 AT 1646: PREVIOUSLY REPORTED AS TRACE CALLED TO RN A. WARD 1628 07/19/11 KERAN M. CORRECTED ON 04/03 AT 1626: PREVIOUSLY REPORTED AS SMALL   Bilirubin Urine NEGATIVE  NEGATIVE     Ketones, ur 15 (*) NEGATIVE (mg/dL) CORRECTED ON 96/04 AT 1646: PREVIOUSLY REPORTED AS NEGATIVE CALLED TO RN A. WARD 1628 07/19/11 KERAN M. CORRECTED ON 04/03 AT 1626: PREVIOUSLY REPORTED AS 15   Protein, ur 30 (*) NEGATIVE (mg/dL) CORRECTED ON 54/09 AT 1646: PREVIOUSLY REPORTED AS NEGATIVE CALLED TO  RN A. WARD 1628 07/19/11 Sallyanne Kuster M. CORRECTED ON 04/03 AT 1626: PREVIOUSLY REPORTED AS 30   Urobilinogen, UA 1.0  0.0 - 1.0 (mg/dL) CORRECTED ON 11/91 AT 1646: PREVIOUSLY REPORTED AS 0.2 CALLED TO RN A. WARD 1628 07/19/11 Sallyanne Kuster M. CORRECTED ON 04/03 AT 1626: PREVIOUSLY REPORTED AS 1.0   Nitrite NEGATIVE  NEGATIVE     Leukocytes, UA NEGATIVE  NEGATIVE    URINE MICROSCOPIC-ADD ON     Status: Abnormal   Collection Time   07/19/11  3:49 PM      Component Value Range Comment   RBC / HPF 0-2  <3 (RBC/hpf)    Bacteria, UA RARE  RARE     Casts HYALINE CASTS (*) NEGATIVE    URINE RAPID DRUG SCREEN (HOSP PERFORMED)     Status: Normal   Collection Time   07/19/11  3:50 PM      Component Value Range Comment   Opiates NONE DETECTED  NONE DETECTED     Cocaine NONE DETECTED  NONE DETECTED     Benzodiazepines NONE DETECTED  NONE DETECTED     Amphetamines NONE DETECTED  NONE DETECTED     Tetrahydrocannabinol NONE DETECTED  NONE DETECTED      Barbiturates NONE DETECTED  NONE DETECTED    TROPONIN I     Status: Normal   Collection Time   07/19/11  3:51 PM      Component Value Range Comment   Troponin I <0.30  <0.30 (ng/mL)   CBC     Status: Abnormal   Collection Time   07/19/11  3:52 PM      Component Value Range Comment   WBC 17.1 (*) 4.0 - 10.5 (K/uL)    RBC 5.90 (*) 4.22 - 5.81 (MIL/uL)    Hemoglobin 20.0 (*) 13.0 - 17.0 (g/dL)    HCT 47.8 (*) 29.5 - 52.0 (%)    MCV 102.2 (*) 78.0 - 100.0 (fL)    MCH 33.9  26.0 - 34.0 (pg)    MCHC 33.2  30.0 - 36.0 (g/dL)    RDW 62.1  30.8 - 65.7 (%)    Platelets 186  150 - 400 (K/uL)   DIFFERENTIAL     Status: Abnormal   Collection Time   07/19/11  3:52 PM      Component Value Range Comment   Neutrophils Relative 74  43 - 77 (%)    Lymphocytes Relative 17  12 - 46 (%)    Monocytes Relative 9  3 - 12 (%)    Eosinophils Relative 0  0 - 5 (%)    Basophils Relative 0  0 - 1 (%)    Neutro Abs 12.7 (*) 1.7 - 7.7 (K/uL)    Lymphs Abs 2.9  0.7 - 4.0 (K/uL)    Monocytes Absolute 1.5 (*) 0.1 - 1.0 (K/uL)    Eosinophils Absolute 0.0  0.0 - 0.7 (K/uL)    Basophils Absolute 0.0  0.0 - 0.1 (K/uL)    WBC Morphology TOXIC GRANULATION   ATYPICAL LYMPHOCYTES  COMPREHENSIVE METABOLIC PANEL     Status: Abnormal   Collection Time   07/19/11  3:52 PM      Component Value Range Comment   Sodium 153 (*) 135 - 145 (mEq/L)    Potassium 4.5  3.5 - 5.1 (mEq/L)    Chloride 107  96 - 112 (mEq/L)    CO2 22  19 - 32 (mEq/L)    Glucose, Bld 1070 (*) 70 - 99 (mg/dL)  BUN 54 (*) 6 - 23 (mg/dL)    Creatinine, Ser 1.61 (*) 0.50 - 1.35 (mg/dL)    Calcium 09.6 (*) 8.4 - 10.5 (mg/dL)    Total Protein 04.5 (*) 6.0 - 8.3 (g/dL)    Albumin 4.4  3.5 - 5.2 (g/dL)    AST 52 (*) 0 - 37 (U/L)    ALT 51  0 - 53 (U/L)    Alkaline Phosphatase 170 (*) 39 - 117 (U/L)    Total Bilirubin 0.6  0.3 - 1.2 (mg/dL)    GFR calc non Af Amer 44 (*) >90 (mL/min)    GFR calc Af Amer 51 (*) >90 (mL/min)   ETHANOL     Status: Normal    Collection Time   07/19/11  3:52 PM      Component Value Range Comment   Alcohol, Ethyl (B) <11  0 - 11 (mg/dL)   POCT I-STAT, CHEM 8     Status: Abnormal   Collection Time   07/19/11  4:11 PM      Component Value Range Comment   Sodium 158 (*) 135 - 145 (mEq/L)    Potassium 4.7  3.5 - 5.1 (mEq/L)    Chloride 119 (*) 96 - 112 (mEq/L)    BUN 59 (*) 6 - 23 (mg/dL)    Creatinine, Ser 4.09 (*) 0.50 - 1.35 (mg/dL)    Glucose, Bld >811 (*) 70 - 99 (mg/dL)    Calcium, Ion 9.14  1.12 - 1.32 (mmol/L)    TCO2 26  0 - 100 (mmol/L)    Hemoglobin 21.4 (*) 13.0 - 17.0 (g/dL)    HCT 78.2 (*) 95.6 - 52.0 (%)    Comment NOTIFIED PHYSICIAN     LACTIC ACID, PLASMA     Status: Abnormal   Collection Time   07/19/11  4:43 PM      Component Value Range Comment   Lactic Acid, Venous 3.9 (*) 0.5 - 2.2 (mmol/L)   CK     Status: Normal   Collection Time   07/19/11  4:48 PM      Component Value Range Comment   Total CK 185  7 - 232 (U/L)   CBC     Status: Abnormal   Collection Time   07/19/11  6:06 PM      Component Value Range Comment   WBC 18.3 (*) 4.0 - 10.5 (K/uL)    RBC 5.89 (*) 4.22 - 5.81 (MIL/uL)    Hemoglobin 20.0 (*) 13.0 - 17.0 (g/dL)    HCT 21.3 (*) 08.6 - 52.0 (%)    MCV 102.4 (*) 78.0 - 100.0 (fL)    MCH 34.0  26.0 - 34.0 (pg)    MCHC 33.2  30.0 - 36.0 (g/dL)    RDW 57.8  46.9 - 62.9 (%)    Platelets 189  150 - 400 (K/uL)   BASIC METABOLIC PANEL     Status: Abnormal   Collection Time   07/19/11  7:02 PM      Component Value Range Comment   Sodium 156 (*) 135 - 145 (mEq/L)    Potassium 4.3  3.5 - 5.1 (mEq/L)    Chloride 115 (*) 96 - 112 (mEq/L)    CO2 22  19 - 32 (mEq/L)    Glucose, Bld 868 (*) 70 - 99 (mg/dL)    BUN 53 (*) 6 - 23 (mg/dL)    Creatinine, Ser 5.28 (*) 0.50 - 1.35 (mg/dL)    Calcium 41.3  8.4 -  10.5 (mg/dL)    GFR calc non Af Amer 57 (*) >90 (mL/min)    GFR calc Af Amer 66 (*) >90 (mL/min)   GLUCOSE, CAPILLARY     Status: Abnormal   Collection Time   07/19/11  7:09 PM       Component Value Range Comment   Glucose-Capillary >600 (*) 70 - 99 (mg/dL)   GLUCOSE, CAPILLARY     Status: Abnormal   Collection Time   07/19/11  8:09 PM      Component Value Range Comment   Glucose-Capillary >600 (*) 70 - 99 (mg/dL)   MRSA PCR SCREENING     Status: Normal   Collection Time   07/19/11  8:28 PM      Component Value Range Comment   MRSA by PCR NEGATIVE  NEGATIVE    URINALYSIS, ROUTINE W REFLEX MICROSCOPIC     Status: Abnormal   Collection Time   07/19/11  8:29 PM      Component Value Range Comment   Color, Urine YELLOW  YELLOW     APPearance CLEAR  CLEAR     Specific Gravity, Urine 1.039 (*) 1.005 - 1.030     pH 5.5  5.0 - 8.0     Glucose, UA >1000 (*) NEGATIVE (mg/dL)    Hgb urine dipstick SMALL (*) NEGATIVE     Bilirubin Urine NEGATIVE  NEGATIVE     Ketones, ur 15 (*) NEGATIVE (mg/dL)    Protein, ur NEGATIVE  NEGATIVE (mg/dL)    Urobilinogen, UA 1.0  0.0 - 1.0 (mg/dL)    Nitrite NEGATIVE  NEGATIVE     Leukocytes, UA NEGATIVE  NEGATIVE    URINE MICROSCOPIC-ADD ON     Status: Normal   Collection Time   07/19/11  8:29 PM      Component Value Range Comment   Squamous Epithelial / LPF RARE  RARE     WBC, UA 0-2  <3 (WBC/hpf)    Urine-Other AMORPHOUS URATES/PHOSPHATES     POCT I-STAT 3, BLOOD GAS (G3+)     Status: Abnormal   Collection Time   07/19/11  8:43 PM      Component Value Range Comment   pH, Arterial 7.383  7.350 - 7.450     pCO2 arterial 33.3 (*) 35.0 - 45.0 (mmHg)    pO2, Arterial 58.0 (*) 80.0 - 100.0 (mmHg)    Bicarbonate 19.8 (*) 20.0 - 24.0 (mEq/L)    TCO2 21  0 - 100 (mmol/L)    O2 Saturation 90.0      Acid-base deficit 4.0 (*) 0.0 - 2.0 (mmol/L)    Patient temperature 98.8 F      Collection site RADIAL, ALLEN'S TEST ACCEPTABLE      Drawn by RT      Sample type ARTERIAL     PROCALCITONIN     Status: Normal   Collection Time   07/19/11  8:46 PM      Component Value Range Comment   Procalcitonin 0.14     CBC     Status: Abnormal   Collection Time     07/19/11  8:53 PM      Component Value Range Comment   WBC 19.2 (*) 4.0 - 10.5 (K/uL)    RBC 5.81  4.22 - 5.81 (MIL/uL)    Hemoglobin 20.0 (*) 13.0 - 17.0 (g/dL)    HCT 16.1 (*) 09.6 - 52.0 (%)    MCV 98.3  78.0 - 100.0 (fL)    MCH 34.4 (*)  26.0 - 34.0 (pg)    MCHC 35.0  30.0 - 36.0 (g/dL)    RDW 44.3  15.4 - 00.8 (%)    Platelets 209  150 - 400 (K/uL)   DIFFERENTIAL     Status: Abnormal   Collection Time   07/19/11  8:53 PM      Component Value Range Comment   Neutrophils Relative 73  43 - 77 (%)    Neutro Abs 14.0 (*) 1.7 - 7.7 (K/uL)    Lymphocytes Relative 21  12 - 46 (%)    Lymphs Abs 4.1 (*) 0.7 - 4.0 (K/uL)    Monocytes Relative 6  3 - 12 (%)    Monocytes Absolute 1.1 (*) 0.1 - 1.0 (K/uL)    Eosinophils Relative 0  0 - 5 (%)    Eosinophils Absolute 0.0  0.0 - 0.7 (K/uL)    Basophils Relative 0  0 - 1 (%)    Basophils Absolute 0.0  0.0 - 0.1 (K/uL)   COMPREHENSIVE METABOLIC PANEL     Status: Abnormal   Collection Time   07/19/11  8:53 PM      Component Value Range Comment   Sodium 160 (*) 135 - 145 (mEq/L)    Potassium 3.5  3.5 - 5.1 (mEq/L) DELTA CHECK NOTED   Chloride 117 (*) 96 - 112 (mEq/L)    CO2 22  19 - 32 (mEq/L)    Glucose, Bld 773 (*) 70 - 99 (mg/dL)    BUN 51 (*) 6 - 23 (mg/dL)    Creatinine, Ser 6.76 (*) 0.50 - 1.35 (mg/dL)    Calcium 19.5 (*) 8.4 - 10.5 (mg/dL)    Total Protein 09.3 (*) 6.0 - 8.3 (g/dL)    Albumin 3.7  3.5 - 5.2 (g/dL)    AST 48 (*) 0 - 37 (U/L)    ALT 47  0 - 53 (U/L)    Alkaline Phosphatase 161 (*) 39 - 117 (U/L)    Total Bilirubin 0.5  0.3 - 1.2 (mg/dL)    GFR calc non Af Amer 55 (*) >90 (mL/min)    GFR calc Af Amer 63 (*) >90 (mL/min)   PROTIME-INR     Status: Abnormal   Collection Time   07/19/11  8:53 PM      Component Value Range Comment   Prothrombin Time 15.5 (*) 11.6 - 15.2 (seconds)    INR 1.20  0.00 - 1.49    APTT     Status: Normal   Collection Time   07/19/11  8:53 PM      Component Value Range Comment   aPTT 25  24 -  37 (seconds)   CARDIAC PANEL(CRET KIN+CKTOT+MB+TROPI)     Status: Normal   Collection Time   07/19/11  8:53 PM      Component Value Range Comment   Total CK 199  7 - 232 (U/L)    CK, MB 2.6  0.3 - 4.0 (ng/mL)    Troponin I <0.30  <0.30 (ng/mL)    Relative Index 1.3  0.0 - 2.5    PRO B NATRIURETIC PEPTIDE     Status: Normal   Collection Time   07/19/11  8:53 PM      Component Value Range Comment   Pro B Natriuretic peptide (BNP) 65.9  0 - 125 (pg/mL)   MAGNESIUM     Status: Abnormal   Collection Time   07/19/11  8:53 PM      Component Value Range Comment  Magnesium 3.6 (*) 1.5 - 2.5 (mg/dL)   PHOSPHORUS     Status: Normal   Collection Time   07/19/11  8:53 PM      Component Value Range Comment   Phosphorus 2.6  2.3 - 4.6 (mg/dL)   AMYLASE     Status: Abnormal   Collection Time   07/19/11  8:53 PM      Component Value Range Comment   Amylase 1038 (*) 0 - 105 (U/L)   LIPASE, BLOOD     Status: Abnormal   Collection Time   07/19/11  8:53 PM      Component Value Range Comment   Lipase 2187 (*) 11 - 59 (U/L)   LACTIC ACID, PLASMA     Status: Abnormal   Collection Time   07/19/11  8:56 PM      Component Value Range Comment   Lactic Acid, Venous 3.6 (*) 0.5 - 2.2 (mmol/L)   GLUCOSE, CAPILLARY     Status: Abnormal   Collection Time   07/19/11  9:14 PM      Component Value Range Comment   Glucose-Capillary >600 (*) 70 - 99 (mg/dL)   BASIC METABOLIC PANEL     Status: Abnormal   Collection Time   07/19/11  9:45 PM      Component Value Range Comment   Sodium 160 (*) 135 - 145 (mEq/L)    Potassium 3.6  3.5 - 5.1 (mEq/L)    Chloride 122 (*) 96 - 112 (mEq/L)    CO2 23  19 - 32 (mEq/L)    Glucose, Bld 682 (*) 70 - 99 (mg/dL)    BUN 47 (*) 6 - 23 (mg/dL)    Creatinine, Ser 8.41  0.50 - 1.35 (mg/dL)    Calcium 32.4 (*) 8.4 - 10.5 (mg/dL)    GFR calc non Af Amer 65 (*) >90 (mL/min)    GFR calc Af Amer 75 (*) >90 (mL/min)   GLUCOSE, CAPILLARY     Status: Abnormal   Collection Time   07/19/11 10:09  PM      Component Value Range Comment   Glucose-Capillary >600 (*) 70 - 99 (mg/dL)   GLUCOSE, CAPILLARY     Status: Abnormal   Collection Time   07/19/11 11:11 PM      Component Value Range Comment   Glucose-Capillary 490 (*) 70 - 99 (mg/dL)   GLUCOSE, CAPILLARY     Status: Abnormal   Collection Time   07/19/11 11:55 PM      Component Value Range Comment   Glucose-Capillary 506 (*) 70 - 99 (mg/dL)    Comment 1 Notify RN     BASIC METABOLIC PANEL     Status: Abnormal   Collection Time   07/20/11 12:21 AM      Component Value Range Comment   Sodium 161 (*) 135 - 145 (mEq/L)    Potassium 4.0  3.5 - 5.1 (mEq/L) HEMOLYSIS AT THIS LEVEL MAY AFFECT RESULT   Chloride 124 (*) 96 - 112 (mEq/L)    CO2 20  19 - 32 (mEq/L)    Glucose, Bld 527 (*) 70 - 99 (mg/dL)    BUN 46 (*) 6 - 23 (mg/dL)    Creatinine, Ser 4.01  0.50 - 1.35 (mg/dL)    Calcium 02.7  8.4 - 10.5 (mg/dL)    GFR calc non Af Amer 64 (*) >90 (mL/min)    GFR calc Af Amer 74 (*) >90 (mL/min)   GLUCOSE, CAPILLARY  Status: Abnormal   Collection Time   07/20/11  1:10 AM      Component Value Range Comment   Glucose-Capillary 440 (*) 70 - 99 (mg/dL)   GLUCOSE, CAPILLARY     Status: Abnormal   Collection Time   07/20/11  2:08 AM      Component Value Range Comment   Glucose-Capillary 333 (*) 70 - 99 (mg/dL)   CARDIAC PANEL(CRET KIN+CKTOT+MB+TROPI)     Status: Normal   Collection Time   07/20/11  2:47 AM      Component Value Range Comment   Total CK 165  7 - 232 (U/L)    CK, MB 2.1  0.3 - 4.0 (ng/mL)    Troponin I <0.30  <0.30 (ng/mL)    Relative Index 1.3  0.0 - 2.5    AMMONIA     Status: Normal   Collection Time   07/20/11  2:48 AM      Component Value Range Comment   Ammonia 15  11 - 60 (umol/L)   GLUCOSE, CAPILLARY     Status: Abnormal   Collection Time   07/20/11  3:06 AM      Component Value Range Comment   Glucose-Capillary 322 (*) 70 - 99 (mg/dL)   GLUCOSE, CAPILLARY     Status: Abnormal   Collection Time   07/20/11  4:09 AM       Component Value Range Comment   Glucose-Capillary 223 (*) 70 - 99 (mg/dL)   GLUCOSE, CAPILLARY     Status: Abnormal   Collection Time   07/20/11  5:07 AM      Component Value Range Comment   Glucose-Capillary 201 (*) 70 - 99 (mg/dL)   GLUCOSE, CAPILLARY     Status: Abnormal   Collection Time   07/20/11  6:27 AM      Component Value Range Comment   Glucose-Capillary 173 (*) 70 - 99 (mg/dL)   BASIC METABOLIC PANEL     Status: Abnormal   Collection Time   07/20/11  6:50 AM      Component Value Range Comment   Sodium 168 (*) 135 - 145 (mEq/L)    Potassium 4.7  3.5 - 5.1 (mEq/L) HEMOLYSIS AT THIS LEVEL MAY AFFECT RESULT   Chloride >130 (*) 96 - 112 (mEq/L)    CO2 20  19 - 32 (mEq/L)    Glucose, Bld 195 (*) 70 - 99 (mg/dL)    BUN 41 (*) 6 - 23 (mg/dL)    Creatinine, Ser 1.61  0.50 - 1.35 (mg/dL)    Calcium 09.6 (*) 8.4 - 10.5 (mg/dL)    GFR calc non Af Amer 75 (*) >90 (mL/min)    GFR calc Af Amer 87 (*) >90 (mL/min)   MAGNESIUM     Status: Abnormal   Collection Time   07/20/11  6:50 AM      Component Value Range Comment   Magnesium 3.5 (*) 1.5 - 2.5 (mg/dL)   PHOSPHORUS     Status: Abnormal   Collection Time   07/20/11  6:50 AM      Component Value Range Comment   Phosphorus 1.1 (*) 2.3 - 4.6 (mg/dL)   GLUCOSE, CAPILLARY     Status: Abnormal   Collection Time   07/20/11  7:18 AM      Component Value Range Comment   Glucose-Capillary 182 (*) 70 - 99 (mg/dL)   CBC     Status: Abnormal   Collection Time   07/20/11  7:38 AM  Component Value Range Comment   WBC 21.4 (*) 4.0 - 10.5 (K/uL)    RBC 5.88 (*) 4.22 - 5.81 (MIL/uL)    Hemoglobin 20.0 (*) 13.0 - 17.0 (g/dL)    HCT 16.1 (*) 09.6 - 52.0 (%)    MCV 96.9  78.0 - 100.0 (fL)    MCH 34.0  26.0 - 34.0 (pg)    MCHC 35.1  30.0 - 36.0 (g/dL)    RDW 04.5  40.9 - 81.1 (%)    Platelets 200  150 - 400 (K/uL)   GLUCOSE, CAPILLARY     Status: Abnormal   Collection Time   07/20/11  7:59 AM      Component Value Range Comment    Glucose-Capillary 211 (*) 70 - 99 (mg/dL)   CARDIAC PANEL(CRET KIN+CKTOT+MB+TROPI)     Status: Abnormal   Collection Time   07/20/11  9:00 AM      Component Value Range Comment   Total CK 353 (*) 7 - 232 (U/L) HEMOLYSIS AT THIS LEVEL MAY AFFECT RESULT   CK, MB 2.3  0.3 - 4.0 (ng/mL)    Troponin I <0.30  <0.30 (ng/mL)    Relative Index 0.7  0.0 - 2.5    BASIC METABOLIC PANEL     Status: Abnormal   Collection Time   07/20/11  9:00 AM      Component Value Range Comment   Sodium 168 (*) 135 - 145 (mEq/L)    Potassium 3.7  3.5 - 5.1 (mEq/L)    Chloride >130 (*) 96 - 112 (mEq/L)    CO2 24  19 - 32 (mEq/L)    Glucose, Bld 141 (*) 70 - 99 (mg/dL)    BUN 39 (*) 6 - 23 (mg/dL)    Creatinine, Ser 9.14  0.50 - 1.35 (mg/dL)    Calcium 78.2 (*) 8.4 - 10.5 (mg/dL)    GFR calc non Af Amer 71 (*) >90 (mL/min)    GFR calc Af Amer 83 (*) >90 (mL/min)   GLUCOSE, CAPILLARY     Status: Abnormal   Collection Time   07/20/11  9:01 AM      Component Value Range Comment   Glucose-Capillary 221 (*) 70 - 99 (mg/dL)   GLUCOSE, CAPILLARY     Status: Abnormal   Collection Time   07/20/11 10:14 AM      Component Value Range Comment   Glucose-Capillary 198 (*) 70 - 99 (mg/dL)   GLUCOSE, CAPILLARY     Status: Abnormal   Collection Time   07/20/11 11:10 AM      Component Value Range Comment   Glucose-Capillary 58 (*) 70 - 99 (mg/dL)    Comment 1 Repeat Test     GLUCOSE, CAPILLARY     Status: Abnormal   Collection Time   07/20/11 11:11 AM      Component Value Range Comment   Glucose-Capillary 60 (*) 70 - 99 (mg/dL)   GLUCOSE, CAPILLARY     Status: Abnormal   Collection Time   07/20/11 11:49 AM      Component Value Range Comment   Glucose-Capillary 123 (*) 70 - 99 (mg/dL)     Ct Abdomen Pelvis Wo Contrast  07/20/2011  *RADIOLOGY REPORT*  Clinical Data: Diabetic ketoacidosis.  Abdominal pain.  Possible sepsis.  CT ABDOMEN AND PELVIS WITHOUT CONTRAST  Technique:  Multidetector CT imaging of the abdomen and pelvis was  performed following the standard protocol without intravenous contrast.  Comparison: 04/10/2009.  Findings: The lung bases demonstrate  a small effusions and bibasilar atelectasis.  The heart is borderline enlarged.  The unenhanced appearance of the liver is unremarkable.  No focal lesions or biliary dilatation.  Small gallstones are noted in the gallbladder.  No distention or inflammation.  The common bile duct is normal in caliber.  The pancreas is mildly enlarged and inflamed with peripancreatic inflammatory changes also involving the second portion of the duodenum and also of the mesocolon.  Findings consistent with acute pancreatitis.  The spleen is normal in size.  No focal lesions.  The adrenal glands and kidneys are normal.  No renal or obstructing ureteral calculi.  The stomach is unremarkable.  The third and fourth portion of the duodenum are normal.  The small bowel is unremarkable.  Dense material is noted the colon.  This is likely ingested material such as Pepto-Bismol.  Mild fibrofatty infiltrative changes involving the colon may suggest a remote inflammatory bowel disease.  No active inflammation is demonstrated.  The appendix is normal. Dense material is noted in the appendix.  The aorta is normal in caliber.  Mild atherosclerotic changes.  No mesenteric or retroperitoneal masses or adenopathy.  There are small scattered lymph nodes typically in the periportal and celiac axis region.  There is a Foley catheter in the bladder.  The prostate gland and seminal vesicles appear normal.  There is artifact through the lower pelvis due to a right hip prosthesis.  No pelvic mass, adenopathy or free pelvic fluid collections.  No inguinal mass or hernia.  The bony structures are intact.  IMPRESSION:  1.  CT findings consistent with acute pancreatitis. 2.  Cholelithiasis. 3.  No other significant abdominal/pelvic findings. 4.  Small bilateral pleural effusions and bibasilar atelectasis.  Original Report  Authenticated By: P. Loralie Champagne, M.D.   Ct Head Wo Contrast  07/19/2011  *RADIOLOGY REPORT*  Clinical Data: Altered mental status.  CT HEAD WITHOUT CONTRAST  Technique:  Contiguous axial images were obtained from the base of the skull through the vertex without contrast.  Comparison: Head CT 03/29/2007.  Findings: The ventricles are normal.  No extra-axial fluid collections are seen.  The brainstem and cerebellum are unremarkable.  No acute intracranial findings such as infarction or hemorrhage.  No mass lesions.  The bony calvarium is intact.  The visualized paranasal sinuses and mastoid air cells are clear.  IMPRESSION: No acute intracranial findings or mass lesions.  No change since prior examination.  Original Report Authenticated By: P. Loralie Champagne, M.D.   Dg Chest Port 1 View  07/20/2011  *RADIOLOGY REPORT*  Clinical Data: Assess endotracheal tube.  PORTABLE CHEST - 1 VIEW  Comparison: 07/19/2011.  Findings: Endotracheal tube tip is not visualized on present exam.  Cardiomegaly.  Pulmonary vascular congestion.  No segmental consolidation.  No gross pneumothorax.  IMPRESSION: Endotracheal tube tip is not visualized on the present exam.  Cardiomegaly.  Pulmonary vascular congestion.  Original Report Authenticated By: Fuller Canada, M.D.   Dg Chest Port 1 View  07/19/2011  *RADIOLOGY REPORT*  Clinical Data: Chest pain.  Altered mental status.  PORTABLE CHEST - 1 VIEW  Comparison: Portable chest 01/10/2010.  Findings: The heart is mildly enlarged, as before.  The lung volumes have decreased.  Mild pulmonary vascular congestion is now present.  Mild bibasilar atelectasis is noted.  The right IJ line has been removed.  IMPRESSION:  1.  Borderline cardiomegaly and mild pulmonary vascular congestion. 2.  Low lung volumes. 3.  Mild bibasilar atelectasis without other focal  airspace disease.  Original Report Authenticated By: Jamesetta Orleans. MATTERN, M.D.   Dg Chest Port 1v Same Day  07/19/2011   *RADIOLOGY REPORT*  Clinical Data: Aspiration.  PORTABLE CHEST - 1 VIEW SAME DAY  Comparison: Earlier film, same date.  Findings: The heart is borderline enlarged but stable.  There is mild vascular congestion without overt pulmonary edema.  Persistent bibasilar atelectasis.  No definite infiltrates or effusions.  IMPRESSION: Stable chest x-ray.  Original Report Authenticated By: P. Loralie Champagne, M.D.    ROS:  As stated above in the HPI otherwise negative.  Blood pressure 131/91, pulse 121, temperature 98.1 F (36.7 C), temperature source Oral, resp. rate 31, height 5\' 11"  (1.803 m), weight 83.1 kg (183 lb 3.2 oz), SpO2 94.00%.  PE: Gen: somnolent - but easily arousable. In mild distress. HEENT:  [place]/AT, EOMI Neck: Supple, no LAD Lungs: Mild crackles at bases B/L CV: tachycardic without M/G/R ABM: Soft, NTND, +BS Ext: No C/C/E Neuro: Somnolent.  Assessment/Plan:  Acute Pancreatitis: N/V and abd pain along with elevated Lipase 2187 and Amylase 1038 and CT abd showing mild acute Pancreatitis without necrosis/pseudocyst. Also has Cholelithiasis but no CBD dilation. Has no elevations in liver enzymes.  - Continue aggressive IV hydration. - Agree with DC abx. - No need of any ERCP/MRCP at present.  DKA: Unclear if related to acute pancreatitis. - Continue aggressive hydration and Mx per CCM   PATEL,RAVI 07/20/2011, 12:25 PM    Clinically the patient may have a gallstone pancreatitis, however, with his diabetes triglycerides should be checked.  The patient is an alcoholic, but his recent ETOH level was normal.  I am concerned about his hydration status in that his HCT is at 57.  He did receive some bolus of NS, but I feel that he needs to have aggressive IV fluid infusions.  This will ultimately help to protect the pancreas from necrosis as hemoconcentration can lead to microvascular hypoperfusion.  No evidence of any ductal dilation on the CT scan.  No need for ERCP at this time.  He  will require a surgical consultation for cholecystectomy.  I discussed with Dr. Vassie Loll about the hemoconcentration issue and he will increase the patient's fluids.

## 2011-07-20 NOTE — Progress Notes (Signed)
Nutrition Consult (Brief Note)  Received consult for new onset diabetes diet education.  Patient is very lethargic and not alert enough for education today per RN.  Will follow-up for education at a later date and time when patient is more appropriate for education.  Ethan Jordan [phone]

## 2011-07-20 NOTE — Progress Notes (Signed)
CRITICAL VALUE ALERT  Critical value received: NA 168,CL >130  Date of notification: 4/413  Time of notification:  0915  Critical value read back:yes  Nurse who received alert: Lbass  MD notified (1st page):  Dr Milbert Coulter  Time of first page: 0915  MD notified (2nd page):  Time of second page:  Responding MD: Dr Milbert Coulter  Time MD responded: [phone]

## 2011-07-20 NOTE — Progress Notes (Signed)
  Echocardiogram 2D Echocardiogram has been performed.  Suhan Paci L 07/20/2011, 10:18 AM

## 2011-07-20 NOTE — Progress Notes (Signed)
UR Completed.  Vangie Bicker [phone] 07/20/2011

## 2011-07-20 NOTE — Progress Notes (Signed)
eLink Physician-Brief Progress Note Patient Name: Ethan Jordan DOB: [birth year] MRN: [id number]  Date of Service  07/20/2011   HPI/Events of Note   Appears hemoconcentrated by labs - d/w GI  eICU Interventions  Increase 1/2 NS to 150/h, drop d51/2 NS to 50/h, CBGs better   Intervention Category Intermediate Interventions: Electrolyte abnormality - evaluation and management  Maryse Brierley V. 07/20/2011, 6:36 PM

## 2011-07-20 NOTE — Progress Notes (Signed)
Name: Ethan Jordan MRN: [id number] DOB: [birth year] LOS: 1  PCCM PROGRESS NOTE  Background: 56 yo M on disability d/t history of substance abuse, hep C and etoh who p/w  2d of non bloody N/V/D.  No fever/chills/night sweats but diffuse abdominal pain. Patient was brought in by his daughter d/t SOB and AMS that began on DOA.    Subjective: Pulled foley out this morning with bleeding.  Mental status has fluctuated throughout night.   Lines, Tubes, etc: PIV 4/3  Microbiology: Blood Cx 4/3 >>> Urine Cx 4/3 >>> Sputum Cx 4/3>>>  Stool Cx 4/3>>>  Antibiotics:  Cipro 4/3>>>  Flagyl 4/3>>>  Studies/Events: Abd/Pelvic CT 4/3>>> acute pancreatitis, cholelithiasis  Consults: None   Vital Signs: Filed Vitals:   07/20/11 0400 07/20/11 0500 07/20/11 0600 07/20/11 0700  BP: 117/89 113/90 132/95 124/89  Pulse: 125 124 128 124  Temp: 97.5 F (36.4 C)     TempSrc: Oral     Resp: 30 30 35 32  Height:      Weight:      SpO2: 96% 93% 95% 96%    Physical Examination: General: No acute distress  Neuro:  Oriented x 3, lethargic Cardiovascular:  Tachycardic, no m/r/g Lungs:  CTA b/l, poor inspiratory effort, no rales/rhonchi/wheezing, tachypneic Abdomen:  Diffusely ttp, soft, normoactive BS Musculoskeletal:  No edema/tenderness  Labs: Basic Metabolic Panel:  Lab 07/20/11 6213 07/19/11 2145 07/19/11 2053  NA 161* 160* --  K 4.0 3.6 --  CL 124* 122* --  CO2 20 23 --  GLUCOSE 527* 682* --  BUN 46* 47* --  CREATININE 1.25 1.23 --  CALCIUM 10.2 10.7* --  MG -- -- 3.6*  PHOS -- -- 2.6   Liver Function Tests:  Lab 07/19/11 2053 07/19/11 1552  AST 48* 52*  ALT 47 51  ALKPHOS 161* 170*  BILITOT 0.5 0.6  PROT 10.0* 10.4*  ALBUMIN 3.7 4.4    Lab 07/19/11 2053  LIPASE 2187*  AMYLASE 1038*    Lab 07/20/11 0248  AMMONIA 15   CBC:  Lab 07/19/11 2053 07/19/11 1806 07/19/11 1552  WBC 19.2* 18.3* --  NEUTROABS 14.0* -- 12.7*  HGB 20.0* 20.0* --  HCT 57.1* 60.3* --    MCV 98.3 102.4* --  PLT 209 189 --   Cardiac Enzymes:  Lab 07/20/11 0247 07/19/11 2053 07/19/11 1648 07/19/11 1551  CKTOTAL 165 199 185 --  CKMB 2.1 2.6 -- --  CKMBINDEX -- -- -- --  TROPONINI <0.30 <0.30 -- <0.30   BNP:  Lab 07/19/11 2053  PROBNP 65.9   CBG:  Lab 07/20/11 0507 07/20/11 0409 07/20/11 0306 07/20/11 0208 07/20/11 0110 07/19/11 2355  GLUCAP 201* 223* 322* 333* 440* 506*   Coagulation:  Lab 07/19/11 2053  LABPROT 15.5*  INR 1.20   Alcohol Level:  Lab 07/19/11 1552  ETH <11   Urinalysis:  Lab 07/19/11 2029 07/19/11 1549  COLORURINE YELLOW YELLOW  LABSPEC 1.039* 1.036*  PHURINE 5.5 5.0  GLUCOSEU >1000* >1000*  HGBUR SMALL* SMALL*  BILIRUBINUR NEGATIVE NEGATIVE  KETONESUR 15* 15*  PROTEINUR NEGATIVE 30*  UROBILINOGEN 1.0 1.0  NITRITE NEGATIVE NEGATIVE  LEUKOCYTESUR NEGATIVE NEGATIVE    HOSPITAL MEDICATIONS:  I have reviewed the patient's current medications.    Assessment and Plan: #DKA: New onset; AG remains elevated (24 --> 18), but unable to quantify chloride.  Changed IVF to D5 1/2 NS infusion with potassium @ 100cc/h, still on insulin drip & will transition to lantus as per protocol P:  cont hydration (with D5 1/2 NS and 1 bolus of NS) and insulin with potassium supplements; closely monitor BMET (q4h) until gap closes; AM HbA1c   #Hypernatremia: d/t volume depletion (evidenced by tachycardia), serum osm has improved from admission, so will proceed with D5 1/2 NS and with NS bolus P: cont to monitor BMET & cont IVF  #Tachycardia: likely d/t volume depletion, 2D echo for this AM, CE (-) x 3 P: cont IVF hydration and f/u 2D echo  #h/o COPD: No acute issue, but CXR suggests mild pulm vascular congestion P: cont advair, atrovent, albuterol   #Acute renal insufficiency: likely prerenal etiology as Cr trending down with IVF & mgmt of ketones (normal renal fxn at baseline) P: cont IVF hydration  #N/V/D: likely d/t norovirus vs pancreatitis  (patient does have h/o chronic EtOH abuse), no diarrhea since admission; CT reveals cholelithiasis P: cont IVF & protonix, maintain NPO status, add IV morphine for pain mgmt, d/c cipro/flagyl, consult GI   Best Practice: DVT: SCDs SUP: Protonix Nutrition: NPO Glycemic control: Insulin Sedation/analgesia: none  KAPADIA, NEEMA 07/20/2011, 7:40 AM   Pt seen and examined and database reviewed. I agree with above findings, assessment and plan. Discussed in detail on CCM rounds   Billy Fischer, MD;  PCCM service; Mobile [phone]

## 2011-07-20 NOTE — Progress Notes (Signed)
Inpatient Diabetes Program Recommendations  AACE/ADA: New Consensus Statement on Inpatient Glycemic Control  Target Ranges:  Prepandial:   less than 140 mg/dL      Peak postprandial:   less than 180 mg/dL (1-2 hours)      Critically ill patients:  140 - 180 mg/dL  Pager:  [phone] Hours:  8 am-10pm   Reason for Visit: New diagnosis of Diabetes  Inpatient Diabetes Program Recommendations HgbA1C: Check HgbA1C to assess glycemic control Outpatient Referral: Please order outpatient diabetes education

## 2011-07-20 NOTE — Progress Notes (Signed)
Patient pulled his foley catheter out with inflated balloon. Hematuria was noted after this event and a condom catheter was placed for the time being. Reinsertion of foley was avoided to avoid aggravating the injury. If patient continues to have hematuria, he may need a urological evaluation to evaluate and manage this traumatic urethral injury. Will need to be discussed with attending.  Lars Mage MD [facility] Internal Medicine Resident Pager [phone]

## 2011-07-20 NOTE — Progress Notes (Signed)
CRITICAL VALUE ALERT  Critical value received:Na168, CL >130  Date of notification: 07/20/11  Time of notification: 0700  Critical value read back:yes  Nurse who received alert: LBass  MD notified (1st page):  Dr. Milbert Coulter  Time of first page:  0700  MD notified (2nd page):  Time of second page:  Responding MD: Dr Milbert Coulter  Time MD responded: 0700

## 2011-07-21 ENCOUNTER — Inpatient Hospital Stay (HOSPITAL_COMMUNITY): Payer: Medicaid Other

## 2011-07-21 DIAGNOSIS — E870 Hyperosmolality and hypernatremia: Secondary | ICD-10-CM | POA: Diagnosis present

## 2011-07-21 DIAGNOSIS — K859 Acute pancreatitis without necrosis or infection, unspecified: Secondary | ICD-10-CM | POA: Diagnosis present

## 2011-07-21 LAB — GLUCOSE, CAPILLARY
Glucose-Capillary: 162 mg/dL — ABNORMAL HIGH (ref 70–99)
Glucose-Capillary: 192 mg/dL — ABNORMAL HIGH (ref 70–99)
Glucose-Capillary: 118 mg/dL — ABNORMAL HIGH (ref 70–99)
Glucose-Capillary: 184 mg/dL — ABNORMAL HIGH (ref 70–99)
Glucose-Capillary: 228 mg/dL — ABNORMAL HIGH (ref 70–99)
Glucose-Capillary: 230 mg/dL — ABNORMAL HIGH (ref 70–99)
Glucose-Capillary: 225 mg/dL — ABNORMAL HIGH (ref 70–99)

## 2011-07-21 LAB — URINALYSIS, ROUTINE W REFLEX MICROSCOPIC
Specific Gravity, Urine: 1.023 (ref 1.005–1.030)
Urobilinogen, UA: 2 mg/dL — ABNORMAL HIGH (ref 0.0–1.0)
pH: 6.5 (ref 5.0–8.0)

## 2011-07-21 LAB — BASIC METABOLIC PANEL
CO2: 19 mEq/L (ref 19–32)
CO2: 22 mEq/L (ref 19–32)
Chloride: 127 mEq/L — ABNORMAL HIGH (ref 96–112)
Chloride: 125 mEq/L — ABNORMAL HIGH (ref 96–112)
Creatinine, Ser: 0.83 mg/dL (ref 0.50–1.35)
GFR calc Af Amer: >90 mL/min (ref >90)
GFR calc Af Amer: >90 mL/min (ref >90)
Potassium: 3.8 mEq/L (ref 3.5–5.1)
Potassium: 4 mEq/L (ref 3.5–5.1)
Sodium: 160 mEq/L — ABNORMAL HIGH (ref 135–145)

## 2011-07-21 LAB — HEMOGLOBIN A1C: Mean Plasma Glucose: 260 mg/dL — ABNORMAL HIGH (ref <117)

## 2011-07-21 LAB — URINE MICROSCOPIC-ADD ON

## 2011-07-21 MED ORDER — ACETAMINOPHEN 650 MG RE SUPP
650.0000 mg | RECTAL | Status: DC | PRN
  Administered 2011-07-21 – 2011-07-22 (×3): 650 mg via RECTAL
  Filled 2011-07-21 (×3): qty 1

## 2011-07-21 MED ORDER — INSULIN ASPART 100 UNIT/ML ~~LOC~~ SOLN
0.0000 [IU] | SUBCUTANEOUS | Status: DC
  Administered 2011-07-21: 3 [IU] via SUBCUTANEOUS
  Administered 2011-07-21: 2 [IU] via SUBCUTANEOUS
  Administered 2011-07-21 (×2): 3 [IU] via SUBCUTANEOUS
  Administered 2011-07-21 – 2011-07-22 (×2): 5 [IU] via SUBCUTANEOUS
  Administered 2011-07-22: 3 [IU] via SUBCUTANEOUS
  Administered 2011-07-22: 5 [IU] via SUBCUTANEOUS
  Administered 2011-07-22: 3 [IU] via SUBCUTANEOUS
  Administered 2011-07-22: 7 [IU] via SUBCUTANEOUS
  Administered 2011-07-22: 3 [IU] via SUBCUTANEOUS
  Administered 2011-07-23: 7 [IU] via SUBCUTANEOUS
  Administered 2011-07-23: 9 [IU] via SUBCUTANEOUS
  Administered 2011-07-23: 7 [IU] via SUBCUTANEOUS
  Administered 2011-07-23: 5 [IU] via SUBCUTANEOUS
  Administered 2011-07-23 (×2): 7 [IU] via SUBCUTANEOUS

## 2011-07-21 MED ORDER — INSULIN GLARGINE 100 UNIT/ML ~~LOC~~ SOLN
10.0000 [IU] | Freq: Every day | SUBCUTANEOUS | Status: DC
  Administered 2011-07-21 (×2): 10 [IU] via SUBCUTANEOUS

## 2011-07-21 MED ORDER — SODIUM CHLORIDE 0.45 % IV BOLUS
1000.0000 mL | Freq: Once | INTRAVENOUS | Status: AC
  Administered 2011-07-21: 1000 mL via INTRAVENOUS

## 2011-07-21 NOTE — Progress Notes (Signed)
Nutrition Follow-up Brief Note  Patient still not appropriate for diabetes diet education at this time.  Will follow-up next week to complete diet education when patient is more alert and oriented.  Ethan Jordan [phone]

## 2011-07-21 NOTE — Progress Notes (Signed)
Patient is tachycardic into 140's. His heart rate has been 120's all day and Dr. Sung Amabile in his note today had mentioned about the possibility of him being dehydrated as one of the causes of sustained tachycardia. His EF was 55-60% with no structural abnormalities and his CE were negative times 3.   Vitals are BP 142/76  Pulse 143  Temp(Src) 99 F (37.2 C) (Oral)  Resp 39  Ht 5\' 11"  (1.803 m)  Wt 190 lb 14.7 oz (86.6 kg)  BMI 26.63 kg/m2  SpO2 92% Patient chest is clear at this time and his urine looks concentrated with a tinge of hematuria (2/2 him pulling out his foley yesterday). His mucus membranes look dry.  Patient is currently on SCD's and possibility of PE cannot be completely ruled out but we have other reasons to explain tachycardia at this time. We will give him a bolus of 1L 1/2 NS and reassess in 1-2 hours.   Lars Mage MD R3 Internal Medicine Resident Pager [phone]

## 2011-07-21 NOTE — Progress Notes (Signed)
Patient ID: Ethan Jordan, male   DOB: [birth year], 56 y.o.   MRN: [id number] Subjective: No acute events.  Objective: Vital signs in last 24 hours: Temp:  [98.1 F (36.7 C)-100.6 F (38.1 C)] 100.6 F (38.1 C) (04/05 0826) Pulse Rate:  [116-127] 127  (04/05 0800) Resp:  [20-33] 26  (04/05 0800) BP: (120-149)/(71-94) 143/78 mmHg (04/05 0800) SpO2:  [68 %-99 %] 97 % (04/05 0842) FiO2 (%):  [2 %-3 %] 2 % (04/05 0600) Weight:  [86.6 kg (190 lb 14.7 oz)] 86.6 kg (190 lb 14.7 oz) (04/05 0500) Last BM Date: 07/18/11  Intake/Output from previous day: 04/04 0701 - 04/05 0700 In: 5033.8 [I.V.:3403.8; IV Piggyback:1630] Out: 890 [Urine:890] Intake/Output this shift: Total I/O In: 150 [I.V.:150] Out: 100 [Urine:100]  General appearance: alert and mild distress GI: epigastric tenderness  Lab Results:  Basename 07/20/11 0738 07/19/11 2053 07/19/11 1806  WBC 21.4* 19.2* 18.3*  HGB 20.0* 20.0* 20.0*  HCT 57.0* 57.1* 60.3*  PLT 200 209 189   BMET  Basename 07/21/11 0510 07/20/11 2046 07/20/11 1734  NA 160* 166* 167*  K 3.8 4.5 4.7  CL 127* 130* >130*  CO2 19 24 23   GLUCOSE 195* 180* 160*  BUN 21 29* 33*  CREATININE 0.83 0.96 1.06  CALCIUM 8.8 9.8 10.1   LFT  Basename 07/19/11 2053  PROT 10.0*  ALBUMIN 3.7  AST 48*  ALT 47  ALKPHOS 161*  BILITOT 0.5  BILIDIR --  IBILI --   PT/INR  Basename 07/19/11 2053  LABPROT 15.5*  INR 1.20   Hepatitis Panel No results found for this basename: HEPBSAG,HCVAB,HEPAIGM,HEPBIGM in the last 72 hours C-Diff No results found for this basename: CDIFFTOX:3 in the last 72 hours Fecal Lactopherrin No results found for this basename: FECLLACTOFRN in the last 72 hours  Studies/Results: Ct Abdomen Pelvis Wo Contrast  07/20/2011  *RADIOLOGY REPORT*  Clinical Data: Diabetic ketoacidosis.  Abdominal pain.  Possible sepsis.  CT ABDOMEN AND PELVIS WITHOUT CONTRAST  Technique:  Multidetector CT imaging of the abdomen and pelvis was performed  following the standard protocol without intravenous contrast.  Comparison: 04/10/2009.  Findings: The lung bases demonstrate a small effusions and bibasilar atelectasis.  The heart is borderline enlarged.  The unenhanced appearance of the liver is unremarkable.  No focal lesions or biliary dilatation.  Small gallstones are noted in the gallbladder.  No distention or inflammation.  The common bile duct is normal in caliber.  The pancreas is mildly enlarged and inflamed with peripancreatic inflammatory changes also involving the second portion of the duodenum and also of the mesocolon.  Findings consistent with acute pancreatitis.  The spleen is normal in size.  No focal lesions.  The adrenal glands and kidneys are normal.  No renal or obstructing ureteral calculi.  The stomach is unremarkable.  The third and fourth portion of the duodenum are normal.  The small bowel is unremarkable.  Dense material is noted the colon.  This is likely ingested material such as Pepto-Bismol.  Mild fibrofatty infiltrative changes involving the colon may suggest a remote inflammatory bowel disease.  No active inflammation is demonstrated.  The appendix is normal. Dense material is noted in the appendix.  The aorta is normal in caliber.  Mild atherosclerotic changes.  No mesenteric or retroperitoneal masses or adenopathy.  There are small scattered lymph nodes typically in the periportal and celiac axis region.  There is a Foley catheter in the bladder.  The prostate gland and seminal vesicles appear normal.  There is artifact through the lower pelvis due to a right hip prosthesis.  No pelvic mass, adenopathy or free pelvic fluid collections.  No inguinal mass or hernia.  The bony structures are intact.  IMPRESSION:  1.  CT findings consistent with acute pancreatitis. 2.  Cholelithiasis. 3.  No other significant abdominal/pelvic findings. 4.  Small bilateral pleural effusions and bibasilar atelectasis.  Original Report Authenticated By:  P. Loralie Champagne, M.D.   Ct Head Wo Contrast  07/19/2011  *RADIOLOGY REPORT*  Clinical Data: Altered mental status.  CT HEAD WITHOUT CONTRAST  Technique:  Contiguous axial images were obtained from the base of the skull through the vertex without contrast.  Comparison: Head CT 03/29/2007.  Findings: The ventricles are normal.  No extra-axial fluid collections are seen.  The brainstem and cerebellum are unremarkable.  No acute intracranial findings such as infarction or hemorrhage.  No mass lesions.  The bony calvarium is intact.  The visualized paranasal sinuses and mastoid air cells are clear.  IMPRESSION: No acute intracranial findings or mass lesions.  No change since prior examination.  Original Report Authenticated By: P. Loralie Champagne, M.D.   Dg Chest Port 1 View  07/20/2011  *RADIOLOGY REPORT*  Clinical Data: Assess endotracheal tube.  PORTABLE CHEST - 1 VIEW  Comparison: 07/19/2011.  Findings: Endotracheal tube tip is not visualized on present exam.  Cardiomegaly.  Pulmonary vascular congestion.  No segmental consolidation.  No gross pneumothorax.  IMPRESSION: Endotracheal tube tip is not visualized on the present exam.  Cardiomegaly.  Pulmonary vascular congestion.  Original Report Authenticated By: Fuller Canada, M.D.   Dg Chest Port 1 View  07/19/2011  *RADIOLOGY REPORT*  Clinical Data: Chest pain.  Altered mental status.  PORTABLE CHEST - 1 VIEW  Comparison: Portable chest 01/10/2010.  Findings: The heart is mildly enlarged, as before.  The lung volumes have decreased.  Mild pulmonary vascular congestion is now present.  Mild bibasilar atelectasis is noted.  The right IJ line has been removed.  IMPRESSION:  1.  Borderline cardiomegaly and mild pulmonary vascular congestion. 2.  Low lung volumes. 3.  Mild bibasilar atelectasis without other focal airspace disease.  Original Report Authenticated By: Jamesetta Orleans. MATTERN, M.D.   Dg Chest Port 1v Same Day  07/19/2011  *RADIOLOGY REPORT*   Clinical Data: Aspiration.  PORTABLE CHEST - 1 VIEW SAME DAY  Comparison: Earlier film, same date.  Findings: The heart is borderline enlarged but stable.  There is mild vascular congestion without overt pulmonary edema.  Persistent bibasilar atelectasis.  No definite infiltrates or effusions.  IMPRESSION: Stable chest x-ray.  Original Report Authenticated By: P. Loralie Champagne, M.D.    Medications:  Scheduled:   . ipratropium  0.5 mg Nebulization Q4H   And  . albuterol  2.5 mg Nebulization Q4H  . antiseptic oral rinse  15 mL Mouth Rinse q12n4p  . chlorhexidine  15 mL Mouth Rinse BID  . Fluticasone-Salmeterol  1 puff Inhalation BID  . insulin aspart  0-9 Units Subcutaneous Q4H  . insulin glargine  10 Units Subcutaneous QHS  . living well with diabetes book   Does not apply Once  . pantoprazole (PROTONIX) IV  40 mg Intravenous Q24H  . potassium chloride  10 mEq Intravenous Q1 Hr x 3  . potassium phosphate IVPB (mmol)  20 mmol Intravenous Once  . sodium chloride  1,000 mL Intravenous Once  . DISCONTD: chlorhexidine      . DISCONTD: ciprofloxacin  400 mg Intravenous Q24H  .  DISCONTD: metronidazole  500 mg Intravenous Q8H   Continuous:   . sodium chloride 150 mL/hr (07/20/11 1903)  . DISCONTD: dextrose 5 % and 0.45 % NaCl with KCl 40 mEq/L 100 mL/hr (07/20/11 1500)  . DISCONTD: dextrose 5 % and 0.45% NaCl 100 mL/hr at 07/20/11 0845  . DISCONTD: dextrose 5 % and 0.45% NaCl 100 mL/hr (07/20/11 1800)  . DISCONTD: insulin (NOVOLIN-R) infusion 17 Units/hr (07/20/11 1500)  . DISCONTD: insulin (NOVOLIN-R) infusion 7.6 Units/hr (07/20/11 2256)  . DISCONTD: sodium chloride 0.45 % 1,000 mL with potassium chloride 40 mEq infusion Stopped (07/20/11 0500)    Assessment/Plan: 1) Gallstone pancreatitis, presumed. 2) HCV. 3) History of ETOH abuse.   The patient is still hemoconcentrated.  His output is quite low.  I still think he needs to be increased with his IV fluids.  Plan: 1) Increase IV  fluids per CCM. 2) Continue with supportive Rx.  LOS: 2 days   Compton Brigance D 07/21/2011, 9:47 AM

## 2011-07-21 NOTE — Progress Notes (Signed)
Name: Ethan Jordan MRN: [id number] DOB: [birth year] LOS: 2  PCCM PROGRESS NOTE  Background: 56 yo M on disability d/t history of substance abuse, hep C and etoh who p/w 2d of non bloody N/V/D. No fever/chills/night sweats but diffuse abdominal pain. Patient was brought in by his daughter d/t SOB and AMS that began on DOA.  Subjective: No acute events overnight.  RN reports mental status waxes and wanes. C/o abdominal pain this morning, but cannot describe.  Off insulin drip around 2am.  Lines, Tubes, etc: PIV 4/3  Microbiology: Blood Cx 4/3 >>>  Urine Cx 4/3 >>>  Sputum Cx 4/3>>> remains to be collected  Antibiotics:  Cipro 4/3>>> 4/4 Flagyl 4/3>>> 4/4   Studies/Events: Abd/Pelvic CT 4/3>>> acute pancreatitis, cholelithiasis  Consults: GI (Dr. Elnoria Howard)  Vital Signs: Filed Vitals:   07/21/11 0400 07/21/11 0424 07/21/11 0500 07/21/11 0600  BP: 126/79  135/82 131/80  Pulse: 122  122 119  Temp:  100 F (37.8 C)    TempSrc:  Oral    Resp: 23  28 23   Height:      Weight:   190 lb 14.7 oz (86.6 kg)   SpO2: 95%  96% 94%    Physical Examination: General:  No acute distress Neuro:  Oriented to time, place, person, president. Cardiovascular:  tachycardic Lungs:  B/l coarse breath sounds Abdomen:  Hyperactive BS, mildly tender to palpation, soft Musculoskeletal:  No UE/LE edema  Labs: Basic Metabolic Panel:  Lab 07/21/11 6578 07/20/11 2046 07/20/11 0650 07/19/11 2053  NA 160* 166* -- --  K 3.8 4.5 -- --  CL 127* 130* -- --  CO2 19 24 -- --  GLUCOSE 195* 180* -- --  BUN 21 29* -- --  CREATININE 0.83 0.96 -- --  CALCIUM 8.8 9.8 -- --  MG -- -- 3.5* 3.6*  PHOS -- -- 1.1* 2.6  AG = 14   Liver Function Tests:  Lab 07/19/11 2053 07/19/11 1552  AST 48* 52*  ALT 47 51  ALKPHOS 161* 170*  BILITOT 0.5 0.6  PROT 10.0* 10.4*  ALBUMIN 3.7 4.4     Lab 07/19/11 2053  LIPASE 2187*  AMYLASE 1038*    CBC:  Lab 07/20/11 0738 07/19/11 2053 07/19/11 1552  WBC  21.4* 19.2* --  NEUTROABS -- 14.0* 12.7*  HGB 20.0* 20.0* --  HCT 57.0* 57.1* --  MCV 96.9 98.3 --  PLT 200 209 --    Cardiac Enzymes:  Lab 07/20/11 0900 07/20/11 0247 07/19/11 2053  CKTOTAL 353* 165 199  CKMB 2.3 2.1 2.6  CKMBINDEX -- -- --  TROPONINI <0.30 <0.30 <0.30    BNP:  Lab 07/19/11 2053  PROBNP 65.9    CBG:  Lab 07/21/11 0424 07/21/11 0231 07/21/11 0119 07/21/11 0003 07/20/11 2255 07/20/11 2158  GLUCAP 184* 118* 192* 162* 169* 208*    Fasting Lipid Panel:  Lab 07/20/11 1630  CHOL 107  HDL 35*  LDLCALC 39  TRIG 469*  CHOLHDL 3.1  LDLDIRECT --    Coagulation:  Lab 07/19/11 2053  LABPROT 15.5*  INR 1.20    Alcohol Level:  Lab 07/19/11 1552  ETH <11    HOSPITAL MEDICATIONS:  I have reviewed the patient's current medications.     Assessment and Plan:  #DKA: New onset, HbA1c in process; AG closed overnight, but opened to 14 this AM. IVF increased to 150cc/h of 1/2 NS.  Phos repleted with potassium phos yesterday.  P: cont hydration & recheck BMET around 1pm; f/u  HbA1c, continue lantus & SSI   #Hypernatremia: d/t volume depletion (evidenced by tachycardia), serum osm has improved from admission, so will continue with with 1/2 NS to replete water deficit P: cont to monitor BMET & cont IVF   #Tachycardia: likely d/t volume depletion, 2D echo reveals normal LA with EF 55-65% & CE (-) x 3  P: cont IVF hydration   #Acute Pancreatitis: c/o N/V at admission; Question of chronic pancreatitis, as yesterday patient suggested he takes "pancreas medicine" outpatient; Yesterday he denied diarrhea; (patient does have h/o chronic EtOH abuse), no diarrhea since admission; CT reveals cholelithiasis, and GI was consulted, suggested elective gen surg consult for cholecystectomy (no role for ERCP in setting of normal LFTs; TG not impressive for pancreatitis.) Appreciate GI input. P: cont IVF, morphine & protonix, trial of clear liquid diet  today  #Hemoconcentration: likely d/t vomiting and DKA, continue IVF to correct to avoid microvascular hypoperfusion causing pancreatic necrosis  #h/o COPD: No acute issue, but CXR suggests mild pulm vascular congestion  P: cont advair, atrovent, albuterol   #Acute renal insufficiency: Resolved with IVF; likely prerenal etiology; Cr trending down with IVF & mgmt of ketones (normal renal fxn at baseline)  P: cont IVF hydration    Best Practice: DVT: SCD SUP: Protonix Nutrition: NPO Glycemic control: Insulin Sedation/analgesia: none  #Dispo: transfer to SDU today  KAPADIA, NEEMA 07/21/2011, 7:42 AM   Pt seen and examined and database reviewed. I agree with above findings, assessment and plan. Discussed in detail on CCM rounds  Billy Fischer, MD;  PCCM service; Mobile [phone]

## 2011-07-21 NOTE — Progress Notes (Signed)
Dr Milbert Coulter notified of sustained tachycardia.

## 2011-07-21 NOTE — Progress Notes (Signed)
Patient HR in 140s and temp of 103 at 8pm. MD Eben Burow and MD [facility] notified. Tylenol supp given and ice bags applied to core. At 10:20pm patients temp 103.2 HR 130s. Patient denies any pain.  MD Eben Burow notified.   Doree Albee

## 2011-07-22 ENCOUNTER — Inpatient Hospital Stay (HOSPITAL_COMMUNITY): Payer: Medicaid Other

## 2011-07-22 DIAGNOSIS — I10 Essential (primary) hypertension: Secondary | ICD-10-CM

## 2011-07-22 LAB — GLUCOSE, CAPILLARY
Glucose-Capillary: 202 mg/dL — ABNORMAL HIGH (ref 70–99)
Glucose-Capillary: 224 mg/dL — ABNORMAL HIGH (ref 70–99)

## 2011-07-22 LAB — CBC
HCT: 44.5 % (ref 39.0–52.0)
Hemoglobin: 14.5 g/dL (ref 13.0–17.0)
MCH: 32.9 pg (ref 26.0–34.0)
MCHC: 33.6 g/dL (ref 30.0–36.0)
Platelets: 94 10*3/uL — ABNORMAL LOW (ref 150–400)
RBC: 4.41 MIL/uL (ref 4.22–5.81)
RDW: 12.9 % (ref 11.5–15.5)
RDW: 13.1 % (ref 11.5–15.5)
WBC: 23.4 10*3/uL — ABNORMAL HIGH (ref 4.0–10.5)

## 2011-07-22 LAB — POCT I-STAT 3, ART BLOOD GAS (G3+)
Acid-base deficit: 7 mmol/L — ABNORMAL HIGH (ref 0.0–2.0)
O2 Saturation: 89 %
Patient temperature: 101.8

## 2011-07-22 LAB — BASIC METABOLIC PANEL
BUN: 20 mg/dL (ref 6–23)
CO2: 21 mEq/L (ref 19–32)
Calcium: 8 mg/dL — ABNORMAL LOW (ref 8.4–10.5)
Chloride: 115 mEq/L — ABNORMAL HIGH (ref 96–112)
Creatinine, Ser: 0.79 mg/dL (ref 0.50–1.35)
GFR calc Af Amer: >90 mL/min (ref >90)
GFR calc non Af Amer: >90 mL/min (ref >90)
Glucose, Bld: 254 mg/dL — ABNORMAL HIGH (ref 70–99)
Potassium: 3.7 mEq/L (ref 3.5–5.1)
Sodium: 146 mEq/L — ABNORMAL HIGH (ref 135–145)

## 2011-07-22 MED ORDER — FUROSEMIDE 10 MG/ML IJ SOLN
20.0000 mg | Freq: Once | INTRAMUSCULAR | Status: AC
  Administered 2011-07-22: 20 mg via INTRAVENOUS

## 2011-07-22 MED ORDER — VANCOMYCIN HCL IN DEXTROSE 1-5 GM/200ML-% IV SOLN
1000.0000 mg | Freq: Three times a day (TID) | INTRAVENOUS | Status: DC
  Administered 2011-07-22 – 2011-07-25 (×8): 1000 mg via INTRAVENOUS
  Filled 2011-07-22 (×12): qty 200

## 2011-07-22 MED ORDER — SODIUM CHLORIDE 0.9 % IV SOLN
INTRAVENOUS | Status: DC
  Administered 2011-07-22: 20 mL/h via INTRAVENOUS

## 2011-07-22 MED ORDER — PIPERACILLIN-TAZOBACTAM 3.375 G IVPB
3.3750 g | Freq: Three times a day (TID) | INTRAVENOUS | Status: DC
  Administered 2011-07-22 – 2011-07-25 (×9): 3.375 g via INTRAVENOUS
  Filled 2011-07-22 (×12): qty 50

## 2011-07-22 MED ORDER — FUROSEMIDE 10 MG/ML IJ SOLN
INTRAMUSCULAR | Status: AC
  Administered 2011-07-22: 20 mg via INTRAVENOUS
  Filled 2011-07-22: qty 2

## 2011-07-22 MED ORDER — INSULIN GLARGINE 100 UNIT/ML ~~LOC~~ SOLN
15.0000 [IU] | Freq: Every day | SUBCUTANEOUS | Status: DC
  Administered 2011-07-22: 15 [IU] via SUBCUTANEOUS

## 2011-07-22 NOTE — Progress Notes (Signed)
Speech Language Pathology Treatment  Bedside swallow evaluation completed.  Full report will be written. Recommending a Dys 2 diet and thin liquids, meds whole in applesauce, small sips with straw ok, rest breaks with SOB due to COPD MD please order if agree    Darrow Bussing.Ed [employer] [phone] 07/22/2011

## 2011-07-22 NOTE — Progress Notes (Signed)
[facility] Progress Note  Subjective: Febrile overnight, abx started  Some abd pain now, but less than yesterday Mild dyspnea  Objective:  Vital signs in last 24 hours: Temp:  [98.4 F (36.9 C)-103.2 F (39.6 C)] 101.8 F (38.8 C) (04/06 1208) Pulse Rate:  [113-143] 116  (04/06 1300) Resp:  [26-39] 29  (04/06 1300) BP: (118-156)/(65-93) 127/72 mmHg (04/06 1300) SpO2:  [87 %-100 %] 94 % (04/06 1300) Last BM Date: 07/18/11 Gen: awake, alert, mild dyspnea HEENT: anicteric, op clear CV: tachy, regular Pulm: course b/l with bibasilar crackles Abd: mod distention, hypoactive but present BS, epigastric tenderness Ext: no c/c/e Neuro: nonfocal   Intake/Output from previous day: 04/05 0701 - 04/06 0700 In: 5520 [P.O.:260; I.V.:5250; IV Piggyback:10] Out: 1515 [Urine:1515] Intake/Output this shift: Total I/O In: 720 [I.V.:720] Out: 125 [Urine:125]  Lab Results:  [facility] 07/22/11 0844 07/21/11 2312 07/20/11 0738  WBC 23.4* 21.2* 21.4*  HGB 14.5 15.1 20.0*  HCT 44.5 45.0 57.0*  PLT 76* 94* 200   BMET  Basename 07/22/11 0844 07/21/11 2312 07/21/11 1330  NA 146* 150* 160*  K 3.7 3.4* 4.0  CL 115* 119* 125*  CO2 21 19 22   GLUCOSE 198* 254* 218*  BUN 18 20 20   CREATININE 0.79 0.80 0.92  CALCIUM 7.8* 8.0* 8.5   LFT  Basename 07/19/11 2053  PROT 10.0*  ALBUMIN 3.7  AST 48*  ALT 47  ALKPHOS 161*  BILITOT 0.5  BILIDIR --  IBILI --   PT/INR  Basename 07/19/11 2053  LABPROT 15.5*  INR 1.20    Studies/Results: Dg Chest Port 1 View  07/21/2011  *RADIOLOGY REPORT*  Clinical Data: Fever, tachycardia  PORTABLE CHEST - 1 VIEW  Comparison: Portable chest x-ray of 07/20/2011  Findings: The lungs are not well aerated.  Moderate cardiomegaly is stable and there may be mild pulmonary vascular congestion present. No focal infiltrate or effusion is seen.  No bony abnormality is noted.  IMPRESSION: Stable cardiomegaly.  Question mild pulmonary vascular  congestion.  Original Report Authenticated By: Juline Patch, M.D.   Assessment / Plan: 1. Presumed gallstone pancreatitis -- imaging with normal CBD size, so thought is stone passed.  Getting IVFs with improvement in hypernatremia.  Vol overload now, but needs the vol with pancreatitis. Supportive care.  Will need cholecystectomy once acute issues improve.  2. DKA -- improved off insulin gtt  3. Hypernatremia -- improving with free water replacement  4. Fever -- cultures done, on empiric vanc/zosyn  Active Problems:  DKA (diabetic ketoacidoses)  Acute respiratory failure  Hypoxemia  Pulmonary edema  Acute renal failure  Tachycardia  HTN (hypertension)  Pancreatitis, acute  Hyperosmolality and hypernatremia     LOS: 3 days   Ethan Jordan M  07/22/2011, 1:51 PM

## 2011-07-22 NOTE — Progress Notes (Signed)
ANTIBIOTIC CONSULT NOTE - INITIAL  Pharmacy Consult for Vancomycin Zosyn Indication: empiric antibiotics  No Known Allergies  Patient Measurements: Height: 5\' 11"  (180.3 cm) Weight:  (bed needs to be rezeroed today, reading incorrectly) IBW/kg (Calculated) : 75.3  Weight:  83.1 kg  Vital Signs: Temp: 101.8 F (38.8 C) (04/06 1208) Temp src: Oral (04/06 1208) BP: 127/72 mmHg (04/06 1300) Pulse Rate: 116  (04/06 1300) Intake/Output from previous day: 04/05 0701 - 04/06 0700 In: 5520 [P.O.:260; I.V.:5250; IV Piggyback:10] Out: 1515 [Urine:1515] Intake/Output from this shift: Total I/O In: 720 [I.V.:720] Out: 125 [Urine:125]  Labs:  [facility] 07/22/11 0844 07/21/11 2312 07/21/11 1330 07/20/11 0738  WBC 23.4* 21.2* -- 21.4*  HGB 14.5 15.1 -- 20.0*  PLT 76* 94* -- 200  LABCREA -- -- -- --  CREATININE 0.79 0.80 0.92 --   Estimated Creatinine Clearance: 112.4 ml/min (by C-G formula based on Cr of 0.79). No results found for this basename: VANCOTROUGH:2,VANCOPEAK:2,VANCORANDOM:2,GENTTROUGH:2,GENTPEAK:2,GENTRANDOM:2,TOBRATROUGH:2,TOBRAPEAK:2,TOBRARND:2,AMIKACINPEAK:2,AMIKACINTROU:2,AMIKACIN:2, in the last 72 hours   Microbiology: Recent Results (from the past 720 hour(s))  CULTURE, BLOOD (ROUTINE X 2)     Status: Normal (Preliminary result)   Collection Time   07/19/11  4:30 PM      Component Value Range Status Comment   Specimen Description BLOOD ARM LEFT   Final    Special Requests BOTTLES DRAWN AEROBIC ONLY 5CC   Final    Culture  Setup Time [id number]   Final    Culture     Final    Value:        BLOOD CULTURE RECEIVED NO GROWTH TO DATE CULTURE WILL BE HELD FOR 5 DAYS BEFORE ISSUING A FINAL NEGATIVE REPORT   Report Status PENDING   Incomplete   CULTURE, BLOOD (ROUTINE X 2)     Status: Normal (Preliminary result)   Collection Time   07/19/11  4:45 PM      Component Value Range Status Comment   Specimen Description BLOOD HAND LEFT   Final    Special Requests BOTTLES DRAWN  AEROBIC ONLY 5CC   Final    Culture  Setup Time [id number]   Final    Culture     Final    Value:        BLOOD CULTURE RECEIVED NO GROWTH TO DATE CULTURE WILL BE HELD FOR 5 DAYS BEFORE ISSUING A FINAL NEGATIVE REPORT   Report Status PENDING   Incomplete   MRSA PCR SCREENING     Status: Normal   Collection Time   07/19/11  8:28 PM      Component Value Range Status Comment   MRSA by PCR NEGATIVE  NEGATIVE  Final   URINE CULTURE     Status: Normal   Collection Time   07/19/11  8:29 PM      Component Value Range Status Comment   Specimen Description URINE, CATHETERIZED   Final    Special Requests NONE   Final    Culture  Setup Time [id number]   Final    Colony Count NO GROWTH   Final    Culture NO GROWTH   Final    Report Status 07/20/2011 FINAL   Final   CULTURE, BLOOD (ROUTINE X 2)     Status: Normal (Preliminary result)   Collection Time   07/19/11  8:55 PM      Component Value Range Status Comment   Specimen Description BLOOD LEFT ARM   Final    Special Requests BOTTLES DRAWN AEROBIC AND ANAEROBIC 10CC  Final    Culture  Setup Time [id number]   Final    Culture     Final    Value:        BLOOD CULTURE RECEIVED NO GROWTH TO DATE CULTURE WILL BE HELD FOR 5 DAYS BEFORE ISSUING A FINAL NEGATIVE REPORT   Report Status PENDING   Incomplete   CULTURE, BLOOD (ROUTINE X 2)     Status: Normal (Preliminary result)   Collection Time   07/19/11  9:00 PM      Component Value Range Status Comment   Specimen Description BLOOD RIGHT ARM   Final    Special Requests BOTTLES DRAWN AEROBIC AND ANAEROBIC 10CC   Final    Culture  Setup Time [id number]   Final    Culture     Final    Value:        BLOOD CULTURE RECEIVED NO GROWTH TO DATE CULTURE WILL BE HELD FOR 5 DAYS BEFORE ISSUING A FINAL NEGATIVE REPORT   Report Status PENDING   Incomplete     Medical History: Past Medical History  Diagnosis Date  . Renal disorder   . Hepatitis C   . COPD (chronic obstructive pulmonary disease)     On  inhaled steroids  . Diabetes mellitus     Medications:  Prescriptions prior to admission  Medication Sig Dispense Refill  . Calcium Carbonate-Vitamin D (CALCIUM 600 + D PO) Take 3 tablets by mouth daily.      . fluticasone (FLONASE) 50 MCG/ACT nasal spray Place 2 sprays into the nose daily.      . Fluticasone-Salmeterol (ADVAIR) 250-50 MCG/DOSE AEPB Inhale 1 puff into the lungs every 12 (twelve) hours.      . Magnesium 250 MG TABS Take 1 tablet by mouth 2 (two) times daily.      . Mometasone Furo-Formoterol Fum (DULERA) 200-5 MCG/ACT AERO Inhale 2 puffs into the lungs 2 (two) times daily.      Marland Kitchen omeprazole (PRILOSEC) 40 MG capsule Take 40 mg by mouth daily.      . sucralfate (CARAFATE) 1 G tablet Take 1 g by mouth 4 (four) times daily.       Admit Complaint: 56 y.o.  male  admitted 07/19/2011 with likely DKA, worsening renal function and N/V/D, empiric GI antibiotics stopped 07/21/11 spiked fever overnight, pharmacy consulted to start vancomycin and zosyn .  Assessment: Infectious Disease: empiric restart for fever, and rising WBC Abx: cipro/flagyl 4/3>>4/4 Vanc/Zosyn 4/6 Cx: bld x2 4/3>> Ur cx 4/3>> No growth Sputum 4/3>> Stool 4/3>>  Endocrinology: DKA, DM, Lanuts, SSI Gastrointestinal: Mild acute pancreatitis, remains NPO PTA Medication Issues: Home Meds Not Ordered:  Best Practices: DVT Prophylaxis:  SCDs, IV protonix  Goal of Therapy:  Appropriate antibiotic dosing  Plan:  Zosyn 3.375g IV q8h infuse over 4h Vancomycin 1g IV q8h F/u renal function and clinical course.  Valary Manahan Merrily Brittle 07/22/2011,2:03 PM

## 2011-07-22 NOTE — Progress Notes (Signed)
Name: Ethan Jordan MRN: [id number] DOB: [birth year] LOS: 3  PCCM PROGRESS NOTE  Background: 56 yo M on disability d/t history of substance abuse, hep C and etoh who p/w 2d of non bloody N/V/D. No fever/chills/night sweats but diffuse abdominal pain. Patient was brought in by his daughter d/t SOB and AMS that began on DOA.  Subjective:  Temp spike 103 last night ,  Increased WOB this am. +~4L I/O balance    Lines, Tubes, etc: PIV 4/3  Microbiology: Blood Cx 4/3 >>>  Urine Cx 4/3 >>> NEG  Sputum Cx 4/3>>> remains to be collected BC x 2 4/5 (fever )  >  Antibiotics:  Cipro 4/3>>> 4/4 Flagyl 4/3>>> 4/4 Vanc (fever) 4/6 >> Zosyn (fever ) 4/6 >>  Studies/Events: Abd/Pelvic CT 4/3>>> acute pancreatitis, cholelithiasis  Consults: GI (Dr. Elnoria Howard)  Vital Signs: Filed Vitals:   07/22/11 1100 07/22/11 1147 07/22/11 1200 07/22/11 1208  BP: 131/76  133/80   Pulse: 121  117   Temp:    101.8 F (38.8 C)  TempSrc:    Oral  Resp: 39  35   Height:      Weight:      SpO2: 92% 92% 91%     Physical Examination: General: mild tachypneic  Neuro: Ox 3 , following simple commands  Cardiovascular:  tachycardic Lungs:  Coarse BS w/ bibasilar crackles, coarse  Abdomen:  Hyperactive BS, mildly tender to palpation, soft Musculoskeletal:  No UE/LE edema  Labs: Basic Metabolic Panel:  Lab 07/22/11 0347 07/21/11 2312 07/20/11 0650 07/19/11 2053  NA 146* 150* -- --  K 3.7 3.4* -- --  CL 115* 119* -- --  CO2 21 19 -- --  GLUCOSE 198* 254* -- --  BUN 18 20 -- --  CREATININE 0.79 0.80 -- --  CALCIUM 7.8* 8.0* -- --  MG -- -- 3.5* 3.6*  PHOS -- -- 1.1* 2.6  AG = 14   Liver Function Tests:  Lab 07/19/11 2053 07/19/11 1552  AST 48* 52*  ALT 47 51  ALKPHOS 161* 170*  BILITOT 0.5 0.6  PROT 10.0* 10.4*  ALBUMIN 3.7 4.4     Lab 07/19/11 2053  LIPASE 2187*  AMYLASE 1038*    CBC:  Lab 07/22/11 0844 07/21/11 2312 07/19/11 2053 07/19/11 1552  WBC 23.4* 21.2* -- --    NEUTROABS -- -- 14.0* 12.7*  HGB 14.5 15.1 -- --  HCT 44.5 45.0 -- --  MCV 100.9* 98.0 -- --  PLT 76* 94* -- --    Cardiac Enzymes:  Lab 07/20/11 0900 07/20/11 0247 07/19/11 2053  CKTOTAL 353* 165 199  CKMB 2.3 2.1 2.6  CKMBINDEX -- -- --  TROPONINI <0.30 <0.30 <0.30    BNP:  Lab 07/19/11 2053  PROBNP 65.9    CBG:  Lab 07/22/11 1153 07/22/11 0712 07/22/11 0425 07/22/11 0024 07/21/11 2014 07/21/11 1628  GLUCAP 272* 202* 259* 224* 271* 225*    Fasting Lipid Panel:  Lab 07/20/11 1630  CHOL 107  HDL 35*  LDLCALC 39  TRIG 425*  CHOLHDL 3.1  LDLDIRECT --    Coagulation:  Lab 07/19/11 2053  LABPROT 15.5*  INR 1.20    Alcohol Level:  Lab 07/19/11 1552  ETH <11    HOSPITAL MEDICATIONS:  I have reviewed the patient's current medications.    Assessment and Plan:  #DKA: New onset,(A1C -10.7)  4/6 : remains off insulin drip , bs avg 202-272  P:  continue SSI  -increase lantus to 15 u   #  Hypernatremia: d/t volume depletion (evidenced by tachycardia), serum osm has improved from admission,  4/6 NA tr down  P: cont to monitor BMET   #Tachycardia: likely d/t volume depletion, 2D echo reveals normal LA with EF 55-65% & CE (-) x 3  4/6 improving  P: cont to monitor   #Acute Pancreatitis: c/o N/V at admission; Question of chronic pancreatitis, as yesterday patient suggested he takes "pancreas medicine" outpatient;  (patient does have h/o chronic EtOH abuse), no diarrhea since admission; CT reveals cholelithiasis, and GI was consulted, suggested elective gen surg consult for cholecystectomy (no role for ERCP in setting of normal LFTs; TG not impressive for pancreatitis.) Appreciate GI input. P: cont  morphine & protonix,  clear liquid diet today-ST evaluation for D2 diet  Follow amylase /lipase   #Hemoconcentration: likely d/t vomiting and DKA,  4/6 improving   #h/o COPD: No acute issue, but CXR suggests mild pulm vascular congestion 4/6>increased wob with  increased O2 requirements., ? Volume overload (~4 L pos i/o bal )    P: cont advair, atrovent, albuterol  -cxr and abg stat  -lasix 20mg  IV x 1  -kvo IVF   #Acute renal insufficiency: Resolved with IVF; likely prerenal etiology; Cr trending down with IVF & mgmt of ketones (normal renal fxn at baseline)   Lab 07/22/11 0844 07/21/11 2312 07/21/11 1330 07/21/11 0510 07/20/11 2046  CREATININE 0.79 0.80 0.92 0.83 0.96    P:  Follow bmet   Fever -temp spike 4/6 -possible aspiration  -BC x2  -empiric vanc/zosyn  -cxr stat    Best Practice: DVT: SCD SUP: Protonix Nutrition: clear liquid  Glycemic control: Insulin Sedation/analgesia: none     PARRETT,TAMMY NP-C  07/22/2011, 12:40 PM  Patient seen and examined, agree with above note.  I dictated the care and orders written for this patient under my direction.  Koren Bound, M.D. [phone]

## 2011-07-22 NOTE — Evaluation (Signed)
Clinical/Bedside Swallow Evaluation Patient Details  Name: Ethan Jordan MRN: [id number] DOB: [birth year] Today's Date: 07/22/2011  HPI:  56 yr old admitted with AMS, N/V/D and SOB.  History of ETOH abuse, COPD, hep C.  Past Medical History:  Past Medical History  Diagnosis Date  . Renal disorder   . Hepatitis C   . COPD (chronic obstructive pulmonary disease)     On inhaled steroids  . Diabetes mellitus    Past Surgical History: History reviewed. No pertinent past surgical history.  Assessment/Recommendations/Treatment Plan   SLP Assessment Clinical Impression Statement: Pt. exhibited mild SOB during assessment most likely due to COPD with one dip in SPO2 saturation to 88%.  Pt. did not cough, have a wet vocal quality or cough during assessment.  Laryngeal elevation appeared decreased during palpation.  Pt. appears to be at a mild-mod aspiration risk given history of COPD and ETOH abuse.  Recommending pt. start on  a Dys 2 diet with thin liquids with adherence to swallow precautions such as sit upright, small sips, take frequent breaks for assurance of adequate respiration during meals. Risk for Aspiration: Moderate  Swallow Evaluation Recommendations Diet Recommendations: Dysphagia 2 (Fine chop);Thin liquid (Assess with higher textures if dentures brought from home)  Liquid Administration via: Cup;Straw Medication Administration: Whole meds with puree Supervision: Full supervision/cueing for compensatory strategies Compensations: Slow rate;Small sips/bites (Frequent rest breaks) Postural Changes and/or Swallow Maneuvers: Seated upright 90 degrees Oral Care Recommendations: Oral care BID Follow up Recommendations: None  Treatment Plan Treatment Plan Recommendations: Therapy as outlined in treatment plan below Speech Therapy Frequency: min 2x/week Treatment Duration: 2 weeks Interventions: Diet toleration management by SLP;Trials of upgraded texture/liquids;Patient/family  education;Compensatory techniques;Aspiration precaution training  Prognosis Prognosis for Safe Diet Advancement: Good  Individuals Consulted Consulted and Agree with Results and Recommendations: Patient  Swallowing Goals  SLP Swallowing Goals Patient will consume recommended diet without observed clinical signs of aspiration with: Minimal assistance Patient will utilize recommended strategies during swallow to increase swallowing safety with: Minimal assistance  Swallow Study   General  HPI: 56 yr old admitted with AMS, N/V/D and SOB.  History of ETOH abuse, COPD, hep C. Type of Study: Bedside swallow evaluation Diet Prior to this Study: NPO Temperature Spikes Noted: Yes Respiratory Status: Supplemental O2 delivered via (comment) Behavior/Cognition: Alert;Cooperative;Pleasant mood Oral Cavity - Dentition: Edentulous Vision: Functional for self-feeding Patient Positioning: Upright in bed Baseline Vocal Quality: Clear Volitional Cough: Strong Volitional Swallow: Able to elicit  Oral Motor/Sensory Function  Overall Oral Motor/Sensory Function: Appears within functional limits for tasks assessed  Consistency Results  Ice Chips Ice chips: Not tested  Thin Liquid Thin Liquid: Impaired Presentation: Cup;Straw Pharyngeal  Phase Impairments: Change in Vital Signs;Decreased hyoid-laryngeal movement (Spo2 dropped to 88% x 1)  Nectar Thick Liquid Nectar Thick Liquid: Not tested  Honey Thick Liquid Honey Thick Liquid: Not tested  Puree Puree: Within functional limits  Solid Solid: Impaired Oral Phase Impairments:  (delayed mastication)   Royce Macadamia M.Ed [employer] [phone] 07/22/2011

## 2011-07-23 ENCOUNTER — Inpatient Hospital Stay (HOSPITAL_COMMUNITY): Payer: Medicaid Other

## 2011-07-23 LAB — COMPREHENSIVE METABOLIC PANEL
Alkaline Phosphatase: 84 U/L (ref 39–117)
BUN: 17 mg/dL (ref 6–23)
Calcium: 8 mg/dL — ABNORMAL LOW (ref 8.4–10.5)
GFR calc Af Amer: >90 mL/min (ref >90)
Glucose, Bld: 279 mg/dL — ABNORMAL HIGH (ref 70–99)
Potassium: 3.4 mEq/L — ABNORMAL LOW (ref 3.5–5.1)
Total Protein: 6.5 g/dL (ref 6.0–8.3)

## 2011-07-23 LAB — AMYLASE: Amylase: 412 U/L — ABNORMAL HIGH (ref 0–105)

## 2011-07-23 LAB — CBC
MCH: 32.7 pg (ref 26.0–34.0)
MCV: 96.9 fL (ref 78.0–100.0)
Platelets: 67 10*3/uL — ABNORMAL LOW (ref 150–400)
RDW: 12.8 % (ref 11.5–15.5)

## 2011-07-23 LAB — GLUCOSE, CAPILLARY
Glucose-Capillary: 319 mg/dL — ABNORMAL HIGH (ref 70–99)
Glucose-Capillary: 328 mg/dL — ABNORMAL HIGH (ref 70–99)
Glucose-Capillary: 332 mg/dL — ABNORMAL HIGH (ref 70–99)
Glucose-Capillary: 409 mg/dL — ABNORMAL HIGH (ref 70–99)

## 2011-07-23 MED ORDER — ACETAMINOPHEN 325 MG PO TABS
650.0000 mg | ORAL_TABLET | Freq: Four times a day (QID) | ORAL | Status: DC | PRN
  Filled 2011-07-23: qty 2

## 2011-07-23 MED ORDER — INSULIN ASPART 100 UNIT/ML ~~LOC~~ SOLN
6.0000 [IU] | Freq: Once | SUBCUTANEOUS | Status: AC
  Administered 2011-07-23: 6 [IU] via SUBCUTANEOUS

## 2011-07-23 MED ORDER — POTASSIUM PHOSPHATE DIBASIC 3 MMOLE/ML IV SOLN
40.0000 meq | Freq: Once | INTRAVENOUS | Status: AC
  Administered 2011-07-23: 40 meq via INTRAVENOUS
  Filled 2011-07-23: qty 9.09

## 2011-07-23 MED ORDER — WHITE PETROLATUM GEL
Status: AC
  Administered 2011-07-23: 21:00:00
  Filled 2011-07-23: qty 5

## 2011-07-23 MED ORDER — INSULIN GLARGINE 100 UNIT/ML ~~LOC~~ SOLN
20.0000 [IU] | Freq: Every day | SUBCUTANEOUS | Status: DC
  Administered 2011-07-23: 20 [IU] via SUBCUTANEOUS

## 2011-07-23 MED ORDER — ALBUTEROL SULFATE (5 MG/ML) 0.5% IN NEBU
2.5000 mg | INHALATION_SOLUTION | Freq: Four times a day (QID) | RESPIRATORY_TRACT | Status: DC
  Administered 2011-07-23 – 2011-07-24 (×6): 2.5 mg via RESPIRATORY_TRACT
  Filled 2011-07-23 (×6): qty 0.5

## 2011-07-23 MED ORDER — IPRATROPIUM BROMIDE 0.02 % IN SOLN
0.5000 mg | Freq: Four times a day (QID) | RESPIRATORY_TRACT | Status: DC
  Administered 2011-07-23 – 2011-07-24 (×6): 0.5 mg via RESPIRATORY_TRACT
  Filled 2011-07-23 (×6): qty 2.5

## 2011-07-23 MED ORDER — IPRATROPIUM BROMIDE 0.02 % IN SOLN: 0.5000 mg | Freq: Four times a day (QID) | RESPIRATORY_TRACT | Status: DC

## 2011-07-23 MED ORDER — ALBUTEROL SULFATE (5 MG/ML) 0.5% IN NEBU: 2.5000 mg | INHALATION_SOLUTION | RESPIRATORY_TRACT | Status: DC

## 2011-07-23 NOTE — Progress Notes (Signed)
Ethan Jordan  Subjective: Fevers persist. Improved abd pain.  No other complains.  Objective:  Vital signs in last 24 hours: Temp:  [98.6 F (37 C)-101.5 F (38.6 C)] 101.5 F (38.6 C) (04/07 1600) Pulse Rate:  [75-126] 126  (04/07 1500) Resp:  [20-30] 27  (04/07 1500) BP: (93-139)/(54-97) 124/64 mmHg (04/07 1500) SpO2:  [91 %-100 %] 96 % (04/07 1500) FiO2 (%):  [93 %] 93 % (04/07 0358) Weight:  [195 lb 15.8 oz (88.9 kg)] 195 lb 15.8 oz (88.9 kg) (04/07 0500) Last BM Date: 07/22/11 Gen: awake, alert, mild dyspnea  HEENT: anicteric, op clear  CV: tachy, regular  Pulm: course b/l with bibasilar crackles  Abd: mod distention, hypoactive but present BS, less epigastric tenderness  Ext: no c/c/e  Neuro: nonfocal  Intake/Output from previous day: 04/06 0701 - 04/07 0700 In: 1645 [I.V.:920; IV Piggyback:725] Out: 2110 [Urine:2110] Intake/Output this shift: Total I/O In: 887.5 [I.V.:30; IV Piggyback:857.5] Out: 850 [Urine:850]  Lab Results:  [facility] 07/23/11 0540 07/22/11 0844 07/21/11 2312  WBC 15.7* 23.4* 21.2*  HGB 12.5* 14.5 15.1  HCT 37.0* 44.5 45.0  PLT 67* 76* 94*   BMET  Basename 07/23/11 0540 07/22/11 0844 07/21/11 2312  NA 144 146* 150*  K 3.4* 3.7 3.4*  CL 113* 115* 119*  CO2 19 21 19   GLUCOSE 279* 198* 254*  BUN 17 18 20   CREATININE 0.74 0.79 0.80  CALCIUM 8.0* 7.8* 8.0*   LFT  Basename 07/23/11 0540  PROT 6.5  ALBUMIN 2.2*  AST 40*  ALT 24  ALKPHOS 84  BILITOT 3.9*  BILIDIR --  IBILI --   PT/INR No results found for this basename: LABPROT:2,INR:2 in the last 72 hours Hepatitis Panel No results found for this basename: HEPBSAG,HCVAB,HEPAIGM,HEPBIGM in the last 72 hours  Studies/Results: Dg Chest Port 1 View  07/23/2011  *RADIOLOGY REPORT*  Clinical Data: Respiratory failure  PORTABLE CHEST - 1 VIEW  Comparison: 07/22/2011  Findings:  Heart size is normal.  There is no pleural effusion identified.  Bilateral lower lobe airspace opacities are again noted.  Compared with previous exam there is improved aeration to the right base.  IMPRESSION:  1.  Interval improvement in aeration to the right base. 2.  Persistent left base opacity.  Original Report Authenticated By: Rosealee Albee, M.D.   Dg Chest Port 1 View  07/21/2011  *RADIOLOGY REPORT*  Clinical Data: Fever, tachycardia  PORTABLE CHEST - 1 VIEW  Comparison: Portable chest x-ray of 07/20/2011  Findings: The lungs are not well aerated.  Moderate cardiomegaly is stable and there may be mild pulmonary vascular congestion present. No focal infiltrate or effusion is seen.  No bony abnormality is noted.  IMPRESSION: Stable cardiomegaly.  Question mild pulmonary vascular congestion.  Original Report Authenticated By: Juline Patch, M.D.   Dg Chest Port 1v Same Day  07/22/2011  *RADIOLOGY REPORT*  Clinical Data: Respiratory failure  PORTABLE CHEST - 1 VIEW SAME DAY  Comparison: 07/21/2011  Findings: Heart size appears normal.  No pleural effusion noted.  Streaky bibasilar opacities are unchanged from previous exam.  No focal airspace consolidation identified.  IMPRESSION:  1.  No change in aeration to the lung bases compared with prior exam.  Original Report Authenticated By: Rosealee Albee, M.D.     Assessment / Plan: 1. Presumed gallstone pancreatitis -- imaging with normal CBD size, so thought is stone passed. Supportive care and eventual cholecystecomy 2. DKA -- improved off  insulin gtt, per CCM 3. Hypernatremia -- improved  4. Fever -- cultures done, on empiric vanc/zosyn, question of PNA  Active Problems:  DKA (diabetic ketoacidoses)  Acute respiratory failure  Hypoxemia  Pulmonary edema  Acute renal failure  Tachycardia  HTN (hypertension)  Pancreatitis, acute  Hyperosmolality and hypernatremia     LOS: 4 days   Ethan Jordan M  07/23/2011, 5:53 PM

## 2011-07-23 NOTE — Progress Notes (Signed)
eLink Physician-Brief Progress Note Patient Name: Ethan Jordan DOB: 08/23/1955 MRN: [id number]  Date of Service  07/23/2011   HPI/Events of Note   Low phosphorous  eICU Interventions  k phosphate ordered.      Catha Brow 07/23/2011, 6:52 AM

## 2011-07-23 NOTE — Progress Notes (Signed)
Name: Ethan Jordan MRN: [id number] DOB: 08-10-1955 LOS: 4  PCCM PROGRESS NOTE  Background: 56 yo M on disability d/t history of substance abuse, hep C and etoh who p/w 2d of non bloody N/V/D. No fever/chills/night sweats but diffuse abdominal pain. Patient was brought in by his daughter d/t SOB and AMS that began on DOA.   Subjective: Reports abdominal pain is improved, febrile to 103.5 yesterday afternoon (not long after abx started), no complaints this morning  Lines, Tubes, etc: PIV 4/3  Microbiology: Blood Cx 4/3 >>>  Urine Cx 4/3 >>> NEG  BC x 2 4/5 (fever ) >  Antibiotics:  Cipro 4/3>>> 4/4  Flagyl 4/3>>> 4/4  Vanc (fever) 4/6 >>  Zosyn (fever ) 4/6 >>  Studies/Events: Abd/Pelvic CT 4/3>>> acute pancreatitis, cholelithiasis  Consults: GI (Dr. Elnoria Howard)  Vital Signs: Filed Vitals:   07/23/11 0600 07/23/11 0700 07/23/11 0749 07/23/11 0800  BP: 96/54 118/68  139/58  Pulse: 112 109  112  Temp:   100.2 F (37.9 C)   TempSrc:   Oral   Resp: 24 20  24   Height:      Weight:      SpO2: 93% 95%  99%    Physical Examination: General: mild tachypneic, febrile in last 24h Neuro: AAOx3 , follows commands (intermittent confusion per RN report) Cardiovascular: tachycardic (improved)  Lungs: poor air mvmt Abdomen: normoactive BS, mildly tender to palpation with voluntary gaurding Musculoskeletal: No UE/LE edema   Labs: Basic Metabolic Panel:  Lab 07/23/11 4098 07/22/11 0844 07/20/11 0650  NA 144 146* --  K 3.4* 3.7 --  CL 113* 115* --  CO2 19 21 --  GLUCOSE 279* 198* --  BUN 17 18 --  CREATININE 0.74 0.79 --  CALCIUM 8.0* 7.8* --  MG 1.8 -- 3.5*  PHOS 0.9* -- 1.1*  AG 12  Liver Function Tests:  Lab 07/23/11 0540 07/19/11 2053  AST 40* 48*  ALT 24 47  ALKPHOS 84 161*  BILITOT 3.9* 0.5  PROT 6.5 10.0*  ALBUMIN 2.2* 3.7    Lab 07/23/11 0540 07/19/11 2053  LIPASE 2040* 2187*  AMYLASE 412* 1038*   CBC:  Lab 07/23/11 0540 07/22/11 0844 07/19/11 2053  07/19/11 1552  WBC 15.7* 23.4* -- --  NEUTROABS -- -- 14.0* 12.7*  HGB 12.5* 14.5 -- --  HCT 37.0* 44.5 -- --  MCV 96.9 100.9* -- --  PLT 67* 76* -- --   CBG:  Lab 07/23/11 0730 07/23/11 0404 07/23/11 0007 07/22/11 2034 07/22/11 1553 07/22/11 1153  GLUCAP 319* 275* 332* 313* 249* 272*   Hemoglobin A1C:  Lab 07/21/11 0217  HGBA1C 10.7*    HOSPITAL MEDICATIONS:  I have reviewed the patient's current medications.    Assessment and Plan:  #DKA: New onset, HbA1c 10.7; AG closed. IVF d/c yesterday  P:increase lantus to 20 & SSI , start dys 2 diet and thin liquids  #Questionable PNA: patient febrile with leukocytosis yesterday with left base opacity; WBC trending down, cultures pending -dys 2 diet, continue empiric antibiotics  #Tachycardia: improving; likely d/t volume depletion & possible infection (increased wbc - trending down today, febrile), 2D echo reveals normal LA with EF 55-65% & CE (-) x 3  P: cont antibiotics   #Acute Pancreatitis: c/o N/V at admission; CT reveals cholelithiasis, and GI was consulted, suggested elective gen surg consult for cholecystectomy (no role for ERCP in setting of normal LFTs; TG not impressive for pancreatitis.) Appreciate GI input.  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  #Hypernatremia: Resolved; d/t volume depletion P: cont to monitor BMET  #Acute renal insufficiency: Resolved with IVF; likely prerenal etiology; Cr trending down with IVF & mgmt of ketones (normal renal fxn at baseline)  P: cont IVF hydration   Best Practice:  DVT: SCD  SUP: Protonix  Nutrition: NPO    Glycemic control: Insulin  Sedation/analgesia: none   #Dispo: transfer to SDU today  Best Practice: DVT: SCD SUP: Protonix Nutrition: NPO, dysphagia diet when ready Glycemic control: Insulin Sedation/analgesia: none  KAPADIA, NEEMA 07/23/2011, 9:10 AM  Transfer to SDU, start broad spectrum abx and monitor BS.  Will hold on PCCM service overnight, if vitals remain stable overnight can go to [facility] in AM.  Patient seen and examined, agree with above note.  I dictated the care and orders written for this patient under my direction.  Koren Bound, M.D. [phone]

## 2011-07-23 NOTE — Progress Notes (Signed)
CRITICAL VALUE ALERT  Critical value received:  Phosphorus 0.9  Date of notification:  07/23/2011  Time of notification:  0645  Critical value read back: yes  Nurse who received alert:  Berdine Dance  MD notified (1st page):  Blinda Leatherwood  Time of first page:  [phone]  MD notified (2nd page):  Time of second page:  Responding MD:  Blinda Leatherwood  Time MD responded:  [phone]

## 2011-07-24 DIAGNOSIS — E1010 Type 1 diabetes mellitus with ketoacidosis without coma: Secondary | ICD-10-CM

## 2011-07-24 DIAGNOSIS — K859 Acute pancreatitis without necrosis or infection, unspecified: Secondary | ICD-10-CM

## 2011-07-24 DIAGNOSIS — R000 Tachycardia, unspecified: Secondary | ICD-10-CM

## 2011-07-24 LAB — GLUCOSE, CAPILLARY
Glucose-Capillary: 322 mg/dL — ABNORMAL HIGH (ref 70–99)
Glucose-Capillary: 289 mg/dL — ABNORMAL HIGH (ref 70–99)
Glucose-Capillary: 343 mg/dL — ABNORMAL HIGH (ref 70–99)
Glucose-Capillary: 166 mg/dL — ABNORMAL HIGH (ref 70–99)

## 2011-07-24 LAB — CBC
HCT: 33.1 % — ABNORMAL LOW (ref 39.0–52.0)
Hemoglobin: 11.4 g/dL — ABNORMAL LOW (ref 13.0–17.0)
MCHC: 34.4 g/dL (ref 30.0–36.0)
RBC: 3.47 MIL/uL — ABNORMAL LOW (ref 4.22–5.81)
WBC: 15.2 10*3/uL — ABNORMAL HIGH (ref 4.0–10.5)

## 2011-07-24 LAB — BASIC METABOLIC PANEL
Chloride: 113 mEq/L — ABNORMAL HIGH (ref 96–112)
GFR calc non Af Amer: >90 mL/min (ref >90)
Glucose, Bld: 244 mg/dL — ABNORMAL HIGH (ref 70–99)
Potassium: 3 mEq/L — ABNORMAL LOW (ref 3.5–5.1)
Sodium: 142 mEq/L (ref 135–145)

## 2011-07-24 LAB — RETICULOCYTES
RBC.: 3.47 MIL/uL — ABNORMAL LOW (ref 4.22–5.81)
Retic Count, Absolute: 20.8 10*3/uL (ref 19.0–186.0)

## 2011-07-24 LAB — VITAMIN B12: Vitamin B-12: 691 pg/mL (ref 211–911)

## 2011-07-24 LAB — IRON AND TIBC
Iron: 159 ug/dL — ABNORMAL HIGH (ref 42–135)
UIBC: <15 ug/dL — ABNORMAL LOW (ref 125–400)

## 2011-07-24 LAB — FOLATE: Folate: 18.2 ng/mL

## 2011-07-24 MED ORDER — INSULIN ASPART 100 UNIT/ML ~~LOC~~ SOLN
0.0000 [IU] | SUBCUTANEOUS | Status: DC
  Administered 2011-07-24: 11 [IU] via SUBCUTANEOUS
  Administered 2011-07-24: 20 [IU] via SUBCUTANEOUS
  Administered 2011-07-24: 4 [IU] via SUBCUTANEOUS
  Administered 2011-07-24: 15 [IU] via SUBCUTANEOUS
  Administered 2011-07-24: 11 [IU] via SUBCUTANEOUS
  Administered 2011-07-25: 20 [IU] via SUBCUTANEOUS
  Administered 2011-07-25: 15 [IU] via SUBCUTANEOUS

## 2011-07-24 MED ORDER — BD GETTING STARTED TAKE HOME KIT: 1/2ML X 30G SYRINGES
1.0000 | Freq: Once | Status: AC
  Administered 2011-07-25: 1
  Filled 2011-07-24 (×3): qty 1

## 2011-07-24 MED ORDER — INSULIN GLARGINE 100 UNIT/ML ~~LOC~~ SOLN: 25.0000 [IU] | Freq: Every day | SUBCUTANEOUS | Status: DC

## 2011-07-24 MED ORDER — HYDROCODONE-ACETAMINOPHEN 5-325 MG PO TABS
1.0000 | ORAL_TABLET | ORAL | Status: DC | PRN
  Administered 2011-07-24 – 2011-08-04 (×12): 2 via ORAL
  Filled 2011-07-24 (×12): qty 2

## 2011-07-24 MED ORDER — POTASSIUM CHLORIDE CRYS ER 20 MEQ PO TBCR
EXTENDED_RELEASE_TABLET | ORAL | Status: AC
  Administered 2011-07-24: 30 meq
  Filled 2011-07-24: qty 2

## 2011-07-24 MED ORDER — INSULIN ASPART 100 UNIT/ML ~~LOC~~ SOLN
5.0000 [IU] | Freq: Three times a day (TID) | SUBCUTANEOUS | Status: DC
  Administered 2011-07-24: 5 [IU] via SUBCUTANEOUS

## 2011-07-24 MED ORDER — PANTOPRAZOLE SODIUM 40 MG PO TBEC
40.0000 mg | DELAYED_RELEASE_TABLET | Freq: Every day | ORAL | Status: DC
  Administered 2011-07-24 – 2011-08-04 (×12): 40 mg via ORAL
  Filled 2011-07-24 (×12): qty 1

## 2011-07-24 MED ORDER — POTASSIUM CHLORIDE CRYS ER 20 MEQ PO TBCR
30.0000 meq | EXTENDED_RELEASE_TABLET | ORAL | Status: AC
  Administered 2011-07-24: 30 meq via ORAL
  Filled 2011-07-24 (×2): qty 1

## 2011-07-24 NOTE — Progress Notes (Signed)
07/24/2011 patient transfer from 2100 to 6700 at 1741. Patient alert and orient, he seems as if he have some on and off confusion. Patient is up with assist, but is unsteady on feet. Patient have dry feet, dry patches in upper back. Excoriation scrotum , penis and groin area. epc cream was apply to the area. Sacrum is dry. Patient have a condom cath on, was told by 2100 nurse patient is incontinent. Patient was place on telemetry when arrive on unit. Biochemist, clinical.

## 2011-07-24 NOTE — Progress Notes (Signed)
Speech Language Pathology Dysphagia Treatment  Patient Details Name: Ethan Jordan MRN: [id number] DOB: [birth year] Today's Date: 07/24/2011  SLP Assessment/Plan/Recommendation  Patient appears to be tolerating a Dysphagia 2 diet with thin liquids.  Continue this diet for now, as patient does not want to wear his dentures to eat. Swallowing Goals   Continue current goals.  General  Patient was observed eating his lunch.  No overt s/s of aspiration noted with mashed potatoes, applesauce, or tea.  Patient does have low grade fever, but lung sounds are clear per RN.  Family brought dentures in, however, patient does not want to wear them as he states they are too loose and food gets underneath them (between his teeth and gums).      Dysphagia Treatment   Aspiration precautions and diet recommendations were reviewed.  Patient prefers to remain on Dys 2 diet, rather than use dentures and advance.  Maryjo Rochester T 07/24/2011, 3:02 PM

## 2011-07-24 NOTE — Progress Notes (Signed)
Nursing: Midnight CBG was 418. Dr. Darrick Penna in [place] notified. New orders received to change SSI to resistant scale and recheck CBG in 1 hr. 20 units Novolog given SQ. Follow-up CBG was 322. Results called to [facility]. Will continue to monitor. Alva Garnet, Addylynn Balin [place]

## 2011-07-24 NOTE — Progress Notes (Signed)
56 year old man on disability due to history of substance abuse, hep C and etoh who presents with the hospital with 2 days history of N/V/D non-bloody.  Diagnosed with diabetes this admission.  A1C 10.7% (07/21/11).  Has been extremely lethargic and not responsive to education over the weekend.  Today, patient much more awake and communicative.  Spoke with pt about new diagnosis.  Discussed A1C results with him and explained what an A1C is, basic pathophysiology of DM Type 2, basic home care, importance of checking CBGs and maintaining good CBG control to prevent long-term and short-term complications.  Reviewed proper technique for checking CBGs.  RNs to provide ongoing basic DM education at bedside with this patient.  Have ordered educational booklet, insulin starter kit, and DM videos.  RD to revisit patient now that he is more awake.    MD- Please place order for "Consult Diabetes OP Education" so pt may attend follow-up session with Certified Diabetes Educator after d/c at the [facility].  Will follow & assist  Ambrose Finland RN, MSN, CDE Diabetes Coordinator Inpatient Diabetes Program [phone]

## 2011-07-24 NOTE — Progress Notes (Signed)
Name: Ethan Jordan MRN: [id number] DOB: 29-May-1955 LOS: 5  PCCM PROGRESS NOTE  Background: 56 yo M on disability d/t history of substance abuse, hep C and etoh who p/w 2d of non bloody N/V/D. No fever/chills/night sweats but diffuse abdominal pain. Patient was brought in by his daughter d/t SOB and AMS that began on DOA.  Subjective: Feels well.  Denies significant abdominal pain. Tolerating diet. Tmax=101.5.   Lines, Tubes, etc: PIV 4/3  Microbiology: Blood Cx 4/3 >>>  Urine Cx 4/3 >>> NEG  BC x 2 4/5 (fever ) >  Results for orders placed during the hospital encounter of 07/19/11  CULTURE, BLOOD (ROUTINE X 2)     Status: Normal (Preliminary result)   Collection Time   07/19/11  4:30 PM      Component Value Range Status Comment   Specimen Description BLOOD ARM LEFT   Final    Special Requests BOTTLES DRAWN AEROBIC ONLY 5CC   Final    Culture  Setup Time [id number]   Final    Culture     Final    Value:        BLOOD CULTURE RECEIVED NO GROWTH TO DATE CULTURE WILL BE HELD FOR 5 DAYS BEFORE ISSUING A FINAL NEGATIVE REPORT   Report Status PENDING   Incomplete   CULTURE, BLOOD (ROUTINE X 2)     Status: Normal (Preliminary result)   Collection Time   07/19/11  4:45 PM      Component Value Range Status Comment   Specimen Description BLOOD HAND LEFT   Final    Special Requests BOTTLES DRAWN AEROBIC ONLY 5CC   Final    Culture  Setup Time [id number]   Final    Culture     Final    Value:        BLOOD CULTURE RECEIVED NO GROWTH TO DATE CULTURE WILL BE HELD FOR 5 DAYS BEFORE ISSUING A FINAL NEGATIVE REPORT   Report Status PENDING   Incomplete   MRSA PCR SCREENING     Status: Normal   Collection Time   07/19/11  8:28 PM      Component Value Range Status Comment   MRSA by PCR NEGATIVE  NEGATIVE  Final   URINE CULTURE     Status: Normal   Collection Time   07/19/11  8:29 PM      Component Value Range Status Comment   Specimen Description URINE, CATHETERIZED   Final    Special  Requests NONE   Final    Culture  Setup Time [id number]   Final    Colony Count NO GROWTH   Final    Culture NO GROWTH   Final    Report Status 07/20/2011 FINAL   Final   CULTURE, BLOOD (ROUTINE X 2)     Status: Normal (Preliminary result)   Collection Time   07/19/11  8:55 PM      Component Value Range Status Comment   Specimen Description BLOOD LEFT ARM   Final    Special Requests BOTTLES DRAWN AEROBIC AND ANAEROBIC 10CC   Final    Culture  Setup Time [id number]   Final    Culture     Final    Value:        BLOOD CULTURE RECEIVED NO GROWTH TO DATE CULTURE WILL BE HELD FOR 5 DAYS BEFORE ISSUING A FINAL NEGATIVE REPORT   Report Status PENDING   Incomplete   CULTURE, BLOOD (ROUTINE X 2)  Status: Normal (Preliminary result)   Collection Time   07/19/11  9:00 PM      Component Value Range Status Comment   Specimen Description BLOOD RIGHT ARM   Final    Special Requests BOTTLES DRAWN AEROBIC AND ANAEROBIC 10CC   Final    Culture  Setup Time [id number]   Final    Culture     Final    Value:        BLOOD CULTURE RECEIVED NO GROWTH TO DATE CULTURE WILL BE HELD FOR 5 DAYS BEFORE ISSUING A FINAL NEGATIVE REPORT   Report Status PENDING   Incomplete   CULTURE, BLOOD (ROUTINE X 2)     Status: Normal (Preliminary result)   Collection Time   07/21/11  8:45 PM      Component Value Range Status Comment   Specimen Description BLOOD LEFT HAND   Final    Special Requests BOTTLES DRAWN AEROBIC ONLY 5CC   Final    Culture  Setup Time [id number]   Final    Culture     Final    Value:        BLOOD CULTURE RECEIVED NO GROWTH TO DATE CULTURE WILL BE HELD FOR 5 DAYS BEFORE ISSUING A FINAL NEGATIVE REPORT   Report Status PENDING   Incomplete   CULTURE, BLOOD (ROUTINE X 2)     Status: Normal (Preliminary result)   Collection Time   07/21/11  8:55 PM      Component Value Range Status Comment   Specimen Description BLOOD RIGHT ARM   Final    Special Requests BOTTLES DRAWN AEROBIC ONLY 10CC   Final     Culture  Setup Time [id number]   Final    Culture     Final    Value:        BLOOD CULTURE RECEIVED NO GROWTH TO DATE CULTURE WILL BE HELD FOR 5 DAYS BEFORE ISSUING A FINAL NEGATIVE REPORT   Report Status PENDING   Incomplete     Lab 07/19/11 2046  PROCALCITON 0.14     Antibiotics:  Cipro 4/3>>> 4/4  Flagyl 4/3>>> 4/4  Vanc (fever) 4/6 >> plan to d/c on 4/14 Zosyn (fever ) 4/6 >> plan to d/c on 4/14  Studies/Events: Abd/Pelvic CT 4/3>>> acute pancreatitis, cholelithiasis  Consults: GI (Dr. Elnoria Howard)  Vital Signs: Filed Vitals:   07/24/11 0816 07/24/11 0900 07/24/11 0903 07/24/11 1000  BP:  98/58  94/54  Pulse:  113  107  Temp: 99.2 F (37.3 C)     TempSrc: Oral     Resp:      Height:      Weight:      SpO2:  95% 98% 92%    Intake/Output Summary (Last 24 hours) at 07/24/11 1059 Last data filed at 07/24/11 1000  Gross per 24 hour  Intake   1570 ml  Output   1406 ml  Net    164 ml   Physical Examination: General: comfortable in chair, NAD  Neuro: AAOx3 , follows commands  Cardiovascular: tachycardic  Lungs: poor air mvmt  Abdomen: normoactive BS, mildly tender to palpation with voluntary gaurding  Musculoskeletal: No UE/LE edema  Labs: Basic Metabolic Panel:  Lab 07/24/11 6295 07/23/11 0540 07/20/11 0650  NA 142 144 --  K 3.0* 3.4* --  CL 113* 113* --  CO2 20 19 --  GLUCOSE 244* 279* --  BUN 13 17 --  CREATININE 0.72 0.74 --  CALCIUM 8.7 8.0* --  MG --  1.8 3.5*  PHOS -- 0.9* 1.1*   Liver Function Tests:  Lab 07/23/11 0540 07/19/11 2053  AST 40* 48*  ALT 24 47  ALKPHOS 84 161*  BILITOT 3.9* 0.5  PROT 6.5 10.0*  ALBUMIN 2.2* 3.7    Lab 07/23/11 0540 07/19/11 2053  LIPASE 2040* 2187*  AMYLASE 412* 1038*   CBC:  Lab 07/24/11 0535 07/23/11 0540 07/19/11 2053 07/19/11 1552  WBC 15.2* 15.7* -- --  NEUTROABS -- -- 14.0* 12.7*  HGB 11.4* 12.5* -- --  HCT 33.1* 37.0* -- --  MCV 95.4 96.9 -- --  PLT 88* 67* -- --   CBG:  Lab 07/24/11 0813  07/24/11 0400 07/24/11 0142 07/24/11 0023 07/23/11 1949 07/23/11 1544  GLUCAP 343* 289* 322* 418* 345* 409*   Hemoglobin A1C:  Lab 07/21/11 0217  HGBA1C 10.7*   Fasting Lipid Panel:  Lab 07/20/11 1630  CHOL 107  HDL 35*  LDLCALC 39  TRIG 161*  CHOLHDL 3.1  LDLDIRECT --   Anemia Panel:  Lab 07/24/11 0535  VITAMINB12 --  FOLATE --  FERRITIN --  TIBC --  IRON --  RETICCTPCT 0.6   HOSPITAL MEDICATIONS:  I have reviewed the patient's current medications.   Assessment and Plan:  #DKA: New onset, HbA1c 10.7; AG closed.   P:increase lantus to 25, add 5u TIDWC for better control and can then plan to d/c with lantus qHS only & SSI   #Fever: Continues to be febrile; WBC stable, likely source is Left base PNA, cultures pending  P: continue empiric antibiotics (plan to treat until 4/14)  #Tachycardia: improving; likely d/t volume depletion & possible infection (WBC stable, febrile), 2D echo reveals normal LA with EF 55-65% & CE (-) x 3  P: cont antibiotics   #Acute Pancreatitis: c/o N/V at admission; CT reveals cholelithiasis, and GI was consulted, suggested elective gen surg consult for cholecystectomy (no role for ERCP in setting of normal LFTs; TG not impressive for pancreatitis.) Appreciate GI input.  P: continue dysphagia diet, protonix; transition to PO pain meds (vicodin)  #h/o COPD: No acute issue; increased WOB on 4/6, possibly d/t asp PNA; unlikely volume overload given normal renal and heart function, likely net in due to volume depletion PTA  P: cont advair, atrovent, albuterol   #Hemoconcentration: improved with IVF, Hb stable; now appears to be at real Hb suggesting normocytic anemia  -anemia panel pending   #Hypernatremia: Resolved; d/t volume depletion  P: cont to monitor BMET   #Acute renal insufficiency: Resolved with IVF; likely prerenal etiology; Cr trending down with IVF & mgmt of ketones (normal renal fxn at baseline)  P: cont PO hydration  Best  Practice:  DVT: SCD  SUP: Protonix  Nutrition: NPO  Glycemic control: Insulin  Sedation/analgesia: none   #Dispo: transfer to tele today (tachycardia), discussed with Dr. Sharon Seller with [facility], they will take over tomorrow morning.  PCCM signing off at that point.  Call if needed.   Best Practice:  DVT: SCD  SUP: Protonix  Nutrition: Dysphagia diet Glycemic control: Insulin  Sedation/analgesia: none   KAPADIA, NEEMA 07/24/2011, 10:59 AM   STAFF NOTE: I, Dr Lavinia Sharps have personally reviewed patient's available data, including medical history, events of note, physical examination and test results as part of my evaluation. I have discussed with resident/NP and other care providers such as pharmacist, RN and RRT.  In addition,  I personally evaluated patient and elicited key findings of *DKA and pacreatitis, both resolving and he appears  good for tele transfer (needs tele due to [facility]). Issue is fever low grade that persists.  ? Due to pancreatitis. Will get PCT and lactate tomorrow 07/25/11. [facility] will assume primary service 07/25/11. Also will check mag and phos 07/25/11.  Rest per NP/medical resident whose note is outlined above and that I agree with  Dr. Kalman Shan, M.D., [facility].C.P Pulmonary and Critical Care Medicine Staff Physician [facility] [place] [facility] Pager: [phone], If no answer or between  15:00h - 7:00h: call [phone]  07/24/2011 12:48 PM

## 2011-07-24 NOTE — Progress Notes (Signed)
eLink Physician-Brief Progress Note Patient Name: Ethan Jordan DOB: 1956-02-16 MRN: [id number]  Date of Service  07/24/2011   HPI/Events of Note  Ongoing hyperglycemia with blood sugars over the past hours of 418, 319 up to 345.  On sensitive SSI regimen.  Given 20 units of lanutus at 10 pm.   eICU Interventions  Plan: Change to resistant SSI Give 20 units of novolog now Recheck blood sugar in 1 hour and call elink MD with results.   Intervention Category Intermediate Interventions: Hyperglycemia - evaluation and treatment  Kenedee Molesky 07/24/2011, 12:36 AM

## 2011-07-24 NOTE — Progress Notes (Signed)
eLink Physician-Brief Progress Note Patient Name: Ethan Jordan DOB: 07/01/1955 MRN: [id number]  Date of Service  07/24/2011   HPI/Events of Note   hypokalemia  eICU Interventions  Potassium replaced   Intervention Category Intermediate Interventions: Electrolyte abnormality - evaluation and management  Kandace Elrod 07/24/2011, 6:41 AM

## 2011-07-25 ENCOUNTER — Inpatient Hospital Stay (HOSPITAL_COMMUNITY): Payer: Medicaid Other

## 2011-07-25 LAB — BASIC METABOLIC PANEL
BUN: 24 mg/dL — ABNORMAL HIGH (ref 6–23)
BUN: 26 mg/dL — ABNORMAL HIGH (ref 6–23)
CO2: 21 mEq/L (ref 19–32)
Calcium: 8.8 mg/dL (ref 8.4–10.5)
Chloride: 115 mEq/L — ABNORMAL HIGH (ref 96–112)
Creatinine, Ser: 2.18 mg/dL — ABNORMAL HIGH (ref 0.50–1.35)
GFR calc Af Amer: 38 mL/min — ABNORMAL LOW (ref >90)
GFR calc Af Amer: 30 mL/min — ABNORMAL LOW (ref >90)
GFR calc non Af Amer: 33 mL/min — ABNORMAL LOW (ref >90)
Glucose, Bld: 250 mg/dL — ABNORMAL HIGH (ref 70–99)
Potassium: 4.1 mEq/L (ref 3.5–5.1)

## 2011-07-25 LAB — GLUCOSE, CAPILLARY
Glucose-Capillary: 376 mg/dL — ABNORMAL HIGH (ref 70–99)
Glucose-Capillary: 201 mg/dL — ABNORMAL HIGH (ref 70–99)
Glucose-Capillary: 256 mg/dL — ABNORMAL HIGH (ref 70–99)

## 2011-07-25 LAB — CBC
HCT: 30.1 % — ABNORMAL LOW (ref 39.0–52.0)
MCH: 32.7 pg (ref 26.0–34.0)
MCHC: 33.9 g/dL (ref 30.0–36.0)
MCV: 96.5 fL (ref 78.0–100.0)
Platelets: 136 10*3/uL — ABNORMAL LOW (ref 150–400)
RDW: 12.5 % (ref 11.5–15.5)

## 2011-07-25 LAB — HEPATIC FUNCTION PANEL
ALT: 38 U/L (ref 0–53)
AST: 84 U/L — ABNORMAL HIGH (ref 0–37)
Alkaline Phosphatase: 119 U/L — ABNORMAL HIGH (ref 39–117)
Bilirubin, Direct: 0.7 mg/dL — ABNORMAL HIGH (ref 0.0–0.3)

## 2011-07-25 LAB — URINALYSIS, ROUTINE W REFLEX MICROSCOPIC
Nitrite: NEGATIVE
Specific Gravity, Urine: 1.018 (ref 1.005–1.030)
pH: 5.5 (ref 5.0–8.0)

## 2011-07-25 LAB — LACTIC ACID, PLASMA: Lactic Acid, Venous: 1.8 mmol/L (ref 0.5–2.2)

## 2011-07-25 LAB — MAGNESIUM: Magnesium: 2.3 mg/dL (ref 1.5–2.5)

## 2011-07-25 MED ORDER — INSULIN GLARGINE 100 UNIT/ML ~~LOC~~ SOLN
10.0000 [IU] | Freq: Once | SUBCUTANEOUS | Status: AC
  Administered 2011-07-25: 10 [IU] via SUBCUTANEOUS

## 2011-07-25 MED ORDER — DEXTROSE 50 % IV SOLN: 25.0000 mL | INTRAVENOUS | Status: DC | PRN

## 2011-07-25 MED ORDER — INSULIN GLARGINE 100 UNIT/ML ~~LOC~~ SOLN
10.0000 [IU] | Freq: Two times a day (BID) | SUBCUTANEOUS | Status: DC
  Administered 2011-07-25: 10 [IU] via SUBCUTANEOUS

## 2011-07-25 MED ORDER — INSULIN ASPART 100 UNIT/ML ~~LOC~~ SOLN
20.0000 [IU] | Freq: Once | SUBCUTANEOUS | Status: AC
  Administered 2011-07-25: 20 [IU] via SUBCUTANEOUS

## 2011-07-25 MED ORDER — INSULIN GLARGINE 100 UNIT/ML ~~LOC~~ SOLN: 10.0000 [IU] | Freq: Two times a day (BID) | SUBCUTANEOUS | Status: DC

## 2011-07-25 MED ORDER — INSULIN REGULAR BOLUS VIA INFUSION
0.0000 [IU] | Freq: Three times a day (TID) | INTRAVENOUS | Status: DC
  Filled 2011-07-25: qty 10

## 2011-07-25 MED ORDER — INSULIN ASPART 100 UNIT/ML ~~LOC~~ SOLN: 0.0000 [IU] | Freq: Every day | SUBCUTANEOUS | Status: DC

## 2011-07-25 MED ORDER — SODIUM CHLORIDE 0.9 % IV SOLN
INTRAVENOUS | Status: DC
  Administered 2011-07-26: 05:00:00 via INTRAVENOUS

## 2011-07-25 MED ORDER — DEXTROSE-NACL 5-0.45 % IV SOLN: INTRAVENOUS | Status: DC

## 2011-07-25 MED ORDER — K PHOS MONO-SOD PHOS DI & MONO 155-852-130 MG PO TABS
250.0000 mg | ORAL_TABLET | Freq: Three times a day (TID) | ORAL | Status: DC
  Administered 2011-07-25 – 2011-08-04 (×30): 250 mg via ORAL
  Filled 2011-07-25 (×33): qty 1

## 2011-07-25 MED ORDER — INSULIN ASPART 100 UNIT/ML ~~LOC~~ SOLN
0.0000 [IU] | Freq: Three times a day (TID) | SUBCUTANEOUS | Status: DC
  Administered 2011-07-25: 5 [IU] via SUBCUTANEOUS
  Administered 2011-07-26: 9 [IU] via SUBCUTANEOUS

## 2011-07-25 MED ORDER — SODIUM CHLORIDE 0.9 % IV SOLN
INTRAVENOUS | Status: DC
  Administered 2011-07-25: 11:00:00 via INTRAVENOUS

## 2011-07-25 MED ORDER — SODIUM CHLORIDE 0.9 % IV SOLN
INTRAVENOUS | Status: DC
  Administered 2011-07-25: 1.9 [IU]/h via INTRAVENOUS
  Administered 2011-07-25: 11:00:00 via INTRAVENOUS
  Administered 2011-07-25: 2.8 [IU]/h via INTRAVENOUS
  Filled 2011-07-25: qty 1

## 2011-07-25 NOTE — Progress Notes (Addendum)
Name: Ethan Jordan MRN: [id number] DOB: 1955-07-14 LOS: 6    Background: 56 yo M on disability d/t history of substance abuse, hep C and etoh who p/w 2d of non bloody N/V/D. No fever/chills/night sweats but diffuse abdominal pain. Admitted to the ICU on 07/19/11. Diagnosed with acute pancreatitis, DKA, new onset DM and pneumonia. Started on empiric abx.   Subjective: C/o back pain today    Microbiology: Blood Cx 4/3 >>>  Urine Cx 4/3 >>> NEG  BC x 2 4/5 (fever ) >  Results for orders placed during the hospital encounter of 07/19/11  CULTURE, BLOOD (ROUTINE X 2)     Status: Normal (Preliminary result)   Collection Time   07/19/11  4:30 PM      Component Value Range Status Comment   Specimen Description BLOOD ARM LEFT   Final    Special Requests BOTTLES DRAWN AEROBIC ONLY 5CC   Final    Culture  Setup Time [id number]   Final    Culture     Final    Value:        BLOOD CULTURE RECEIVED NO GROWTH TO DATE CULTURE WILL BE HELD FOR 5 DAYS BEFORE ISSUING A FINAL NEGATIVE REPORT   Report Status PENDING   Incomplete   CULTURE, BLOOD (ROUTINE X 2)     Status: Normal (Preliminary result)   Collection Time   07/19/11  4:45 PM      Component Value Range Status Comment   Specimen Description BLOOD HAND LEFT   Final    Special Requests BOTTLES DRAWN AEROBIC ONLY 5CC   Final    Culture  Setup Time [id number]   Final    Culture     Final    Value:        BLOOD CULTURE RECEIVED NO GROWTH TO DATE CULTURE WILL BE HELD FOR 5 DAYS BEFORE ISSUING A FINAL NEGATIVE REPORT   Report Status PENDING   Incomplete   MRSA PCR SCREENING     Status: Normal   Collection Time   07/19/11  8:28 PM      Component Value Range Status Comment   MRSA by PCR NEGATIVE  NEGATIVE  Final   URINE CULTURE     Status: Normal   Collection Time   07/19/11  8:29 PM      Component Value Range Status Comment   Specimen Description URINE, CATHETERIZED   Final    Special Requests NONE   Final    Culture  Setup Time [id number]    Final    Colony Count NO GROWTH   Final    Culture NO GROWTH   Final    Report Status 07/20/2011 FINAL   Final   CULTURE, BLOOD (ROUTINE X 2)     Status: Normal (Preliminary result)   Collection Time   07/19/11  8:55 PM      Component Value Range Status Comment   Specimen Description BLOOD LEFT ARM   Final    Special Requests BOTTLES DRAWN AEROBIC AND ANAEROBIC 10CC   Final    Culture  Setup Time [id number]   Final    Culture     Final    Value:        BLOOD CULTURE RECEIVED NO GROWTH TO DATE CULTURE WILL BE HELD FOR 5 DAYS BEFORE ISSUING A FINAL NEGATIVE REPORT   Report Status PENDING   Incomplete   CULTURE, BLOOD (ROUTINE X 2)     Status: Normal (Preliminary result)   Collection  Time   07/19/11  9:00 PM      Component Value Range Status Comment   Specimen Description BLOOD RIGHT ARM   Final    Special Requests BOTTLES DRAWN AEROBIC AND ANAEROBIC 10CC   Final    Culture  Setup Time [id number]   Final    Culture     Final    Value:        BLOOD CULTURE RECEIVED NO GROWTH TO DATE CULTURE WILL BE HELD FOR 5 DAYS BEFORE ISSUING A FINAL NEGATIVE REPORT   Report Status PENDING   Incomplete   CULTURE, BLOOD (ROUTINE X 2)     Status: Normal (Preliminary result)   Collection Time   07/21/11  8:45 PM      Component Value Range Status Comment   Specimen Description BLOOD LEFT HAND   Final    Special Requests BOTTLES DRAWN AEROBIC ONLY 5CC   Final    Culture  Setup Time [id number]   Final    Culture     Final    Value:        BLOOD CULTURE RECEIVED NO GROWTH TO DATE CULTURE WILL BE HELD FOR 5 DAYS BEFORE ISSUING A FINAL NEGATIVE REPORT   Report Status PENDING   Incomplete   CULTURE, BLOOD (ROUTINE X 2)     Status: Normal (Preliminary result)   Collection Time   07/21/11  8:55 PM      Component Value Range Status Comment   Specimen Description BLOOD RIGHT ARM   Final    Special Requests BOTTLES DRAWN AEROBIC ONLY 10CC   Final    Culture  Setup Time [id number]   Final    Culture      Final    Value:        BLOOD CULTURE RECEIVED NO GROWTH TO DATE CULTURE WILL BE HELD FOR 5 DAYS BEFORE ISSUING A FINAL NEGATIVE REPORT   Report Status PENDING   Incomplete     Lab 07/25/11 0630 07/19/11 2046  PROCALCITON 0.92 0.14     Antibiotics:  Cipro 4/3>>> 4/4  Flagyl 4/3>>> 4/4  Vanc (fever) 4/6 >> 4/9 Zosyn (fever ) 4/6 >> 4/9  Studies/Events: Abd/Pelvic CT 4/3>>> acute pancreatitis, cholelithiasis  Consults: GI (Dr. Elnoria Howard)  Clinical Data: Respiratory failure  PORTABLE CHEST - 1 VIEW  Comparison: 07/22/2011  Findings:  Heart size is normal. There is no pleural effusion identified.  Bilateral lower lobe airspace opacities are again noted. Compared  with previous exam there is improved aeration to the right base.  IMPRESSION:  1. Interval improvement in aeration to the right base.  2. Persistent left base opacity.     Vital Signs: Filed Vitals:   07/24/11 1733 07/24/11 2240 07/25/11 0500 07/25/11 0852  BP: 123/64 135/83 120/60   Pulse: 78 110 118   Temp: 98.5 F (36.9 C) 100 F (37.8 C) 99.4 F (37.4 C)   TempSrc: Oral  Oral   Resp: 20 20 19    Height: 5\' 8"  (1.727 m)     Weight: 88.7 kg (195 lb 8.8 oz)     SpO2: 95% 90% 100% 100%    Intake/Output Summary (Last 24 hours) at 07/25/11 0908 Last data filed at 07/25/11 0537  Gross per 24 hour  Intake   1060 ml  Output    485 ml  Net    575 ml   Physical Examination: Alert and oriented x3 CVs: RRR RS: ctab  Abdomen : soft, mild tenderness in RUQ  Labs:  Basic Metabolic Panel:  Lab 07/25/11 1610 07/24/11 0535 07/23/11 0540  NA 140 142 --  K 3.6 3.0* --  CL 111 113* --  CO2 18* 20 --  GLUCOSE 402* 244* --  BUN 24* 13 --  CREATININE 2.18* 0.72 --  CALCIUM 8.8 8.7 --  MG 2.3 -- 1.8  PHOS 1.1* -- 0.9*   Liver Function Tests:  Lab 07/23/11 0540 07/19/11 2053  AST 40* 48*  ALT 24 47  ALKPHOS 84 161*  BILITOT 3.9* 0.5  PROT 6.5 10.0*  ALBUMIN 2.2* 3.7    Lab 07/23/11 0540 07/19/11 2053    LIPASE 2040* 2187*  AMYLASE 412* 1038*   CBC:  Lab 07/25/11 0630 07/24/11 0535 07/19/11 2053 07/19/11 1552  WBC 18.2* 15.2* -- --  NEUTROABS -- -- 14.0* 12.7*  HGB 10.2* 11.4* -- --  HCT 30.1* 33.1* -- --  MCV 96.5 95.4 -- --  PLT 136* 88* -- --   CBG:  Lab 07/25/11 0814 07/25/11 0640 07/25/11 0431 07/25/11 0141 07/24/11 2049 07/24/11 1620  GLUCAP 344* 376* 437* 361* 166* 263*   Hemoglobin A1C:  Lab 07/21/11 0217  HGBA1C 10.7*   Fasting Lipid Panel:  Lab 07/20/11 1630  CHOL 107  HDL 35*  LDLCALC 39  TRIG 960*  CHOLHDL 3.1  LDLDIRECT --   Anemia Panel:  Lab 07/24/11 0535  VITAMINB12 691  FOLATE 18.2  FERRITIN 2857*  TIBC NOT CALC  IRON 159*  RETICCTPCT 0.6   HOSPITAL MEDICATIONS:     . antiseptic oral rinse  15 mL Mouth Rinse q12n4p  . bd getting started take home kit  1 kit Other Once  . chlorhexidine  15 mL Mouth Rinse BID  . Fluticasone-Salmeterol  1 puff Inhalation BID  . insulin aspart  20 Units Subcutaneous Once  . insulin regular  0-10 Units Intravenous TID WC  . pantoprazole  40 mg Oral Q1200  . phosphorus  250 mg Oral TID  . DISCONTD: albuterol  2.5 mg Nebulization QID  . DISCONTD: insulin aspart  0-20 Units Subcutaneous Q4H  . DISCONTD: insulin aspart  5 Units Subcutaneous TID WC  . DISCONTD: insulin glargine  25 Units Subcutaneous QHS  . DISCONTD: ipratropium  0.5 mg Nebulization QID  . DISCONTD: pantoprazole (PROTONIX) IV  40 mg Intravenous Q24H  . DISCONTD: piperacillin-tazobactam (ZOSYN)  IV  3.375 g Intravenous Q8H  . DISCONTD: vancomycin  1,000 mg Intravenous Q8H    Principal Problem:  *DKA (diabetic ketoacidoses) Active Problems:  Acute respiratory failure  Hypoxemia  Pulmonary edema  Acute renal failure  Tachycardia  HTN (hypertension)  Pancreatitis, acute  Hyperosmolality and hypernatremia  Hypophosphatemia    Assessment and Plan:  #DKA: New onset, HbA1c 10.7; Was on iv insulin  after admission - then transitioned to  [place] Lantus and Novolog. 07/25/11 with acidosis again CBGs running high, developing ARF - back on iv iculin and iv fluids   #Fever: on /off since admission ? Blood Cultures, urine culture no growth  Due to pneumonia vs pancreatitis. Had abx for 7 days total - stopped on 4/9 - Vanc level high on 4/9 so patient will be on vanc for a while. Monitor off abx for now.    #Acute Pancreatitis: c/o N/V at admission; CT reveals cholelithiasis, and GI was consulted, suggested elective gen surg consult for cholecystectomy (no role for ERCP in setting of normal LFTs; TG not impressive for pancreatitis.) Appreciate GI input.  P: back to clear liquids Consider repeating Ct abdomen  and pelvis    #h/o COPD: No acute issue; increased WOB on 4/6, possibly d/t asp PNA; unlikely volume overload given normal renal and heart function, likely net in due to volume depletion PTA  P: cont advair, atrovent, albuterol    #Acute renal insufficiency: present on admisson.  Resolved with IVF on4/6; likely prerenal etiology; Cr trending down with IVF & mgmt of ketones (normal renal fxn at baseline)  07/25/11 back with ARF - decrease UO - ? Side effects from abx - resume iv fluids  Check stat UA      Joella Saefong 07/25/2011, 9:08 AM

## 2011-07-25 NOTE — Progress Notes (Signed)
CRITICAL VALUE ALERT  Critical value received:  Vancomycin trough 35.2  Date of notification:  07/25/2011  Time of notification:  0842  Critical value read back:yes  Nurse who received alert:  Lovie Macadamia RN  MD notified (1st page):  Dr Lavera Guise  Time of first page:  0845  MD notified (2nd page):  Time of second page:  Responding MD:  Dr Lavera Guise  Time MD responded:  [phone]

## 2011-07-25 NOTE — Progress Notes (Signed)
Nursing Note  CBG 484. Spoke with Dr. Vania Rea; new orders for novolog SQ received. Pt to also receive lantus SQ at midnight. Stated it was unnecessary to obtain a lab glucose. Also noted that patient was receiving regular clear liquids instead of carb modified clear liquids, this was also modified. Will continue to monitor. C.Nahshon Reich, RN.

## 2011-07-25 NOTE — Progress Notes (Signed)
ANTIBIOTIC CONSULT NOTE - INITIAL  Pharmacy Consult for Vancomycin Zosyn Indication: empiric antibiotics  No Known Allergies  Patient Measurements: Height: 5\' 8"  (172.7 cm) Weight: 195 lb 8.8 oz (88.7 kg) IBW/kg (Calculated) : 68.4  Weight:  83.1 kg  Vital Signs: Temp: 99.4 F (37.4 C) (04/09 0500) Temp src: Oral (04/09 0500) BP: 120/60 mmHg (04/09 0500) Pulse Rate: 118  (04/09 0500) Intake/Output from previous day: 04/08 0701 - 04/09 0700 In: 1172.5 [P.O.:540; I.V.:70; IV Piggyback:562.5] Out: 546 [Urine:545; Stool:1] Intake/Output from this shift:    Labs:  Basename 07/25/11 0630 07/24/11 0535 07/23/11 0540  WBC 18.2* 15.2* 15.7*  HGB 10.2* 11.4* 12.5*  PLT 136* 88* 67*  LABCREA -- -- --  CREATININE 2.18* 0.72 0.74   Estimated Creatinine Clearance: 41.9 ml/min (by C-G formula based on Cr of 2.18).  Basename 07/25/11 0630  VANCOTROUGH 35.2*  VANCOPEAK --  Drue Dun --  GENTTROUGH --  GENTPEAK --  GENTRANDOM --  TOBRATROUGH --  TOBRAPEAK --  TOBRARND --  AMIKACINPEAK --  AMIKACINTROU --  AMIKACIN --     Microbiology: Recent Results (from the past 720 hour(s))  CULTURE, BLOOD (ROUTINE X 2)     Status: Normal (Preliminary result)   Collection Time   07/19/11  4:30 PM      Component Value Range Status Comment   Specimen Description BLOOD ARM LEFT   Final    Special Requests BOTTLES DRAWN AEROBIC ONLY 5CC   Final    Culture  Setup Time [id number]   Final    Culture     Final    Value:        BLOOD CULTURE RECEIVED NO GROWTH TO DATE CULTURE WILL BE HELD FOR 5 DAYS BEFORE ISSUING A FINAL NEGATIVE REPORT   Report Status PENDING   Incomplete   CULTURE, BLOOD (ROUTINE X 2)     Status: Normal (Preliminary result)   Collection Time   07/19/11  4:45 PM      Component Value Range Status Comment   Specimen Description BLOOD HAND LEFT   Final    Special Requests BOTTLES DRAWN AEROBIC ONLY 5CC   Final    Culture  Setup Time [id number]   Final    Culture      Final    Value:        BLOOD CULTURE RECEIVED NO GROWTH TO DATE CULTURE WILL BE HELD FOR 5 DAYS BEFORE ISSUING A FINAL NEGATIVE REPORT   Report Status PENDING   Incomplete   MRSA PCR SCREENING     Status: Normal   Collection Time   07/19/11  8:28 PM      Component Value Range Status Comment   MRSA by PCR NEGATIVE  NEGATIVE  Final   URINE CULTURE     Status: Normal   Collection Time   07/19/11  8:29 PM      Component Value Range Status Comment   Specimen Description URINE, CATHETERIZED   Final    Special Requests NONE   Final    Culture  Setup Time [id number]   Final    Colony Count NO GROWTH   Final    Culture NO GROWTH   Final    Report Status 07/20/2011 FINAL   Final   CULTURE, BLOOD (ROUTINE X 2)     Status: Normal (Preliminary result)   Collection Time   07/19/11  8:55 PM      Component Value Range Status Comment   Specimen Description BLOOD LEFT ARM  Final    Special Requests BOTTLES DRAWN AEROBIC AND ANAEROBIC 10CC   Final    Culture  Setup Time [id number]   Final    Culture     Final    Value:        BLOOD CULTURE RECEIVED NO GROWTH TO DATE CULTURE WILL BE HELD FOR 5 DAYS BEFORE ISSUING A FINAL NEGATIVE REPORT   Report Status PENDING   Incomplete   CULTURE, BLOOD (ROUTINE X 2)     Status: Normal (Preliminary result)   Collection Time   07/19/11  9:00 PM      Component Value Range Status Comment   Specimen Description BLOOD RIGHT ARM   Final    Special Requests BOTTLES DRAWN AEROBIC AND ANAEROBIC 10CC   Final    Culture  Setup Time [id number]   Final    Culture     Final    Value:        BLOOD CULTURE RECEIVED NO GROWTH TO DATE CULTURE WILL BE HELD FOR 5 DAYS BEFORE ISSUING A FINAL NEGATIVE REPORT   Report Status PENDING   Incomplete   CULTURE, BLOOD (ROUTINE X 2)     Status: Normal (Preliminary result)   Collection Time   07/21/11  8:45 PM      Component Value Range Status Comment   Specimen Description BLOOD LEFT HAND   Final    Special Requests BOTTLES DRAWN AEROBIC  ONLY 5CC   Final    Culture  Setup Time [id number]   Final    Culture     Final    Value:        BLOOD CULTURE RECEIVED NO GROWTH TO DATE CULTURE WILL BE HELD FOR 5 DAYS BEFORE ISSUING A FINAL NEGATIVE REPORT   Report Status PENDING   Incomplete   CULTURE, BLOOD (ROUTINE X 2)     Status: Normal (Preliminary result)   Collection Time   07/21/11  8:55 PM      Component Value Range Status Comment   Specimen Description BLOOD RIGHT ARM   Final    Special Requests BOTTLES DRAWN AEROBIC ONLY 10CC   Final    Culture  Setup Time [id number]   Final    Culture     Final    Value:        BLOOD CULTURE RECEIVED NO GROWTH TO DATE CULTURE WILL BE HELD FOR 5 DAYS BEFORE ISSUING A FINAL NEGATIVE REPORT   Report Status PENDING   Incomplete     Medical History: Past Medical History  Diagnosis Date  . Renal disorder   . Hepatitis C   . COPD (chronic obstructive pulmonary disease)     On inhaled steroids  . Diabetes mellitus     Medications:  Prescriptions prior to admission  Medication Sig Dispense Refill  . Calcium Carbonate-Vitamin D (CALCIUM 600 + D PO) Take 3 tablets by mouth daily.      . fluticasone (FLONASE) 50 MCG/ACT nasal spray Place 2 sprays into the nose daily.      . Fluticasone-Salmeterol (ADVAIR) 250-50 MCG/DOSE AEPB Inhale 1 puff into the lungs every 12 (twelve) hours.      . Magnesium 250 MG TABS Take 1 tablet by mouth 2 (two) times daily.      . Mometasone Furo-Formoterol Fum (DULERA) 200-5 MCG/ACT AERO Inhale 2 puffs into the lungs 2 (two) times daily.      Marland Kitchen omeprazole (PRILOSEC) 40 MG capsule Take 40 mg by mouth daily.      Marland Kitchen  sucralfate (CARAFATE) 1 G tablet Take 1 g by mouth 4 (four) times daily.       Admit Complaint: 56 y.o.  male  admitted 07/19/2011 with likely DKA, worsening renal function and N/V/D, empiric GI antibiotics stopped 07/21/11.  Vancomycin and zosyn started empirically 4/6.  Assessment: SrCr increased this am and vanc trough elevated at 35.2 mg/L Abx:  cipro/flagyl 4/3>>4/4 Vanc/Zosyn 4/6 Cx: Blood culture ngtd Ur cx 4/3>> No growth   Endocrinology: DKA, DM, Lanuts, SSI Gastrointestinal: Mild acute pancreatitis, carb modified diet Best Practices: DVT Prophylaxis:  SCDs, IV protonix  Goal of Therapy:  Appropriate antibiotic dosing  Plan:  Hold vancomycin for now Recheck vanc level in am F/u renal function and clinical course. Continue zosyn.  Talbert Cage Poteet 07/25/2011,8:53 AM

## 2011-07-25 NOTE — Progress Notes (Signed)
07/25/11 Nursing Note  CBG 437. Spoke with Dr. Adela Glimpse who ordered 20 units SQ novolog x1 dose. Stated it was unnecessary to obtain stat lab glucose. Will recheck in 1 to 1 1/2 hrs per orders. Will monitor. C.Rhonna Holster, RN.

## 2011-07-25 NOTE — Progress Notes (Signed)
07/25/2011 Dr Lavera Guise is also aware of patient creatine and Bun. Patient bun on 07/24/2011 was 13 and on 07/25/2011 it was 24. Creatine on 07/24/2011 0.72 and on 07/25/2011 was 2.18. Biochemist, clinical.

## 2011-07-26 ENCOUNTER — Encounter (HOSPITAL_COMMUNITY): Payer: Self-pay | Admitting: Family Medicine

## 2011-07-26 ENCOUNTER — Telehealth (INDEPENDENT_AMBULATORY_CARE_PROVIDER_SITE_OTHER): Payer: Self-pay

## 2011-07-26 ENCOUNTER — Inpatient Hospital Stay (HOSPITAL_COMMUNITY): Payer: Medicaid Other

## 2011-07-26 DIAGNOSIS — K8010 Calculus of gallbladder with chronic cholecystitis without obstruction: Secondary | ICD-10-CM

## 2011-07-26 LAB — BASIC METABOLIC PANEL
BUN: 27 mg/dL — ABNORMAL HIGH (ref 6–23)
CO2: 18 mEq/L — ABNORMAL LOW (ref 19–32)
CO2: 17 mEq/L — ABNORMAL LOW (ref 19–32)
Calcium: 9.1 mg/dL (ref 8.4–10.5)
Chloride: 109 mEq/L (ref 96–112)
Chloride: 111 mEq/L (ref 96–112)
Chloride: 111 mEq/L (ref 96–112)
Creatinine, Ser: 2.86 mg/dL — ABNORMAL HIGH (ref 0.50–1.35)
GFR calc Af Amer: 27 mL/min — ABNORMAL LOW (ref >90)
GFR calc Af Amer: 28 mL/min — ABNORMAL LOW (ref >90)
GFR calc Af Amer: 27 mL/min — ABNORMAL LOW (ref >90)
GFR calc non Af Amer: 23 mL/min — ABNORMAL LOW (ref >90)
Glucose, Bld: 407 mg/dL — ABNORMAL HIGH (ref 70–99)
Glucose, Bld: 191 mg/dL — ABNORMAL HIGH (ref 70–99)
Potassium: 3.4 mEq/L — ABNORMAL LOW (ref 3.5–5.1)
Potassium: 3 mEq/L — ABNORMAL LOW (ref 3.5–5.1)
Potassium: 2.6 mEq/L — CL (ref 3.5–5.1)
Sodium: 143 mEq/L (ref 135–145)
Sodium: 141 mEq/L (ref 135–145)

## 2011-07-26 LAB — CULTURE, BLOOD (ROUTINE X 2)
Culture  Setup Time: 201304040127
Culture  Setup Time: 201304040128
Culture: NO GROWTH

## 2011-07-26 LAB — CBC
HCT: 27.6 % — ABNORMAL LOW (ref 39.0–52.0)
Hemoglobin: 8.9 g/dL — ABNORMAL LOW (ref 13.0–17.0)
Hemoglobin: 9.5 g/dL — ABNORMAL LOW (ref 13.0–17.0)
MCH: 32.6 pg (ref 26.0–34.0)
MCHC: 34.4 g/dL (ref 30.0–36.0)
MCV: 97.1 fL (ref 78.0–100.0)
RBC: 2.73 MIL/uL — ABNORMAL LOW (ref 4.22–5.81)
RBC: 2.91 MIL/uL — ABNORMAL LOW (ref 4.22–5.81)
WBC: 20.9 10*3/uL — ABNORMAL HIGH (ref 4.0–10.5)

## 2011-07-26 LAB — GLUCOSE, CAPILLARY
Glucose-Capillary: 159 mg/dL — ABNORMAL HIGH (ref 70–99)
Glucose-Capillary: 165 mg/dL — ABNORMAL HIGH (ref 70–99)
Glucose-Capillary: 167 mg/dL — ABNORMAL HIGH (ref 70–99)
Glucose-Capillary: 193 mg/dL — ABNORMAL HIGH (ref 70–99)
Glucose-Capillary: 285 mg/dL — ABNORMAL HIGH (ref 70–99)
Glucose-Capillary: 430 mg/dL — ABNORMAL HIGH (ref 70–99)
Glucose-Capillary: 458 mg/dL — ABNORMAL HIGH (ref 70–99)
Glucose-Capillary: 465 mg/dL — ABNORMAL HIGH (ref 70–99)

## 2011-07-26 LAB — HEPATIC FUNCTION PANEL
Alkaline Phosphatase: 167 U/L — ABNORMAL HIGH (ref 39–117)
Bilirubin, Direct: 0.6 mg/dL — ABNORMAL HIGH (ref 0.0–0.3)
Indirect Bilirubin: 0.6 mg/dL (ref 0.3–0.9)
Total Protein: 7.4 g/dL (ref 6.0–8.3)

## 2011-07-26 LAB — VANCOMYCIN, RANDOM: Vancomycin Rm: 18.5 ug/mL

## 2011-07-26 MED ORDER — SODIUM CHLORIDE 0.9 % IV SOLN
500.0000 mg | Freq: Two times a day (BID) | INTRAVENOUS | Status: DC
  Administered 2011-07-26 – 2011-07-27 (×5): 500 mg via INTRAVENOUS
  Filled 2011-07-26 (×6): qty 500

## 2011-07-26 MED ORDER — INSULIN REGULAR BOLUS VIA INFUSION
0.0000 [IU] | Freq: Three times a day (TID) | INTRAVENOUS | Status: DC
  Filled 2011-07-26: qty 10

## 2011-07-26 MED ORDER — SODIUM CHLORIDE 0.9 % IV SOLN
INTRAVENOUS | Status: DC
  Filled 2011-07-26: qty 1

## 2011-07-26 MED ORDER — SODIUM CHLORIDE 0.45 % IV SOLN
INTRAVENOUS | Status: DC
  Administered 2011-07-26: 10:00:00 via INTRAVENOUS

## 2011-07-26 MED ORDER — INSULIN GLARGINE 100 UNIT/ML ~~LOC~~ SOLN: 20.0000 [IU] | Freq: Two times a day (BID) | SUBCUTANEOUS | Status: DC

## 2011-07-26 MED ORDER — INSULIN GLARGINE 100 UNIT/ML ~~LOC~~ SOLN
25.0000 [IU] | Freq: Every day | SUBCUTANEOUS | Status: DC
  Administered 2011-07-27 (×2): 25 [IU] via SUBCUTANEOUS

## 2011-07-26 MED ORDER — SODIUM CHLORIDE 0.9 % IV SOLN
INTRAVENOUS | Status: DC
  Administered 2011-07-26: 4 [IU]/h via INTRAVENOUS
  Filled 2011-07-26: qty 1

## 2011-07-26 MED ORDER — POTASSIUM CHLORIDE 10 MEQ/100ML IV SOLN
10.0000 meq | INTRAVENOUS | Status: AC
  Administered 2011-07-27 (×4): 10 meq via INTRAVENOUS
  Filled 2011-07-26 (×4): qty 100

## 2011-07-26 MED ORDER — DEXTROSE-NACL 5-0.45 % IV SOLN
INTRAVENOUS | Status: DC
  Administered 2011-07-26: 15:00:00 via INTRAVENOUS

## 2011-07-26 MED ORDER — FUROSEMIDE 10 MG/ML IJ SOLN
100.0000 mg | Freq: Once | INTRAVENOUS | Status: AC
  Administered 2011-07-26: 100 mg via INTRAVENOUS
  Filled 2011-07-26: qty 10

## 2011-07-26 MED ORDER — DEXTROSE 50 % IV SOLN: 25.0000 mL | INTRAVENOUS | Status: DC | PRN

## 2011-07-26 NOTE — Progress Notes (Signed)
Inpatient Diabetes Program Recommendations  AACE/ADA: New Consensus Statement on Inpatient Glycemic Control (2009)  Target Ranges:  Prepandial:   less than 140 mg/dL      Peak postprandial:   less than 180 mg/dL (1-2 hours)      Critically ill patients:  140 - 180 mg/dL   Reason for Visit: Hyperlgycemia and DKA Insulin drip rate ideally should be less than 5 units per hour and glucose controlled for 4 straight hours before insulin drip is discontinued.  Inpatient Diabetes Program Recommendations Insulin - Basal: Please be sure that Lantus at least 25 units is ordered 1-2 hrs  before drip is d/c'd.this time. Continue insulin drip until acidosis is resolved. Correction (SSI): xxx HgbA1C: xxx Outpatient Referral: Please order outpatient diabetes education Diet: xxx  Note: Thank you, Lenor Coffin, RN, CNS, Diabetes Coordinator [phone])

## 2011-07-26 NOTE — Progress Notes (Signed)
Patient refused to have foley catheter inserted. Bladder scan done = 23 mL of urine scanned.

## 2011-07-26 NOTE — Progress Notes (Signed)
Name: Ethan Jordan MRN: [id number] DOB: 17-Feb-1956 LOS: 7    Background: 56 yo M on disability d/t history of substance abuse, hep C and etoh who p/w 2d of non bloody N/V/D. No fever/chills/night sweats but diffuse abdominal pain. Admitted to the ICU on 07/19/11. Diagnosed with acute pancreatitis, DKA, new onset DM, mild ARI with dehydration and pneumonia. Started on empiric abx.   Subjective: C/o back pain today    Microbiology: Blood Cx 4/3 >>>  Urine Cx 4/3 >>> NEG  BC x 2 4/5 (fever ) >  Results for orders placed during the hospital encounter of 07/19/11  CULTURE, BLOOD (ROUTINE X 2)     Status: Normal (Preliminary result)   Collection Time   07/19/11  4:30 PM      Component Value Range Status Comment   Specimen Description BLOOD ARM LEFT   Final    Special Requests BOTTLES DRAWN AEROBIC ONLY 5CC   Final    Culture  Setup Time [id number]   Final    Culture     Final    Value:        BLOOD CULTURE RECEIVED NO GROWTH TO DATE CULTURE WILL BE HELD FOR 5 DAYS BEFORE ISSUING A FINAL NEGATIVE REPORT   Report Status PENDING   Incomplete   CULTURE, BLOOD (ROUTINE X 2)     Status: Normal (Preliminary result)   Collection Time   07/19/11  4:45 PM      Component Value Range Status Comment   Specimen Description BLOOD HAND LEFT   Final    Special Requests BOTTLES DRAWN AEROBIC ONLY 5CC   Final    Culture  Setup Time [id number]   Final    Culture     Final    Value:        BLOOD CULTURE RECEIVED NO GROWTH TO DATE CULTURE WILL BE HELD FOR 5 DAYS BEFORE ISSUING A FINAL NEGATIVE REPORT   Report Status PENDING   Incomplete   MRSA PCR SCREENING     Status: Normal   Collection Time   07/19/11  8:28 PM      Component Value Range Status Comment   MRSA by PCR NEGATIVE  NEGATIVE  Final   URINE CULTURE     Status: Normal   Collection Time   07/19/11  8:29 PM      Component Value Range Status Comment   Specimen Description URINE, CATHETERIZED   Final    Special Requests NONE   Final    Culture  Setup Time [id number]   Final    Colony Count NO GROWTH   Final    Culture NO GROWTH   Final    Report Status 07/20/2011 FINAL   Final   CULTURE, BLOOD (ROUTINE X 2)     Status: Normal (Preliminary result)   Collection Time   07/19/11  8:55 PM      Component Value Range Status Comment   Specimen Description BLOOD LEFT ARM   Final    Special Requests BOTTLES DRAWN AEROBIC AND ANAEROBIC 10CC   Final    Culture  Setup Time [id number]   Final    Culture     Final    Value:        BLOOD CULTURE RECEIVED NO GROWTH TO DATE CULTURE WILL BE HELD FOR 5 DAYS BEFORE ISSUING A FINAL NEGATIVE REPORT   Report Status PENDING   Incomplete   CULTURE, BLOOD (ROUTINE X 2)     Status: Normal (Preliminary result)  Collection Time   07/19/11  9:00 PM      Component Value Range Status Comment   Specimen Description BLOOD RIGHT ARM   Final    Special Requests BOTTLES DRAWN AEROBIC AND ANAEROBIC 10CC   Final    Culture  Setup Time [id number]   Final    Culture     Final    Value:        BLOOD CULTURE RECEIVED NO GROWTH TO DATE CULTURE WILL BE HELD FOR 5 DAYS BEFORE ISSUING A FINAL NEGATIVE REPORT   Report Status PENDING   Incomplete   CULTURE, BLOOD (ROUTINE X 2)     Status: Normal (Preliminary result)   Collection Time   07/21/11  8:45 PM      Component Value Range Status Comment   Specimen Description BLOOD LEFT HAND   Final    Special Requests BOTTLES DRAWN AEROBIC ONLY 5CC   Final    Culture  Setup Time [id number]   Final    Culture     Final    Value:        BLOOD CULTURE RECEIVED NO GROWTH TO DATE CULTURE WILL BE HELD FOR 5 DAYS BEFORE ISSUING A FINAL NEGATIVE REPORT   Report Status PENDING   Incomplete   CULTURE, BLOOD (ROUTINE X 2)     Status: Normal (Preliminary result)   Collection Time   07/21/11  8:55 PM      Component Value Range Status Comment   Specimen Description BLOOD RIGHT ARM   Final    Special Requests BOTTLES DRAWN AEROBIC ONLY 10CC   Final    Culture  Setup Time  [id number]   Final    Culture     Final    Value:        BLOOD CULTURE RECEIVED NO GROWTH TO DATE CULTURE WILL BE HELD FOR 5 DAYS BEFORE ISSUING A FINAL NEGATIVE REPORT   Report Status PENDING   Incomplete     Lab 07/25/11 0630 07/19/11 2046  PROCALCITON 0.92 0.14     Antibiotics:  Cipro 4/3>>> 4/4  Flagyl 4/3>>> 4/4  Vanc (fever) 4/6 >> 4/9 Zosyn (fever ) 4/6 >> 4/9 Imipenem 4/10  Studies/Events: Abd/Pelvic CT 4/3>>> acute pancreatitis, cholelithiasis  Consults: GI (Dr. Elnoria Howard) - signed off PCCM primary team for 5 days [facility] 4/10  [facility] 4/10   Clinical Data: Respiratory failure  PORTABLE CHEST - 1 VIEW  Comparison: 07/22/2011  Findings:  Heart size is normal. There is no pleural effusion identified.  Bilateral lower lobe airspace opacities are again noted. Compared  with previous exam there is improved aeration to the right base.  IMPRESSION:  1. Interval improvement in aeration to the right base.  2. Persistent left base opacity.     Vital Signs: Filed Vitals:   07/25/11 1051 07/25/11 1404 07/25/11 2231 07/26/11 0512  BP: 138/78 120/68 101/62 120/73  Pulse: 118 115 105 106  Temp: 100.2 F (37.9 C) 98.4 F (36.9 C) 98.4 F (36.9 C) 99.5 F (37.5 C)  TempSrc: Oral Oral Oral Oral  Resp: 20 21 20 19   Height:   5\' 8"  (1.727 m)   Weight:   89.6 kg (197 lb 8.5 oz)   SpO2: 94% 93% 92% 94%    Intake/Output Summary (Last 24 hours) at 07/26/11 0846 Last data filed at 07/26/11 0650  Gross per 24 hour  Intake 1533.34 ml  Output    200 ml  Net 1333.34 ml   Physical Examination: Alert  and oriented x3 CVs: RRR, no murmurs RS: ctab , faint crackles at right base  Abdomen : tense, very mild tenderness in RUQ Significant thigh edema   Labs: Basic Metabolic Panel:  Lab 07/26/11 6578 07/25/11 1204 07/25/11 0630 07/23/11 0540  NA 143 147* -- --  K 3.4* 4.1 -- --  CL 114* 115* -- --  CO2 18* 21 -- --  GLUCOSE 407* 250* -- --  BUN 30* 26*  -- --  CREATININE 3.00* 2.61* -- --  CALCIUM 8.6 9.5 -- --  MG -- -- 2.3 1.8  PHOS -- -- 1.1* 0.9*   Liver Function Tests:  Lab 07/25/11 1204 07/23/11 0540  AST 84* 40*  ALT 38 24  ALKPHOS 119* 84  BILITOT 1.7* 3.9*  PROT 7.4 6.5  ALBUMIN 2.3* 2.2*    Lab 07/23/11 0540 07/19/11 2053  LIPASE 2040* 2187*  AMYLASE 412* 1038*   CBC:  Lab 07/26/11 0630 07/25/11 0630 07/19/11 2053 07/19/11 1552  WBC 19.3* 18.2* -- --  NEUTROABS -- -- 14.0* 12.7*  HGB 8.9* 10.2* -- --  HCT 26.5* 30.1* -- --  MCV 97.1 96.5 -- --  PLT 180 136* -- --   CBG:  Lab 07/26/11 0756 07/25/11 2227 07/25/11 1636 07/25/11 1454 07/25/11 1351 07/25/11 1242  GLUCAP 430* 484* 285* 216* 256* 201*   Hemoglobin A1C:  Lab 07/21/11 0217  HGBA1C 10.7*   Fasting Lipid Panel:  Lab 07/20/11 1630  CHOL 107  HDL 35*  LDLCALC 39  TRIG 469*  CHOLHDL 3.1  LDLDIRECT --   Results for Ethan Jordan, Ethan Jordan (MRN [id number]) as of 07/26/2011 08:48  Ref. Range 07/25/2011 15:41  Color, Urine Latest Range: YELLOW  YELLOW  APPearance Latest Range: CLEAR  CLOUDY (A)  Specific Gravity, Urine Latest Range: 1.005-1.030  1.018  pH Latest Range: 5.0-8.0  5.5  Glucose, UA Latest Range: NEGATIVE mg/dL 244 (A)  Bilirubin Urine Latest Range: NEGATIVE  NEGATIVE  Ketones, ur Latest Range: NEGATIVE mg/dL NEGATIVE  Protein Latest Range: NEGATIVE mg/dL 010 (A)  Urobilinogen, UA Latest Range: 0.0-1.0 mg/dL 0.2  Nitrite Latest Range: NEGATIVE  NEGATIVE  Leukocytes, UA Latest Range: NEGATIVE  SMALL (A)  Hgb urine dipstick Latest Range: NEGATIVE  MODERATE (A)  Urine-Other No range found RARE YEAST  WBC, UA Latest Range: <3 WBC/hpf 3-6  RBC / HPF Latest Range: <3 RBC/hpf 3-6  Squamous Epithelial / LPF Latest Range: RARE  RARE  Bacteria, UA Latest Range: RARE  RARE     Anemia Panel:  Lab 07/24/11 0535  VITAMINB12 691  FOLATE 18.2  FERRITIN 2857*  TIBC NOT CALC  IRON 159*  RETICCTPCT 0.6   HOSPITAL MEDICATIONS:     .  antiseptic oral rinse  15 mL Mouth Rinse q12n4p  . bd getting started take home kit  1 kit Other Once  . chlorhexidine  15 mL Mouth Rinse BID  . Fluticasone-Salmeterol  1 puff Inhalation BID  . imipenem-cilastatin  500 mg Intravenous Q12H  . insulin aspart  20 Units Subcutaneous Once  . insulin glargine  10 Units Subcutaneous Once  . insulin regular  0-10 Units Intravenous TID WC  . pantoprazole  40 mg Oral Q1200  . phosphorus  250 mg Oral TID  . DISCONTD: insulin aspart  0-20 Units Subcutaneous Q4H  . DISCONTD: insulin aspart  0-5 Units Subcutaneous QHS  . DISCONTD: insulin aspart  0-9 Units Subcutaneous TID WC  . DISCONTD: insulin aspart  5 Units Subcutaneous TID WC  . DISCONTD:  insulin glargine  10 Units Subcutaneous BID  . DISCONTD: insulin glargine  10 Units Subcutaneous BID  . DISCONTD: insulin glargine  20 Units Subcutaneous BID  . DISCONTD: insulin glargine  25 Units Subcutaneous QHS  . DISCONTD: insulin regular  0-10 Units Intravenous TID WC  . DISCONTD: piperacillin-tazobactam (ZOSYN)  IV  3.375 g Intravenous Q8H  . DISCONTD: vancomycin  1,000 mg Intravenous Q8H    Principal Problem:  *DKA (diabetic ketoacidoses) Active Problems:  Acute respiratory failure  Hypoxemia  Pulmonary edema  Acute renal failure  Tachycardia  HTN (hypertension)  Pancreatitis, acute  Hyperosmolality and hypernatremia  Hypophosphatemia    Assessment and Plan:  #DKA: New onset, HbA1c 10.7; Was on iv insulin  after admission - then transitioned to [place] Lantus and Novolog. 07/25/11 with acidosis again CBGs running high, developing ARF - back on iv insulin and iv fluids - improved - then back into DKA 07/26/11 - back on iv insulin.    #Fever: on /off since admission ? Blood Cultures, urine culture no growth  Due to pneumonia vs pancreatitis. Had abx for 7 days total - stopped on 4/9 - Vanc level high on 4/9 so patient will be on vanc for a while.  Remains febrile with rising WBCs on 07/26/11 - re  cultured , repeat CT abdomen started imipenem     #Acute Pancreatitis: c/o N/V at admission; CT reveals cholelithiasis, and GI was consulted, suggested elective gen surg consult for cholecystectomy (no role for ERCP in setting of normal LFTs; TG not impressive for pancreatitis.) Appreciate GI input.  P: back to NPO Repeat Ct abdomen and pelvis  CCs consult   #h/o COPD: No acute issue; increased WOB on 4/6, possibly d/t asp PNA; unlikely volume overload given normal renal and heart function, likely net in due to volume depletion PTA  P: cont advair, atrovent, albuterol    #Acute renal insufficiency: present on admisson.  Resolved with IVF on4/6; likely prerenal etiology; Cr trending down with IVF & mgmt of ketones (normal renal fxn at baseline)  07/25/11 back with ARF - decrease UO - ? Side effects from abx - resume iv fluids - worse 07/26/11 - place foley, asked nephrology for consult.   UA from 4/9 with fairly active sediment, CT abdomen and pelvis should bee a good one to tell us about hydronephrosis.      Navika Hoopes 07/26/2011, 8:46 AM

## 2011-07-26 NOTE — Telephone Encounter (Signed)
Tresa Endo, [place] paged to call Dr. Lavera Guise @ [phone]

## 2011-07-26 NOTE — Consult Note (Addendum)
Ethan Jordan        RENAL CONSULT   Reason for Consult: Worsening renal function Referring Physician: Dr. Valentino Nose [name] is a 56 y.o. black man admitted 18 July 2011 with nausea vomiting and marked elevation of glucose, amylase and lipase. He has gallstones and has been seen by surgery. CR was 0.72 8 April. However CR was 2.1 he yesterday and 3.0 today. Renal consult was requested by Dr. Lavera Guise   Past Medical History  Diagnosis Date  . Renal disorder   . Hepatitis C   . COPD (chronic obstructive pulmonary disease)     On inhaled steroids  . Diabetes mellitus    he has used crack cocaine both smoking and injectable-he says he has not used any since 1999 Right hip surgery "about 2 years agoRhabdomyolysis with ARF  History reviewed. No pertinent family history.  Social History: Born in [place], grew up in [place], finished ninth grade, Lives with 23 year old daughter, never married. 30 pack year history cigarette smoking. He says he stopped drinking after his hip surgery.  Used to smoke and shoot up crack--"none since 1999" Allergies: No Known Allergies    Results for orders placed during the hospital encounter of 07/19/11 (from the past 48 hour(s))  GLUCOSE, CAPILLARY     Status: Abnormal   Collection Time   07/24/11  4:20 PM      Component Value Range Comment   Glucose-Capillary 263 (*) 70 - 99 (mg/dL)   GLUCOSE, CAPILLARY     Status: Abnormal   Collection Time   07/24/11  8:49 PM      Component Value Range Comment   Glucose-Capillary 166 (*) 70 - 99 (mg/dL)   GLUCOSE, CAPILLARY     Status: Abnormal   Collection Time   07/25/11  1:41 AM      Component Value Range Comment   Glucose-Capillary 361 (*) 70 - 99 (mg/dL)   GLUCOSE, CAPILLARY     Status: Abnormal   Collection Time   07/25/11  4:31 AM      Component Value Range Comment   Glucose-Capillary 437 (*) 70 - 99 (mg/dL)   CBC     Status: Abnormal   Collection Time   07/25/11  6:30 AM      Component Value  Range Comment   WBC 18.2 (*) 4.0 - 10.5 (K/uL)    RBC 3.12 (*) 4.22 - 5.81 (MIL/uL)    Hemoglobin 10.2 (*) 13.0 - 17.0 (g/dL)    HCT 16.1 (*) 09.6 - 52.0 (%)    MCV 96.5  78.0 - 100.0 (fL)    MCH 32.7  26.0 - 34.0 (pg)    MCHC 33.9  30.0 - 36.0 (g/dL)    RDW 04.5  40.9 - 81.1 (%)    Platelets 136 (*) 150 - 400 (K/uL) REPEATED TO VERIFY  BASIC METABOLIC PANEL     Status: Abnormal   Collection Time   07/25/11  6:30 AM      Component Value Range Comment   Sodium 140  135 - 145 (mEq/L)    Potassium 3.6  3.5 - 5.1 (mEq/L)    Chloride 111  96 - 112 (mEq/L)    CO2 18 (*) 19 - 32 (mEq/L)    Glucose, Bld 402 (*) 70 - 99 (mg/dL)    BUN 24 (*) 6 - 23 (mg/dL)    Creatinine, Ser 9.14 (*) 0.50 - 1.35 (mg/dL)    Calcium 8.8  8.4 - 10.5 (mg/dL)  GFR calc non Af Amer 33 (*) >90 (mL/min)    GFR calc Af Amer 38 (*) >90 (mL/min)   MAGNESIUM     Status: Normal   Collection Time   07/25/11  6:30 AM      Component Value Range Comment   Magnesium 2.3  1.5 - 2.5 (mg/dL)   PHOSPHORUS     Status: Abnormal   Collection Time   07/25/11  6:30 AM      Component Value Range Comment   Phosphorus 1.1 (*) 2.3 - 4.6 (mg/dL)   VANCOMYCIN, TROUGH     Status: Abnormal   Collection Time   07/25/11  6:30 AM      Component Value Range Comment   Vancomycin Tr 35.2 (*) 10.0 - 20.0 (ug/mL)   LACTIC ACID, PLASMA     Status: Normal   Collection Time   07/25/11  6:30 AM      Component Value Range Comment   Lactic Acid, Venous 1.8  0.5 - 2.2 (mmol/L)   PROCALCITONIN     Status: Normal   Collection Time   07/25/11  6:30 AM      Component Value Range Comment   Procalcitonin 0.92     GLUCOSE, CAPILLARY     Status: Abnormal   Collection Time   07/25/11  6:40 AM      Component Value Range Comment   Glucose-Capillary 376 (*) 70 - 99 (mg/dL)   GLUCOSE, CAPILLARY     Status: Abnormal   Collection Time   07/25/11  8:14 AM      Component Value Range Comment   Glucose-Capillary 344 (*) 70 - 99 (mg/dL)   GLUCOSE, CAPILLARY      Status: Abnormal   Collection Time   07/25/11 11:13 AM      Component Value Range Comment   Glucose-Capillary 250 (*) 70 - 99 (mg/dL)   BASIC METABOLIC PANEL     Status: Abnormal   Collection Time   07/25/11 12:04 PM      Component Value Range Comment   Sodium 147 (*) 135 - 145 (mEq/L)    Potassium 4.1  3.5 - 5.1 (mEq/L)    Chloride 115 (*) 96 - 112 (mEq/L)    CO2 21  19 - 32 (mEq/L)    Glucose, Bld 250 (*) 70 - 99 (mg/dL)    BUN 26 (*) 6 - 23 (mg/dL)    Creatinine, Ser 4.09 (*) 0.50 - 1.35 (mg/dL)    Calcium 9.5  8.4 - 10.5 (mg/dL)    GFR calc non Af Amer 26 (*) >90 (mL/min)    GFR calc Af Amer 30 (*) >90 (mL/min)   HEPATIC FUNCTION PANEL     Status: Abnormal   Collection Time   07/25/11 12:04 PM      Component Value Range Comment   Total Protein 7.4  6.0 - 8.3 (g/dL)    Albumin 2.3 (*) 3.5 - 5.2 (g/dL)    AST 84 (*) 0 - 37 (U/L)    ALT 38  0 - 53 (U/L)    Alkaline Phosphatase 119 (*) 39 - 117 (U/L)    Total Bilirubin 1.7 (*) 0.3 - 1.2 (mg/dL)    Bilirubin, Direct 0.7 (*) 0.0 - 0.3 (mg/dL)    Indirect Bilirubin 1.0 (*) 0.3 - 0.9 (mg/dL)   GLUCOSE, CAPILLARY     Status: Abnormal   Collection Time   07/25/11 12:42 PM      Component Value Range Comment   Glucose-Capillary 201 (*)  70 - 99 (mg/dL)   GLUCOSE, CAPILLARY     Status: Abnormal   Collection Time   07/25/11  1:51 PM      Component Value Range Comment   Glucose-Capillary 256 (*) 70 - 99 (mg/dL)   GLUCOSE, CAPILLARY     Status: Abnormal   Collection Time   07/25/11  2:54 PM      Component Value Range Comment   Glucose-Capillary 216 (*) 70 - 99 (mg/dL)   URINALYSIS, ROUTINE W REFLEX MICROSCOPIC     Status: Abnormal   Collection Time   07/25/11  3:41 PM      Component Value Range Comment   Color, Urine YELLOW  YELLOW     APPearance CLOUDY (*) CLEAR     Specific Gravity, Urine 1.018  1.005 - 1.030     pH 5.5  5.0 - 8.0     Glucose, UA 100 (*) NEGATIVE (mg/dL)    Hgb urine dipstick MODERATE (*) NEGATIVE     Bilirubin Urine  NEGATIVE  NEGATIVE     Ketones, ur NEGATIVE  NEGATIVE (mg/dL)    Protein, ur 782 (*) NEGATIVE (mg/dL)    Urobilinogen, UA 0.2  0.0 - 1.0 (mg/dL)    Nitrite NEGATIVE  NEGATIVE     Leukocytes, UA SMALL (*) NEGATIVE    URINE MICROSCOPIC-ADD ON     Status: Normal   Collection Time   07/25/11  3:41 PM      Component Value Range Comment   Squamous Epithelial / LPF RARE  RARE     WBC, UA 3-6  <3 (WBC/hpf)    RBC / HPF 3-6  <3 (RBC/hpf)    Bacteria, UA RARE  RARE     Urine-Other RARE YEAST     GLUCOSE, CAPILLARY     Status: Abnormal   Collection Time   07/25/11  4:36 PM      Component Value Range Comment   Glucose-Capillary 285 (*) 70 - 99 (mg/dL)   GLUCOSE, CAPILLARY     Status: Abnormal   Collection Time   07/25/11 10:27 PM      Component Value Range Comment   Glucose-Capillary 484 (*) 70 - 99 (mg/dL)    Comment 1 Documented in Chart      Comment 2 Notify RN     BASIC METABOLIC PANEL     Status: Abnormal   Collection Time   07/26/11  6:30 AM      Component Value Range Comment   Sodium 143  135 - 145 (mEq/L)    Potassium 3.4 (*) 3.5 - 5.1 (mEq/L)    Chloride 114 (*) 96 - 112 (mEq/L)    CO2 18 (*) 19 - 32 (mEq/L)    Glucose, Bld 407 (*) 70 - 99 (mg/dL)    BUN 30 (*) 6 - 23 (mg/dL)    Creatinine, Ser 9.56 (*) 0.50 - 1.35 (mg/dL)    Calcium 8.6  8.4 - 10.5 (mg/dL)    GFR calc non Af Amer 22 (*) >90 (mL/min)    GFR calc Af Amer 26 (*) >90 (mL/min)   CBC     Status: Abnormal   Collection Time   07/26/11  6:30 AM      Component Value Range Comment   WBC 19.3 (*) 4.0 - 10.5 (K/uL)    RBC 2.73 (*) 4.22 - 5.81 (MIL/uL)    Hemoglobin 8.9 (*) 13.0 - 17.0 (g/dL)    HCT 21.3 (*) 08.6 - 52.0 (%)    MCV  97.1  78.0 - 100.0 (fL)    MCH 32.6  26.0 - 34.0 (pg)    MCHC 33.6  30.0 - 36.0 (g/dL)    RDW 45.4  09.8 - 11.9 (%)    Platelets 180  150 - 400 (K/uL)   VANCOMYCIN, RANDOM     Status: Normal   Collection Time   07/26/11  6:30 AM      Component Value Range Comment   Vancomycin Rm 18.5       GLUCOSE, CAPILLARY     Status: Abnormal   Collection Time   07/26/11  7:56 AM      Component Value Range Comment   Glucose-Capillary 430 (*) 70 - 99 (mg/dL)   GLUCOSE, CAPILLARY     Status: Abnormal   Collection Time   07/26/11 10:14 AM      Component Value Range Comment   Glucose-Capillary 458 (*) 70 - 99 (mg/dL)   GLUCOSE, CAPILLARY     Status: Abnormal   Collection Time   07/26/11 11:21 AM      Component Value Range Comment   Glucose-Capillary 465 (*) 70 - 99 (mg/dL)   GLUCOSE, CAPILLARY     Status: Abnormal   Collection Time   07/26/11 12:35 PM      Component Value Range Comment   Glucose-Capillary 384 (*) 70 - 99 (mg/dL)   BASIC METABOLIC PANEL     Status: Abnormal   Collection Time   07/26/11  1:20 PM      Component Value Range Comment   Sodium 139  135 - 145 (mEq/L)    Potassium 3.0 (*) 3.5 - 5.1 (mEq/L)    Chloride 109  96 - 112 (mEq/L)    CO2 20  19 - 32 (mEq/L)    Glucose, Bld 342 (*) 70 - 99 (mg/dL)    BUN 30 (*) 6 - 23 (mg/dL)    Creatinine, Ser 1.47 (*) 0.50 - 1.35 (mg/dL)    Calcium 9.0  8.4 - 10.5 (mg/dL)    GFR calc non Af Amer 23 (*) >90 (mL/min)    GFR calc Af Amer 27 (*) >90 (mL/min)   LIPASE, BLOOD     Status: Abnormal   Collection Time   07/26/11  1:20 PM      Component Value Range Comment   Lipase 857 (*) 11 - 59 (U/L)   HEPATIC FUNCTION PANEL     Status: Abnormal   Collection Time   07/26/11  1:20 PM      Component Value Range Comment   Total Protein 7.4  6.0 - 8.3 (g/dL)    Albumin 2.3 (*) 3.5 - 5.2 (g/dL)    AST 57 (*) 0 - 37 (U/L)    ALT 33  0 - 53 (U/L)    Alkaline Phosphatase 167 (*) 39 - 117 (U/L)    Total Bilirubin 1.2  0.3 - 1.2 (mg/dL)    Bilirubin, Direct 0.6 (*) 0.0 - 0.3 (mg/dL)    Indirect Bilirubin 0.6  0.3 - 0.9 (mg/dL)   GLUCOSE, CAPILLARY     Status: Abnormal   Collection Time   07/26/11  1:44 PM      Component Value Range Comment   Glucose-Capillary 285 (*) 70 - 99 (mg/dL)     Ct Abdomen Pelvis Wo Contrast  07/26/2011   *RADIOLOGY REPORT*  Clinical Data: History hepatitis C.  Abdominal pain.  Acute renal failure.  Pancreatitis.  CT ABDOMEN AND PELVIS WITHOUT CONTRAST  Technique:  Multidetector CT imaging  of the abdomen and pelvis was performed following the standard protocol without intravenous contrast.  Comparison: CT scan 07/20/2011  Findings: There is nodular air space disease in the right lower lobe suggesting pneumonia or aspiration pneumonitis.  No pericardial fluid.  Non-IV contrast images demonstrate no focal hepatic lesion.  There are small gallstones within the gallbladder.  No gallbladder distention.  No evidence of biliary ductal dilatation.  There is interval improvement in the peripancreatic inflammation seen on most recent comparison exam.  The pancreas remains mildly edematous through the head.  There are no organized fluid collections.  The spleen and adrenal glands are normal.  The kidneys are slightly enlarged compared to the exam on 07/20/2011.  No evidence of hydronephrosis.  There is a nonobstructing 1 mm calculus in the mid left kidney.  There is no evidence of ureterolithiasis or obstructive uropathy.  No distal ureteral stones or bladder stones.  The bladder is mildly distended with calculated volume of 180 ml.  The stomach, small bowel, appendix, and cecum are normal.  The colon and rectosigmoid colon are normal.  Abdominal aorta normal caliber.  No retroperitoneal periportal lymphadenopathy.  No free fluid the pelvis.  The prostate gland is normal.  No pelvic lymphadenopathy.  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  No acute osseous findings.  IMPRESSION:  1.  Right lower lobe pneumonia versus aspiration pneumonitis. 2.  No evidence of obstructive uropathy of the left or right kidney.  The kidneys are enlarged slightly compared to exam 6 days prior. 3.  Nonobstructing tiny calculus within the left kidney. 4.  Bladder is minimally distended. 5.  Improvement in the pancreatic inflammation.   Original Report Authenticated By: Genevive Bi, M.D.   Dg Chest 2 View  07/25/2011  *RADIOLOGY REPORT*  Clinical Data: Follow-up pneumonia  CHEST - 2 VIEW  Comparison: Chest radiograph 07/23/2011, and multiple priors dating back to 01/03/2010  Findings: Heart size is within normal limits.  Mediastinal hilar contours are stable the trachea is midline.  There is slight pulmonary vascular congestion. Faint, patchy airspace opacities in the lung bases, left greater than right, are similar to the chest radiograph of 07/23/2011.  On the lateral view, a very small unilateral pleural effusion is seen. The bony thorax is unremarkable.  IMPRESSION:  1.  Faint patchy bibasilar airspace opacities, left greater than right without significant change. 2.  Very small unilateral pleural effusion, seen on the lateral view.  Original Report Authenticated By: Britta Mccreedy, M.D.    ROS- no angina, no claudication, no melena, no hematochezia, no gross hematuria, no renal colic, no decreased force of urinary stream, no nocturia, no doe, no orthopnea, no purulent sputum, no hemoptysis, no weight gain or weight loss, no cold or heat intolerance. Blood pressure 109/62, pulse 72, temperature 97.9 F (36.6 C), temperature source Oral, resp. rate 20, height 5\' 8"  (1.727 m), weight 89.6 kg (197 lb 8.5 oz), SpO2 96.00%. Chest-clear Heart-no rub heard  Abdomen-tender in epigastrium GU-circumcised penis testes descended bilaterally (atrophic) Extremities-no arthritis, no rash, trace pretibial edema, no atheroembolic changes on toes   Assessment/Plan:  Acute kidney injury ? Etiology. CT scan done earlier today does not show hydronephrosis. I discussed it with Dr. Ty Hilts The bladder is somewhat distended with 200-250 cc of urine. We'll check a bladder scan. He pulled his Foley was inflated balloon while in ICU-May need reinsertion of Foley if bladder scan shows high residual volume area Hypercalcemia was present on admission-  we'll check SPEP,  UPEP, PTH. Phosphorus was 1.1 yesterday. Low phosphorus can cause rhabdomyolysis which may lead to acute renal failure. We'll check phosphorus and total CK He does not appear volume depleted. We'll give Lasix 100 mg IV x1. There are no eosinophils on peripheral blood smear-will check CBC with differential tomorrow. This is a puzzling case. I am not certain of the etiology of the declining renal function this point.    Zada Girt 07/26/2011, 2:33 PM

## 2011-07-26 NOTE — Consult Note (Signed)
Lipase still 600 range today and he is requiring insulin infusion.  Await further resolution of pancreatitis and improvement in glycemic control prior to surgery. I spoke to the patient and his family. Patient examined and I agree with the assessment and plan  Violeta Gelinas, MD, MPH, FACS Pager: [phone]  07/26/2011 5:09 PM

## 2011-07-26 NOTE — Plan of Care (Signed)
Problem: Food- and Nutrition-Related Knowledge Deficit (NB-1.1) Goal: Nutrition education Formal process to instruct or train a patient/client in a skill or to impart knowledge to help patients/clients voluntarily manage or modify food choices and eating behavior to maintain or improve health.  Outcome: Completed/Met Date Met:  07/26/11 RD completed new DM education with pt this morning. Dietary recall reveals several servings of juice, fairly balanced meals, and occasional skipped meals. Reviewed CHO Counting information and provided handout for pt.    Lab Results  Component Value Date    HGBA1C 10.7* 07/21/2011   Currently NPO but on Dysphagia 2 diet. Per last SLP note, pt tolerating diet well, refuses to wear dentures when eating. Eating 100% when on diet. Denies weight changes.  Re-consult RD for any additional nutrition needs.  Adair Laundry Pager# [phone]

## 2011-07-26 NOTE — Progress Notes (Signed)
Clinical Social Work  CSW received referral for transportation. CSW attempted to meet with patient but procedure being completed. CSW will follow up at a later time.  [place], [place] [phone]

## 2011-07-26 NOTE — Consult Note (Signed)
Ethan Jordan January 22, 1956  [id number].    Requesting MD: Dr. Lavera Guise Chief Complaint/Reason for Consult: pancreatitis and gallstones  HPI: 56 yo male with hx of hepatitis C, remote alcohol abuse and COPD who presented with acute abdominal pain, nausea, emesis, diarrhea on 07/19/11, found to have DKA new onset diabetes, acute pancreatitis, acute kidney injury. CT showed cholelithiasis and acute pancreatitis, though no stones seen obstructing. GI consulted near admission, felt no acute procedural interventions required as LFTs were wnl and no evidence of obstruction. Now there is elevated T Bili noted 4/7 at 3.9 and down-trending (normal on admission-no LFTs checked in between) and lipase remains elevated at 2040.  Surgery is consulted to evaluate for possible cholecystectomy.  Required intensive care for several days while DKA improving, now has return of hyperglycemia.   ROS: Patient is a very poor historian and unable to give time frame for symptoms, but he endorses intermittent abdominal pain. Had BM today. Denies nausea, emesis, diarrhea, rash.   History reviewed. No pertinent family history.  Past Medical History  Diagnosis Date  . Renal disorder   . Hepatitis C   . COPD (chronic obstructive pulmonary disease)     On inhaled steroids  . Diabetes mellitus     Past Surgical History  Procedure Date  . Total hip arthroplasty     right    Social History:  reports that he has been smoking.  He does not have any smokeless tobacco history on file. He states that he did have heavy alcohol use, required 8 month rehab in [facility], but lives with daughter now and she does not allow alcohol in house therefore he has been abstinent. Does not endorse other active drug use.  Allergies: No Known Allergies     . antiseptic oral rinse  15 mL Mouth Rinse q12n4p  . bd getting started take home kit  1 kit Other Once  . chlorhexidine  15 mL Mouth Rinse BID  . Fluticasone-Salmeterol  1 puff  Inhalation BID  . imipenem-cilastatin  500 mg Intravenous Q12H  . insulin aspart  20 Units Subcutaneous Once  . insulin glargine  10 Units Subcutaneous Once  . insulin regular  0-10 Units Intravenous TID WC  . pantoprazole  40 mg Oral Q1200  . phosphorus  250 mg Oral TID  . DISCONTD: insulin aspart  0-5 Units Subcutaneous QHS  . DISCONTD: insulin aspart  0-9 Units Subcutaneous TID WC  . DISCONTD: insulin glargine  10 Units Subcutaneous BID  . DISCONTD: insulin glargine  10 Units Subcutaneous BID  . DISCONTD: insulin glargine  20 Units Subcutaneous BID  . DISCONTD: insulin regular  0-10 Units Intravenous TID WC    Blood pressure 109/62, pulse 72, temperature 97.9 F (36.6 C), temperature source Oral, resp. rate 20, height 5\' 8"  (1.727 m), weight 197 lb 8.5 oz (89.6 kg), SpO2 96.00%. Physical Exam: Gen: NAD, lying in bed head elevated. CV: RRR Pulm: nonlabored Abd: moderate distention; BS diminished, mild TTP in peri-umubilical area, no RUQ tenderness. Small umbilical hernia noted. Ext: SCD in place.  Neuro: speech is mumbled. Alert and oriented x 3. No gross motor deficits.   Results for orders placed during the hospital encounter of 07/19/11 (from the past 48 hour(s))  GLUCOSE, CAPILLARY     Status: Abnormal   Collection Time   07/24/11 11:40 AM      Component Value Range Comment   Glucose-Capillary 191 (*) 70 - 99 (mg/dL)   GLUCOSE, CAPILLARY  Status: Abnormal   Collection Time   07/24/11  4:20 PM      Component Value Range Comment   Glucose-Capillary 263 (*) 70 - 99 (mg/dL)   GLUCOSE, CAPILLARY     Status: Abnormal   Collection Time   07/24/11  8:49 PM      Component Value Range Comment   Glucose-Capillary 166 (*) 70 - 99 (mg/dL)   GLUCOSE, CAPILLARY     Status: Abnormal   Collection Time   07/25/11  1:41 AM      Component Value Range Comment   Glucose-Capillary 361 (*) 70 - 99 (mg/dL)   GLUCOSE, CAPILLARY     Status: Abnormal   Collection Time   07/25/11  4:31 AM       Component Value Range Comment   Glucose-Capillary 437 (*) 70 - 99 (mg/dL)   CBC     Status: Abnormal   Collection Time   07/25/11  6:30 AM      Component Value Range Comment   WBC 18.2 (*) 4.0 - 10.5 (K/uL)    RBC 3.12 (*) 4.22 - 5.81 (MIL/uL)    Hemoglobin 10.2 (*) 13.0 - 17.0 (g/dL)    HCT 04.5 (*) 40.9 - 52.0 (%)    MCV 96.5  78.0 - 100.0 (fL)    MCH 32.7  26.0 - 34.0 (pg)    MCHC 33.9  30.0 - 36.0 (g/dL)    RDW 81.1  91.4 - 78.2 (%)    Platelets 136 (*) 150 - 400 (K/uL) REPEATED TO VERIFY  BASIC METABOLIC PANEL     Status: Abnormal   Collection Time   07/25/11  6:30 AM      Component Value Range Comment   Sodium 140  135 - 145 (mEq/L)    Potassium 3.6  3.5 - 5.1 (mEq/L)    Chloride 111  96 - 112 (mEq/L)    CO2 18 (*) 19 - 32 (mEq/L)    Glucose, Bld 402 (*) 70 - 99 (mg/dL)    BUN 24 (*) 6 - 23 (mg/dL)    Creatinine, Ser 9.56 (*) 0.50 - 1.35 (mg/dL)    Calcium 8.8  8.4 - 10.5 (mg/dL)    GFR calc non Af Amer 33 (*) >90 (mL/min)    GFR calc Af Amer 38 (*) >90 (mL/min)   MAGNESIUM     Status: Normal   Collection Time   07/25/11  6:30 AM      Component Value Range Comment   Magnesium 2.3  1.5 - 2.5 (mg/dL)   PHOSPHORUS     Status: Abnormal   Collection Time   07/25/11  6:30 AM      Component Value Range Comment   Phosphorus 1.1 (*) 2.3 - 4.6 (mg/dL)   VANCOMYCIN, TROUGH     Status: Abnormal   Collection Time   07/25/11  6:30 AM      Component Value Range Comment   Vancomycin Tr 35.2 (*) 10.0 - 20.0 (ug/mL)   LACTIC ACID, PLASMA     Status: Normal   Collection Time   07/25/11  6:30 AM      Component Value Range Comment   Lactic Acid, Venous 1.8  0.5 - 2.2 (mmol/L)   PROCALCITONIN     Status: Normal   Collection Time   07/25/11  6:30 AM      Component Value Range Comment   Procalcitonin 0.92     GLUCOSE, CAPILLARY     Status: Abnormal   Collection Time  07/25/11  6:40 AM      Component Value Range Comment   Glucose-Capillary 376 (*) 70 - 99 (mg/dL)   GLUCOSE, CAPILLARY      Status: Abnormal   Collection Time   07/25/11  8:14 AM      Component Value Range Comment   Glucose-Capillary 344 (*) 70 - 99 (mg/dL)   GLUCOSE, CAPILLARY     Status: Abnormal   Collection Time   07/25/11 11:13 AM      Component Value Range Comment   Glucose-Capillary 250 (*) 70 - 99 (mg/dL)   BASIC METABOLIC PANEL     Status: Abnormal   Collection Time   07/25/11 12:04 PM      Component Value Range Comment   Sodium 147 (*) 135 - 145 (mEq/L)    Potassium 4.1  3.5 - 5.1 (mEq/L)    Chloride 115 (*) 96 - 112 (mEq/L)    CO2 21  19 - 32 (mEq/L)    Glucose, Bld 250 (*) 70 - 99 (mg/dL)    BUN 26 (*) 6 - 23 (mg/dL)    Creatinine, Ser 0.98 (*) 0.50 - 1.35 (mg/dL)    Calcium 9.5  8.4 - 10.5 (mg/dL)    GFR calc non Af Amer 26 (*) >90 (mL/min)    GFR calc Af Amer 30 (*) >90 (mL/min)   HEPATIC FUNCTION PANEL     Status: Abnormal   Collection Time   07/25/11 12:04 PM      Component Value Range Comment   Total Protein 7.4  6.0 - 8.3 (g/dL)    Albumin 2.3 (*) 3.5 - 5.2 (g/dL)    AST 84 (*) 0 - 37 (U/L)    ALT 38  0 - 53 (U/L)    Alkaline Phosphatase 119 (*) 39 - 117 (U/L)    Total Bilirubin 1.7 (*) 0.3 - 1.2 (mg/dL)    Bilirubin, Direct 0.7 (*) 0.0 - 0.3 (mg/dL)    Indirect Bilirubin 1.0 (*) 0.3 - 0.9 (mg/dL)   GLUCOSE, CAPILLARY     Status: Abnormal   Collection Time   07/25/11 12:42 PM      Component Value Range Comment   Glucose-Capillary 201 (*) 70 - 99 (mg/dL)   GLUCOSE, CAPILLARY     Status: Abnormal   Collection Time   07/25/11  1:51 PM      Component Value Range Comment   Glucose-Capillary 256 (*) 70 - 99 (mg/dL)   GLUCOSE, CAPILLARY     Status: Abnormal   Collection Time   07/25/11  2:54 PM      Component Value Range Comment   Glucose-Capillary 216 (*) 70 - 99 (mg/dL)   URINALYSIS, ROUTINE W REFLEX MICROSCOPIC     Status: Abnormal   Collection Time   07/25/11  3:41 PM      Component Value Range Comment   Color, Urine YELLOW  YELLOW     APPearance CLOUDY (*) CLEAR     Specific  Gravity, Urine 1.018  1.005 - 1.030     pH 5.5  5.0 - 8.0     Glucose, UA 100 (*) NEGATIVE (mg/dL)    Hgb urine dipstick MODERATE (*) NEGATIVE     Bilirubin Urine NEGATIVE  NEGATIVE     Ketones, ur NEGATIVE  NEGATIVE (mg/dL)    Protein, ur 119 (*) NEGATIVE (mg/dL)    Urobilinogen, UA 0.2  0.0 - 1.0 (mg/dL)    Nitrite NEGATIVE  NEGATIVE     Leukocytes, UA SMALL (*)  NEGATIVE    URINE MICROSCOPIC-ADD ON     Status: Normal   Collection Time   07/25/11  3:41 PM      Component Value Range Comment   Squamous Epithelial / LPF RARE  RARE     WBC, UA 3-6  <3 (WBC/hpf)    RBC / HPF 3-6  <3 (RBC/hpf)    Bacteria, UA RARE  RARE     Urine-Other RARE YEAST     GLUCOSE, CAPILLARY     Status: Abnormal   Collection Time   07/25/11  4:36 PM      Component Value Range Comment   Glucose-Capillary 285 (*) 70 - 99 (mg/dL)   GLUCOSE, CAPILLARY     Status: Abnormal   Collection Time   07/25/11 10:27 PM      Component Value Range Comment   Glucose-Capillary 484 (*) 70 - 99 (mg/dL)    Comment 1 Documented in Chart      Comment 2 Notify RN     BASIC METABOLIC PANEL     Status: Abnormal   Collection Time   07/26/11  6:30 AM      Component Value Range Comment   Sodium 143  135 - 145 (mEq/L)    Potassium 3.4 (*) 3.5 - 5.1 (mEq/L)    Chloride 114 (*) 96 - 112 (mEq/L)    CO2 18 (*) 19 - 32 (mEq/L)    Glucose, Bld 407 (*) 70 - 99 (mg/dL)    BUN 30 (*) 6 - 23 (mg/dL)    Creatinine, Ser 4.09 (*) 0.50 - 1.35 (mg/dL)    Calcium 8.6  8.4 - 10.5 (mg/dL)    GFR calc non Af Amer 22 (*) >90 (mL/min)    GFR calc Af Amer 26 (*) >90 (mL/min)   CBC     Status: Abnormal   Collection Time   07/26/11  6:30 AM      Component Value Range Comment   WBC 19.3 (*) 4.0 - 10.5 (K/uL)    RBC 2.73 (*) 4.22 - 5.81 (MIL/uL)    Hemoglobin 8.9 (*) 13.0 - 17.0 (g/dL)    HCT 81.1 (*) 91.4 - 52.0 (%)    MCV 97.1  78.0 - 100.0 (fL)    MCH 32.6  26.0 - 34.0 (pg)    MCHC 33.6  30.0 - 36.0 (g/dL)    RDW 78.2  95.6 - 21.3 (%)    Platelets  180  150 - 400 (K/uL)   VANCOMYCIN, RANDOM     Status: Normal   Collection Time   07/26/11  6:30 AM      Component Value Range Comment   Vancomycin Rm 18.5     GLUCOSE, CAPILLARY     Status: Abnormal   Collection Time   07/26/11  7:56 AM      Component Value Range Comment   Glucose-Capillary 430 (*) 70 - 99 (mg/dL)   GLUCOSE, CAPILLARY     Status: Abnormal   Collection Time   07/26/11 10:14 AM      Component Value Range Comment   Glucose-Capillary 458 (*) 70 - 99 (mg/dL)    Dg Chest 2 View  0/11/6576  *RADIOLOGY REPORT*  Clinical Data: Follow-up pneumonia  CHEST - 2 VIEW  Comparison: Chest radiograph 07/23/2011, and multiple priors dating back to 01/03/2010  Findings: Heart size is within normal limits.  Mediastinal hilar contours are stable the trachea is midline.  There is slight pulmonary vascular congestion. Faint, patchy airspace opacities in the  lung bases, left greater than right, are similar to the chest radiograph of 07/23/2011.  On the lateral view, a very small unilateral pleural effusion is seen. The bony thorax is unremarkable.  IMPRESSION:  1.  Faint patchy bibasilar airspace opacities, left greater than right without significant change. 2.  Very small unilateral pleural effusion, seen on the lateral view.  Original Report Authenticated By: Britta Mccreedy, M.D.     Assessment/Plan  56 yo male with hx of hep C, remote alcohol abuse presents with acute pancreatitis, DKA, AKI and CT findings of cholelithiasis now with some evidence of obstruction.  1. Pancreatitis. Enzymes remain elevated with lipase >2000 and amylase 400. Noted transient increase in TB to 3.9 during course, perhaps passing stone now that improved. No obvious alternative etiology as pt denies recent alcohol use and TG only 166 on admission, gallstones are most likely cause. WBC now elevated, agree with CT abdomen/pelvis to rule out pseudocyst/necrosis.  Would continue to trend daily LFTs and lipase.   Patient will be  a candidate for cholecystectomy pending improvement in pancreatitis, DKA resolution.   2. Cholelithiasis. Cholecystectomy to be planned after resolution medical problems. 3. Diabetes mellitus with DKA. Restarted on insulin drip per primary service.  4. Acute kidney injury. Per primary service. 5. Hepatitis C.   Marieli Rudy PGY-2 07/26/2011, 11:19 AM

## 2011-07-27 LAB — DIFFERENTIAL
Basophils Absolute: 0.2 10*3/uL — ABNORMAL HIGH (ref 0.0–0.1)
Eosinophils Absolute: 0.3 10*3/uL (ref 0.0–0.7)
Lymphocytes Relative: 30 % (ref 12–46)
Monocytes Absolute: 1.9 10*3/uL — ABNORMAL HIGH (ref 0.1–1.0)
Monocytes Relative: 9 % (ref 3–12)
Neutro Abs: 12.8 10*3/uL — ABNORMAL HIGH (ref 1.7–7.7)

## 2011-07-27 LAB — GLUCOSE, CAPILLARY
Glucose-Capillary: 170 mg/dL — ABNORMAL HIGH (ref 70–99)
Glucose-Capillary: 239 mg/dL — ABNORMAL HIGH (ref 70–99)
Glucose-Capillary: 298 mg/dL — ABNORMAL HIGH (ref 70–99)
Glucose-Capillary: 377 mg/dL — ABNORMAL HIGH (ref 70–99)
Glucose-Capillary: 385 mg/dL — ABNORMAL HIGH (ref 70–99)

## 2011-07-27 LAB — BASIC METABOLIC PANEL
CO2: 18 mEq/L — ABNORMAL LOW (ref 19–32)
CO2: 21 mEq/L (ref 19–32)
Calcium: 9.1 mg/dL (ref 8.4–10.5)
Chloride: 107 mEq/L (ref 96–112)
Chloride: 108 mEq/L (ref 96–112)
Creatinine, Ser: 2.78 mg/dL — ABNORMAL HIGH (ref 0.50–1.35)
GFR calc Af Amer: 28 mL/min — ABNORMAL LOW (ref >90)
Glucose, Bld: 279 mg/dL — ABNORMAL HIGH (ref 70–99)
Potassium: 3.5 mEq/L (ref 3.5–5.1)
Potassium: 3.6 mEq/L (ref 3.5–5.1)
Sodium: 140 mEq/L (ref 135–145)
Sodium: 141 mEq/L (ref 135–145)

## 2011-07-27 LAB — COMPREHENSIVE METABOLIC PANEL
ALT: 29 U/L (ref 0–53)
AST: 48 U/L — ABNORMAL HIGH (ref 0–37)
Albumin: 2.1 g/dL — ABNORMAL LOW (ref 3.5–5.2)
Alkaline Phosphatase: 85 U/L (ref 39–117)
CO2: 19 mEq/L (ref 19–32)
Chloride: 109 mEq/L (ref 96–112)
GFR calc non Af Amer: 24 mL/min — ABNORMAL LOW (ref >90)
Potassium: 2.8 mEq/L — ABNORMAL LOW (ref 3.5–5.1)
Sodium: 141 mEq/L (ref 135–145)
Total Bilirubin: 1.3 mg/dL — ABNORMAL HIGH (ref 0.3–1.2)

## 2011-07-27 LAB — URINE CULTURE: Colony Count: NO GROWTH

## 2011-07-27 LAB — PATHOLOGIST SMEAR REVIEW

## 2011-07-27 LAB — CK: Total CK: 178 U/L (ref 7–232)

## 2011-07-27 MED ORDER — POTASSIUM CHLORIDE CRYS ER 20 MEQ PO TBCR
40.0000 meq | EXTENDED_RELEASE_TABLET | Freq: Two times a day (BID) | ORAL | Status: AC
  Administered 2011-07-27 – 2011-07-28 (×3): 40 meq via ORAL
  Filled 2011-07-27 (×4): qty 2

## 2011-07-27 MED ORDER — INSULIN ASPART 100 UNIT/ML ~~LOC~~ SOLN
0.0000 [IU] | SUBCUTANEOUS | Status: DC
  Administered 2011-07-27: 3 [IU] via SUBCUTANEOUS
  Administered 2011-07-27: 9 [IU] via SUBCUTANEOUS
  Administered 2011-07-27: 5 [IU] via SUBCUTANEOUS
  Administered 2011-07-27: 9 [IU] via SUBCUTANEOUS
  Administered 2011-07-28: 5 [IU] via SUBCUTANEOUS
  Administered 2011-07-28: 7 [IU] via SUBCUTANEOUS
  Administered 2011-07-28 – 2011-07-29 (×6): 3 [IU] via SUBCUTANEOUS
  Administered 2011-07-29: 7 [IU] via SUBCUTANEOUS
  Administered 2011-07-29 – 2011-07-30 (×2): 5 [IU] via SUBCUTANEOUS
  Administered 2011-07-30 (×2): 3 [IU] via SUBCUTANEOUS

## 2011-07-27 NOTE — Progress Notes (Signed)
Not ready yet Patient examined and I agree with the assessment and plan  Violeta Gelinas, MD, MPH, FACS Pager: [phone]  07/27/2011 4:45 PM

## 2011-07-27 NOTE — Progress Notes (Signed)
[facility]  Subjective:  Awake,alert, says abd pain is less   Objective: Vital signs in last 24 hours: Blood pressure 133/54, pulse 107, temperature 98 F (36.7 C), temperature source Oral, resp. rate 20, height 5\' 8"  (1.727 m), weight 89.6 kg (197 lb 8.5 oz), SpO2 96.00%.    PHYSICAL EXAM General--awake, alert Chest--clear Heart--no rub Abd--less epigastric tenderness Extr--trace edema  Lab Results:   Lab 07/27/11 0319 07/26/11 2206 07/26/11 2025 07/25/11 0630 07/23/11 0540  NA 141 143 141 -- --  K 2.8* 2.6* 2.8* -- --  CL 109 111 111 -- --  CO2 19 20 17* -- --  BUN 25* 27* 26* -- --  CREATININE 2.83* 2.86* 2.80* -- --  ALB -- -- -- -- --  GLUCOSE 224* -- -- -- --  CALCIUM 8.8 9.0 9.1 -- --  PHOS 3.6 -- -- 1.1* 0.9*     Basename 07/26/11 2206 07/26/11 0630  WBC 20.9* 19.3*  HGB 9.5* 8.9*  HCT 27.6* 26.5*  PLT 219 180   I/O yesterday 730/2376, so far today 350 cc out toda  Assessment/Plan: Acute Kidney Injury--? etiol  CK is not elevated, phosphorus is normal 3.6 SPEP and UPEP pending Bladder scan showed only 23 cc PVR yesterday   Will replace K and await studies.  Still not sure why Cr has gone up.  LOS: 8 days   Maher Shon F 07/27/2011,12:28 PM   .labalb

## 2011-07-27 NOTE — Progress Notes (Signed)
Patient ID: Ethan Jordan, male   DOB: Sep 25, 1955, 56 y.o.   MRN: [id number]    Subjective: Pt feels dizzy this morning.  Hasn't felt this way before.  Still having some abdominal pain.  No nausea  Objective: Vital signs in last 24 hours: Temp:  [97.9 F (36.6 C)-100.2 F (37.9 C)] 98.7 F (37.1 C) (04/11 0558) Pulse Rate:  [72-114] 106  (04/11 0558) Resp:  [18-20] 18  (04/11 0558) BP: (109-112)/(55-63) 112/55 mmHg (04/11 0558) SpO2:  [92 %-96 %] 95 % (04/11 0558) Weight:  [197 lb 8.5 oz (89.6 kg)] 197 lb 8.5 oz (89.6 kg) (04/10 2112) Last BM Date: 07/26/11  Intake/Output from previous day: 04/10 0701 - 04/11 0700 In: 730 [P.O.:730] Out: 2376 [Urine:2375; Stool:1] Intake/Output this shift:    PE: Abd: soft, tender throughout the upper abdomen, but more so in the LUQ.  Few BS. Mild bloating.  Lab Results:   Basename 07/26/11 2206 07/26/11 0630  WBC 20.9* 19.3*  HGB 9.5* 8.9*  HCT 27.6* 26.5*  PLT 219 180   BMET  Basename 07/27/11 0319 07/26/11 2206  NA 141 143  K 2.8* 2.6*  CL 109 111  CO2 19 20  GLUCOSE 224* 191*  BUN 25* 27*  CREATININE 2.83* 2.86*  CALCIUM 8.8 9.0   PT/INR No results found for this basename: LABPROT:2,INR:2 in the last 72 hours CMP     Component Value Date/Time   NA 141 07/27/2011 0319   K 2.8* 07/27/2011 0319   CL 109 07/27/2011 0319   CO2 19 07/27/2011 0319   GLUCOSE 224* 07/27/2011 0319   BUN 25* 07/27/2011 0319   CREATININE 2.83* 07/27/2011 0319   CALCIUM 8.8 07/27/2011 0319   PROT 7.1 07/27/2011 0319   ALBUMIN 2.1* 07/27/2011 0319   AST 48* 07/27/2011 0319   ALT 29 07/27/2011 0319   ALKPHOS 85 07/27/2011 0319   BILITOT 1.3* 07/27/2011 0319   GFRNONAA 24* 07/27/2011 0319   GFRAA 28* 07/27/2011 0319   Lipase     Component Value Date/Time   LIPASE 536* 07/27/2011 0319       Studies/Results: Ct Abdomen Pelvis Wo Contrast  07/26/2011  *RADIOLOGY REPORT*  Clinical Data: History hepatitis C.  Abdominal pain.  Acute renal failure.   Pancreatitis.  CT ABDOMEN AND PELVIS WITHOUT CONTRAST  Technique:  Multidetector CT imaging of the abdomen and pelvis was performed following the standard protocol without intravenous contrast.  Comparison: CT scan 07/20/2011  Findings: There is nodular air space disease in the right lower lobe suggesting pneumonia or aspiration pneumonitis.  No pericardial fluid.  Non-IV contrast images demonstrate no focal hepatic lesion.  There are small gallstones within the gallbladder.  No gallbladder distention.  No evidence of biliary ductal dilatation.  There is interval improvement in the peripancreatic inflammation seen on most recent comparison exam.  The pancreas remains mildly edematous through the head.  There are no organized fluid collections.  The spleen and adrenal glands are normal.  The kidneys are slightly enlarged compared to the exam on 07/20/2011.  No evidence of hydronephrosis.  There is a nonobstructing 1 mm calculus in the mid left kidney.  There is no evidence of ureterolithiasis or obstructive uropathy.  No distal ureteral stones or bladder stones.  The bladder is mildly distended with calculated volume of 180 ml.  The stomach, small bowel, appendix, and cecum are normal.  The colon and rectosigmoid colon are normal.  Abdominal aorta normal caliber.  No retroperitoneal periportal lymphadenopathy.  No  free fluid the pelvis.  The prostate gland is normal.  No pelvic lymphadenopathy.  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  No acute osseous findings.  IMPRESSION:  1.  Right lower lobe pneumonia versus aspiration pneumonitis. 2.  No evidence of obstructive uropathy of the left or right kidney.  The kidneys are enlarged slightly compared to exam 6 days prior. 3.  Nonobstructing tiny calculus within the left kidney. 4.  Bladder is minimally distended. 5.  Improvement in the pancreatic inflammation.  Original Report Authenticated By: Genevive Bi, M.D.   Dg Chest 2 View  07/25/2011   *RADIOLOGY REPORT*  Clinical Data: Follow-up pneumonia  CHEST - 2 VIEW  Comparison: Chest radiograph 07/23/2011, and multiple priors dating back to 01/03/2010  Findings: Heart size is within normal limits.  Mediastinal hilar contours are stable the trachea is midline.  There is slight pulmonary vascular congestion. Faint, patchy airspace opacities in the lung bases, left greater than right, are similar to the chest radiograph of 07/23/2011.  On the lateral view, a very small unilateral pleural effusion is seen. The bony thorax is unremarkable.  IMPRESSION:  1.  Faint patchy bibasilar airspace opacities, left greater than right without significant change. 2.  Very small unilateral pleural effusion, seen on the lateral view.  Original Report Authenticated By: Britta Mccreedy, M.D.    Anti-infectives: Anti-infectives     Start     Dose/Rate Route Frequency Ordered Stop   07/26/11 1000   imipenem-cilastatin (PRIMAXIN) 500 mg in sodium chloride 0.9 % 100 mL IVPB        500 mg 200 mL/hr over 30 Minutes Intravenous Every 12 hours 07/26/11 0807     07/22/11 1500   vancomycin (VANCOCIN) IVPB 1000 mg/200 mL premix  Status:  Discontinued        1,000 mg 200 mL/hr over 60 Minutes Intravenous Every 8 hours 07/22/11 1414 07/25/11 0848   07/22/11 1415   piperacillin-tazobactam (ZOSYN) IVPB 3.375 g  Status:  Discontinued        3.375 g 12.5 mL/hr over 240 Minutes Intravenous 3 times per day 07/22/11 1414 07/25/11 0848   07/19/11 2100   ciprofloxacin (CIPRO) IVPB 400 mg  Status:  Discontinued        400 mg 200 mL/hr over 60 Minutes Intravenous Every 24 hours 07/19/11 2046 07/20/11 1103   07/19/11 2015   metroNIDAZOLE (FLAGYL) IVPB 500 mg  Status:  Discontinued        500 mg 100 mL/hr over 60 Minutes Intravenous Every 8 hours 07/19/11 2004 07/20/11 1103   07/19/11 2015   ciprofloxacin (CIPRO) IVPB 400 mg  Status:  Discontinued        400 mg 200 mL/hr over 60 Minutes Intravenous Every 8 hours 07/19/11 2004  07/19/11 2046           Assessment/Plan  1. Gallstone pancreatitis 2. DKA  Plan: 1. Lipase still elevated in 500s and patient still having pain.  Would delay surgical intervention until pancreatitis has resolved to decrease his risk for surgical complications.   2. Cont to follow.   LOS: 8 days    Lewayne Pauley E 07/27/2011

## 2011-07-27 NOTE — Progress Notes (Signed)
Patient ID: Ethan Jordan  male  JYN:[id number]    DOB: Mar 13, 1956    DOA: 07/19/2011  PCP: No primary provider on file.  Subjective: Abdominal pain improving,  Objective: Weight change: 0 kg (0 lb)  Intake/Output Summary (Last 24 hours) at 07/27/11 1515 Last data filed at 07/27/11 1300  Gross per 24 hour  Intake    342 ml  Output   2926 ml  Net  -2584 ml   Blood pressure 101/48, pulse 70, temperature 97.8 F (36.6 C), temperature source Oral, resp. rate 20, height 5\' 8"  (1.727 m), weight 89.6 kg (197 lb 8.5 oz), SpO2 98.00%.  Physical Exam: General: Alert and awake, oriented x3, not in any acute distress. HEENT: anicteric sclera, pupils reactive to light and accommodation, EOMI CVS: S1-S2 clear, no murmur rubs or gallops Chest: clear to auscultation bilaterally, no wheezing, rales or rhonchi Abdomen: Mild tenderness in right upper quadrant, nondistended, normal bowel sounds, no organomegaly Extremities: no cyanosis, clubbing or edema noted bilaterally   Lab Results: Basic Metabolic Panel:  Lab 07/27/11 8657 07/27/11 0319 07/25/11 0630  NA 140 141 --  K 3.5 2.8* --  CL 107 109 --  CO2 18* 19 --  GLUCOSE 367* 224* --  BUN 27* 25* --  CREATININE 2.78* 2.83* --  CALCIUM 8.7 8.8 --  MG -- -- 2.3  PHOS -- 3.6 --   Liver Function Tests:  Lab 07/27/11 0319 07/26/11 1320  AST 48* 57*  ALT 29 33  ALKPHOS 85 167*  BILITOT 1.3* 1.2  PROT 7.1 7.4  ALBUMIN 2.1* 2.3*    Lab 07/27/11 0319 07/26/11 1320 07/23/11 0540  LIPASE 536* 857* --  AMYLASE -- -- 412*   No results found for this basename: AMMONIA:2 in the last 168 hours CBC:  Lab 07/27/11 0319 07/26/11 2206 07/26/11 0630  WBC -- 20.9* 19.3*  NEUTROABS 12.8* -- --  HGB -- 9.5* 8.9*  HCT -- 27.6* 26.5*  MCV -- 94.8 97.1  PLT -- 219 180   Cardiac Enzymes:  Lab 07/27/11 0319  CKTOTAL 178  CKMB --  CKMBINDEX --  TROPONINI --   BNP: No components found with this basename: POCBNP:2 CBG:  Lab 07/27/11  1153 07/27/11 0836 07/27/11 0034 07/26/11 2330 07/26/11 2218  GLUCAP 385* 377* 170* 165* 175*     Micro Results: Recent Results (from the past 240 hour(s))  CULTURE, BLOOD (ROUTINE X 2)     Status: Normal   Collection Time   07/19/11  4:30 PM      Component Value Range Status Comment   Specimen Description BLOOD ARM LEFT   Final    Special Requests BOTTLES DRAWN AEROBIC ONLY 5CC   Final    Culture  Setup Time [id number]   Final    Culture NO GROWTH 5 DAYS   Final    Report Status 07/26/2011 FINAL   Final   CULTURE, BLOOD (ROUTINE X 2)     Status: Normal   Collection Time   07/19/11  4:45 PM      Component Value Range Status Comment   Specimen Description BLOOD HAND LEFT   Final    Special Requests BOTTLES DRAWN AEROBIC ONLY 5CC   Final    Culture  Setup Time [id number]   Final    Culture NO GROWTH 5 DAYS   Final    Report Status 07/26/2011 FINAL   Final   MRSA PCR SCREENING     Status: Normal   Collection Time  07/19/11  8:28 PM      Component Value Range Status Comment   MRSA by PCR NEGATIVE  NEGATIVE  Final   URINE CULTURE     Status: Normal   Collection Time   07/19/11  8:29 PM      Component Value Range Status Comment   Specimen Description URINE, CATHETERIZED   Final    Special Requests NONE   Final    Culture  Setup Time [id number]   Final    Colony Count NO GROWTH   Final    Culture NO GROWTH   Final    Report Status 07/20/2011 FINAL   Final   CULTURE, BLOOD (ROUTINE X 2)     Status: Normal   Collection Time   07/19/11  8:55 PM      Component Value Range Status Comment   Specimen Description BLOOD LEFT ARM   Final    Special Requests BOTTLES DRAWN AEROBIC AND ANAEROBIC 10CC   Final    Culture  Setup Time [id number]   Final    Culture NO GROWTH 5 DAYS   Final    Report Status 07/26/2011 FINAL   Final   CULTURE, BLOOD (ROUTINE X 2)     Status: Normal   Collection Time   07/19/11  9:00 PM      Component Value Range Status Comment   Specimen Description BLOOD  RIGHT ARM   Final    Special Requests BOTTLES DRAWN AEROBIC AND ANAEROBIC 10CC   Final    Culture  Setup Time [id number]   Final    Culture NO GROWTH 5 DAYS   Final    Report Status 07/26/2011 FINAL   Final   CULTURE, BLOOD (ROUTINE X 2)     Status: Normal (Preliminary result)   Collection Time   07/21/11  8:45 PM      Component Value Range Status Comment   Specimen Description BLOOD LEFT HAND   Final    Special Requests BOTTLES DRAWN AEROBIC ONLY 5CC   Final    Culture  Setup Time [id number]   Final    Culture     Final    Value:        BLOOD CULTURE RECEIVED NO GROWTH TO DATE CULTURE WILL BE HELD FOR 5 DAYS BEFORE ISSUING A FINAL NEGATIVE REPORT   Report Status PENDING   Incomplete   CULTURE, BLOOD (ROUTINE X 2)     Status: Normal (Preliminary result)   Collection Time   07/21/11  8:55 PM      Component Value Range Status Comment   Specimen Description BLOOD RIGHT ARM   Final    Special Requests BOTTLES DRAWN AEROBIC ONLY 10CC   Final    Culture  Setup Time [id number]   Final    Culture     Final    Value:        BLOOD CULTURE RECEIVED NO GROWTH TO DATE CULTURE WILL BE HELD FOR 5 DAYS BEFORE ISSUING A FINAL NEGATIVE REPORT   Report Status PENDING   Incomplete   URINE CULTURE     Status: Normal   Collection Time   07/26/11 12:58 PM      Component Value Range Status Comment   Specimen Description URINE, CLEAN CATCH   Final    Special Requests NONE   Final    Culture  Setup Time [id number]   Final    Colony Count NO GROWTH   Final    Culture NO GROWTH  Final    Report Status 07/27/2011 FINAL   Final   CULTURE, BLOOD (ROUTINE X 2)     Status: Normal (Preliminary result)   Collection Time   07/26/11  1:10 PM      Component Value Range Status Comment   Specimen Description BLOOD LEFT ARM   Final    Special Requests BOTTLES DRAWN AEROBIC AND ANAEROBIC 10CC   Final    Culture  Setup Time [id number]   Final    Culture     Final    Value:        BLOOD CULTURE RECEIVED NO  GROWTH TO DATE CULTURE WILL BE HELD FOR 5 DAYS BEFORE ISSUING A FINAL NEGATIVE REPORT   Report Status PENDING   Incomplete   CULTURE, BLOOD (ROUTINE X 2)     Status: Normal (Preliminary result)   Collection Time   07/26/11  1:20 PM      Component Value Range Status Comment   Specimen Description BLOOD LEFT HAND   Final    Special Requests BOTTLES DRAWN AEROBIC ONLY 8CC   Final    Culture  Setup Time [id number]   Final    Culture     Final    Value:        BLOOD CULTURE RECEIVED NO GROWTH TO DATE CULTURE WILL BE HELD FOR 5 DAYS BEFORE ISSUING A FINAL NEGATIVE REPORT   Report Status PENDING   Incomplete     Studies/Results: Ct Abdomen Pelvis Wo Contrast  07/26/2011  *RADIOLOGY REPORT*  Clinical Data: History hepatitis C.  Abdominal pain.  Acute renal failure.  Pancreatitis.  CT ABDOMEN AND PELVIS WITHOUT CONTRAST  Technique:  Multidetector CT imaging of the abdomen and pelvis was performed following the standard protocol without intravenous contrast.  Comparison: CT scan 07/20/2011  Findings: There is nodular air space disease in the right lower lobe suggesting pneumonia or aspiration pneumonitis.  No pericardial fluid.  Non-IV contrast images demonstrate no focal hepatic lesion.  There are small gallstones within the gallbladder.  No gallbladder distention.  No evidence of biliary ductal dilatation.  There is interval improvement in the peripancreatic inflammation seen on most recent comparison exam.  The pancreas remains mildly edematous through the head.  There are no organized fluid collections.  The spleen and adrenal glands are normal.  The kidneys are slightly enlarged compared to the exam on 07/20/2011.  No evidence of hydronephrosis.  There is a nonobstructing 1 mm calculus in the mid left kidney.  There is no evidence of ureterolithiasis or obstructive uropathy.  No distal ureteral stones or bladder stones.  The bladder is mildly distended with calculated volume of 180 ml.  The stomach,  small bowel, appendix, and cecum are normal.  The colon and rectosigmoid colon are normal.  Abdominal aorta normal caliber.  No retroperitoneal periportal lymphadenopathy.  No free fluid the pelvis.  The prostate gland is normal.  No pelvic lymphadenopathy.  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  No acute osseous findings.  IMPRESSION:  1.  Right lower lobe pneumonia versus aspiration pneumonitis. 2.  No evidence of obstructive uropathy of the left or right kidney.  The kidneys are enlarged slightly compared to exam 6 days prior. 3.  Nonobstructing tiny calculus within the left kidney. 4.  Bladder is minimally distended. 5.  Improvement in the pancreatic inflammation.  Original Report Authenticated By: Genevive Bi, M.D.   Ct Abdomen Pelvis Wo Contrast  07/20/2011  *RADIOLOGY REPORT*  Clinical Data:  Diabetic ketoacidosis.  Abdominal pain.  Possible sepsis.  CT ABDOMEN AND PELVIS WITHOUT CONTRAST  Technique:  Multidetector CT imaging of the abdomen and pelvis was performed following the standard protocol without intravenous contrast.  Comparison: 04/10/2009.  Findings: The lung bases demonstrate a small effusions and bibasilar atelectasis.  The heart is borderline enlarged.  The unenhanced appearance of the liver is unremarkable.  No focal lesions or biliary dilatation.  Small gallstones are noted in the gallbladder.  No distention or inflammation.  The common bile duct is normal in caliber.  The pancreas is mildly enlarged and inflamed with peripancreatic inflammatory changes also involving the second portion of the duodenum and also of the mesocolon.  Findings consistent with acute pancreatitis.  The spleen is normal in size.  No focal lesions.  The adrenal glands and kidneys are normal.  No renal or obstructing ureteral calculi.  The stomach is unremarkable.  The third and fourth portion of the duodenum are normal.  The small bowel is unremarkable.  Dense material is noted the colon.  This is  likely ingested material such as Pepto-Bismol.  Mild fibrofatty infiltrative changes involving the colon may suggest a remote inflammatory bowel disease.  No active inflammation is demonstrated.  The appendix is normal. Dense material is noted in the appendix.  The aorta is normal in caliber.  Mild atherosclerotic changes.  No mesenteric or retroperitoneal masses or adenopathy.  There are small scattered lymph nodes typically in the periportal and celiac axis region.  There is a Foley catheter in the bladder.  The prostate gland and seminal vesicles appear normal.  There is artifact through the lower pelvis due to a right hip prosthesis.  No pelvic mass, adenopathy or free pelvic fluid collections.  No inguinal mass or hernia.  The bony structures are intact.  IMPRESSION:  1.  CT findings consistent with acute pancreatitis. 2.  Cholelithiasis. 3.  No other significant abdominal/pelvic findings. 4.  Small bilateral pleural effusions and bibasilar atelectasis.  Original Report Authenticated By: P. Loralie Champagne, M.D.   Dg Chest 2 View  07/25/2011  *RADIOLOGY REPORT*  Clinical Data: Follow-up pneumonia  CHEST - 2 VIEW  Comparison: Chest radiograph 07/23/2011, and multiple priors dating back to 01/03/2010  Findings: Heart size is within normal limits.  Mediastinal hilar contours are stable the trachea is midline.  There is slight pulmonary vascular congestion. Faint, patchy airspace opacities in the lung bases, left greater than right, are similar to the chest radiograph of 07/23/2011.  On the lateral view, a very small unilateral pleural effusion is seen. The bony thorax is unremarkable.  IMPRESSION:  1.  Faint patchy bibasilar airspace opacities, left greater than right without significant change. 2.  Very small unilateral pleural effusion, seen on the lateral view.  Original Report Authenticated By: Britta Mccreedy, M.D.   Ct Head Wo Contrast  07/19/2011  *RADIOLOGY REPORT*  Clinical Data: Altered mental status.   CT HEAD WITHOUT CONTRAST  Technique:  Contiguous axial images were obtained from the base of the skull through the vertex without contrast.  Comparison: Head CT 03/29/2007.  Findings: The ventricles are normal.  No extra-axial fluid collections are seen.  The brainstem and cerebellum are unremarkable.  No acute intracranial findings such as infarction or hemorrhage.  No mass lesions.  The bony calvarium is intact.  The visualized paranasal sinuses and mastoid air cells are clear.  IMPRESSION: No acute intracranial findings or mass lesions.  No change since prior examination.  Original Report Authenticated  By: P. Loralie Champagne, M.D.   Dg Chest Port 1 View  07/23/2011  *RADIOLOGY REPORT*  Clinical Data: Respiratory failure  PORTABLE CHEST - 1 VIEW  Comparison: 07/22/2011  Findings:  Heart size is normal.  There is no pleural effusion identified. Bilateral lower lobe airspace opacities are again noted.  Compared with previous exam there is improved aeration to the right base.  IMPRESSION:  1.  Interval improvement in aeration to the right base. 2.  Persistent left base opacity.  Original Report Authenticated By: Rosealee Albee, M.D.   Dg Chest Port 1 View  07/21/2011  *RADIOLOGY REPORT*  Clinical Data: Fever, tachycardia  PORTABLE CHEST - 1 VIEW  Comparison: Portable chest x-ray of 07/20/2011  Findings: The lungs are not well aerated.  Moderate cardiomegaly is stable and there may be mild pulmonary vascular congestion present. No focal infiltrate or effusion is seen.  No bony abnormality is noted.  IMPRESSION: Stable cardiomegaly.  Question mild pulmonary vascular congestion.  Original Report Authenticated By: Juline Patch, M.D.   Dg Chest Port 1 View  07/20/2011  *RADIOLOGY REPORT*  Clinical Data: Assess endotracheal tube.  PORTABLE CHEST - 1 VIEW  Comparison: 07/19/2011.  Findings: Endotracheal tube tip is not visualized on present exam.  Cardiomegaly.  Pulmonary vascular congestion.  No segmental  consolidation.  No gross pneumothorax.  IMPRESSION: Endotracheal tube tip is not visualized on the present exam.  Cardiomegaly.  Pulmonary vascular congestion.  Original Report Authenticated By: Fuller Canada, M.D.   Dg Chest Port 1 View  07/19/2011  *RADIOLOGY REPORT*  Clinical Data: Chest pain.  Altered mental status.  PORTABLE CHEST - 1 VIEW  Comparison: Portable chest 01/10/2010.  Findings: The heart is mildly enlarged, as before.  The lung volumes have decreased.  Mild pulmonary vascular congestion is now present.  Mild bibasilar atelectasis is noted.  The right IJ line has been removed.  IMPRESSION:  1.  Borderline cardiomegaly and mild pulmonary vascular congestion. 2.  Low lung volumes. 3.  Mild bibasilar atelectasis without other focal airspace disease.  Original Report Authenticated By: Jamesetta Orleans. MATTERN, M.D.   Dg Chest Port 1v Same Day  07/22/2011  *RADIOLOGY REPORT*  Clinical Data: Respiratory failure  PORTABLE CHEST - 1 VIEW SAME DAY  Comparison: 07/21/2011  Findings: Heart size appears normal.  No pleural effusion noted.  Streaky bibasilar opacities are unchanged from previous exam.  No focal airspace consolidation identified.  IMPRESSION:  1.  No change in aeration to the lung bases compared with prior exam.  Original Report Authenticated By: Rosealee Albee, M.D.   Dg Chest Port 1v Same Day  07/19/2011  *RADIOLOGY REPORT*  Clinical Data: Aspiration.  PORTABLE CHEST - 1 VIEW SAME DAY  Comparison: Earlier film, same date.  Findings: The heart is borderline enlarged but stable.  There is mild vascular congestion without overt pulmonary edema.  Persistent bibasilar atelectasis.  No definite infiltrates or effusions.  IMPRESSION: Stable chest x-ray.  Original Report Authenticated By: P. Loralie Champagne, M.D.    Medications: Scheduled Meds:   . antiseptic oral rinse  15 mL Mouth Rinse q12n4p  . chlorhexidine  15 mL Mouth Rinse BID  . Fluticasone-Salmeterol  1 puff Inhalation BID  .  furosemide  100 mg Intravenous Once  . imipenem-cilastatin  500 mg Intravenous Q12H  . insulin aspart  0-9 Units Subcutaneous Q4H  . insulin glargine  25 Units Subcutaneous QHS  . pantoprazole  40 mg Oral Q1200  . phosphorus  250 mg Oral TID  . potassium chloride  10 mEq Intravenous Q1 Hr x 4  . potassium chloride  40 mEq Oral BID  . DISCONTD: insulin regular  0-10 Units Intravenous TID WC   Continuous Infusions:   . DISCONTD: sodium chloride 50 mL/hr at 07/26/11 0932  . DISCONTD: dextrose 5 % and 0.45% NaCl 50 mL/hr at 07/26/11 1522  . DISCONTD: insulin (NOVOLIN-R) infusion 4 Units/hr (07/26/11 1019)  . DISCONTD: insulin (NOVOLIN-R) infusion Stopped (07/27/11 0115)     Assessment/Plan: Principal Problem:  *DKA (diabetic ketoacidoses) - Resolved, continue sliding scale insulin, off IV insulin now - Continue Lantus, sliding scale insulin  Active Problems:  Acute respiratory failure: Resolved   Hypoxemia: Likely secondary to pneumonia versus COPD - Continue Advair, imipenem, was given Lasix   Pulmonary edema: Improved, received Lasix IV   Acute renal failure: Improving  HTN (hypertension):    Pancreatitis, acute: Lipase still elevated, general surgery following, continue n.p.o., IV fluids, pain control   DVT Prophylaxis: SCDs  Code Status: Full code  Disposition: Not medically ready   LOS: 8 days   Kobie Matkins M.D. [facility] 07/27/2011, 3:15 PM Pager: [phone]

## 2011-07-28 LAB — LIPASE, BLOOD: Lipase: 464 U/L — ABNORMAL HIGH (ref 11–59)

## 2011-07-28 LAB — DIFFERENTIAL
Basophils Relative: 1 % (ref 0–1)
Eosinophils Absolute: 0.2 10*3/uL (ref 0.0–0.7)
Eosinophils Relative: 1 % (ref 0–5)
Lymphocytes Relative: 29 % (ref 12–46)
Monocytes Absolute: 1.5 10*3/uL — ABNORMAL HIGH (ref 0.1–1.0)
Neutrophils Relative %: 61 % (ref 43–77)

## 2011-07-28 LAB — GLUCOSE, CAPILLARY
Glucose-Capillary: 302 mg/dL — ABNORMAL HIGH (ref 70–99)
Glucose-Capillary: 274 mg/dL — ABNORMAL HIGH (ref 70–99)

## 2011-07-28 LAB — RENAL FUNCTION PANEL
Creatinine, Ser: 2.56 mg/dL — ABNORMAL HIGH (ref 0.50–1.35)
GFR calc Af Amer: 31 mL/min — ABNORMAL LOW (ref >90)

## 2011-07-28 LAB — CBC
Hemoglobin: 8.5 g/dL — ABNORMAL LOW (ref 13.0–17.0)
Platelets: 322 10*3/uL (ref 150–400)
RBC: 2.62 MIL/uL — ABNORMAL LOW (ref 4.22–5.81)

## 2011-07-28 LAB — CULTURE, BLOOD (ROUTINE X 2)
Culture  Setup Time: 201304060314
Culture: NO GROWTH

## 2011-07-28 MED ORDER — FUROSEMIDE 80 MG PO TABS
80.0000 mg | ORAL_TABLET | Freq: Every day | ORAL | Status: AC
  Administered 2011-07-28 – 2011-07-29 (×2): 80 mg via ORAL
  Filled 2011-07-28 (×2): qty 1

## 2011-07-28 MED ORDER — SODIUM CHLORIDE 0.9 % IV SOLN
250.0000 mg | Freq: Four times a day (QID) | INTRAVENOUS | Status: DC
  Administered 2011-07-28 – 2011-08-01 (×17): 250 mg via INTRAVENOUS
  Filled 2011-07-28 (×20): qty 250

## 2011-07-28 MED ORDER — INSULIN GLARGINE 100 UNIT/ML ~~LOC~~ SOLN
30.0000 [IU] | Freq: Every day | SUBCUTANEOUS | Status: DC
  Administered 2011-07-28: 30 [IU] via SUBCUTANEOUS

## 2011-07-28 MED ORDER — INSULIN ASPART 100 UNIT/ML ~~LOC~~ SOLN
3.0000 [IU] | Freq: Three times a day (TID) | SUBCUTANEOUS | Status: DC
  Administered 2011-07-28 – 2011-07-29 (×2): 3 [IU] via SUBCUTANEOUS

## 2011-07-28 NOTE — Progress Notes (Signed)
Clinical Social Work Department BRIEF PSYCHOSOCIAL ASSESSMENT 07/28/2011  Patient:  Equihua,Keithan     Account Number:  [id number]     Admit date:  07/19/2011  Clinical Social Worker:  Dennison Bulla  Date/Time:  07/28/2011 11:30 AM  Referred by:  Physician  Date Referred:  07/28/2011 Referred for  Transportation assistance   Other Referral:   Interview type:  Patient Other interview type:   Dtr-Tiffany present    PSYCHOSOCIAL DATA Living Status:  ALONE Admitted from facility:   Level of care:   Primary support name:  Tiffany Primary support relationship to patient:  CHILD, ADULT Degree of support available:   Strong    CURRENT CONCERNS Current Concerns  Other - See comment   Other Concerns:   Transportation    SOCIAL WORK ASSESSMENT / PLAN CSW received referral to assist with transportation needs. CSW met with patient and dtr who stated that dtr works and is unable to assist with rides to dr visits. CSW spoke with patient who reported he has Medicaid. CSW gave patient information for [employer] and dtr stated she would assist with patient applying. CSW is signing off but available if needed.   Assessment/plan status:  No Further Intervention Required Other assessment/ plan:   Information/referral to community resources:   [employer] referral    PATIENT'S/FAMILY'S RESPONSE TO PLAN OF CARE: Patient and dtr were engaged throughout assessment. Patient was pleasant and appreciative of CSW visit. Dtr agreeable to assisting patient.

## 2011-07-28 NOTE — Progress Notes (Signed)
[facility]  Subjective:  Awake, alert, taking clear liquids.  Says abd pain better   Objective: Vital signs in last 24 hours: Blood pressure 113/63, pulse 105, temperature 98.2 F (36.8 C), temperature source Oral, resp. rate 16, height 5\' 8"  (1.727 m), weight 91.3 kg (201 lb 4.5 oz), SpO2 99.00%.    PHYSICAL EXAM General--as above Chest--clear Heart--no rub Abd--no epigastric tenderness Extr--tr edema    I/O yesterday 222/1800, today 240/225  Wt today 91.3 kg (83.1 kg on 3 Apr)  Lab Results:   Lab 07/28/11 0820 07/27/11 2017 07/27/11 1235 07/27/11 0319 07/25/11 0630  NA 145 141 140 -- --  K 3.8 3.6 3.5 -- --  CL 113* 108 107 -- --  CO2 17* 21 18* -- --  BUN 27* 26* 27* -- --  CREATININE 2.56* 2.67* 2.78* -- --  ALB -- -- -- -- --  GLUCOSE 315* -- -- -- --  CALCIUM 8.9 9.1 8.7 -- --  PHOS 3.7 -- -- 3.6 1.1*     Basename 07/28/11 0820 07/26/11 2206  WBC 18.5* 20.9*  HGB 8.5* 9.5*  HCT 25.2* 27.6*  PLT 322 219    Assessment/Plan: 1.  Acute kidney injury--Cr starting to fall for 1st time. Still appears a little vol expanded.  Will rx po lasix today and in AM.  Repeat UA--3-6 WBC, 3-6 RBC, hyaline casts, 100 mg/dl glu, 401 mg/dl pro, mod hgb, UPEP pending 2.  Fe/TIBC not done, ferritin 2857--therefore no IV Fe 3.  DM with DKA on adm--on insulin 4.  Hep C +--no suggestions 5.  COPD--per primary service 6.  Gall stones--surg is following 7.  Pancreatitis--less epigastric pain.       LOS: 9 days   Lestat Golob F 07/28/2011,3:28 PM   .labalb

## 2011-07-28 NOTE — Progress Notes (Signed)
Repeat labs in AM Patient examined and I agree with the assessment and plan  Violeta Gelinas, MD, MPH, FACS Pager: [phone]  07/28/2011 3:43 PM

## 2011-07-28 NOTE — Progress Notes (Signed)
Patient ID: Ethan Jordan, male   DOB: Mar 20, 1956, 56 y.o.   MRN: [id number]    Subjective: Pt says pain is a little better.  No nausea.  Objective: Vital signs in last 24 hours: Temp:  [97.8 F (36.6 C)-99.2 F (37.3 C)] 98.6 F (37 C) (04/12 0503) Pulse Rate:  [70-111] 111  (04/12 0503) Resp:  [18-20] 18  (04/12 0503) BP: (124-134)/(54-81) 128/72 mmHg (04/12 0503) SpO2:  [96 %-100 %] 100 % (04/12 0503) Weight:  [201 lb 4.5 oz (91.3 kg)] 201 lb 4.5 oz (91.3 kg) (04/11 2023) Last BM Date: 07/27/11  Intake/Output from previous day: 04/11 0701 - 04/12 0700 In: 222 [P.O.:222] Out: 1800 [Urine:1800] Intake/Output this shift:    PE: Abd: soft, still tender in L upper and lower abdomen, +BS, ND Heart: tachy Lungs: CTAB  Lab Results:   Basename 07/26/11 2206 07/26/11 0630  WBC 20.9* 19.3*  HGB 9.5* 8.9*  HCT 27.6* 26.5*  PLT 219 180   BMET  Basename 07/27/11 2017 07/27/11 1235  NA 141 140  K 3.6 3.5  CL 108 107  CO2 21 18*  GLUCOSE 279* 367*  BUN 26* 27*  CREATININE 2.67* 2.78*  CALCIUM 9.1 8.7   PT/INR No results found for this basename: LABPROT:2,INR:2 in the last 72 hours CMP     Component Value Date/Time   NA 141 07/27/2011 2017   K 3.6 07/27/2011 2017   CL 108 07/27/2011 2017   CO2 21 07/27/2011 2017   GLUCOSE 279* 07/27/2011 2017   BUN 26* 07/27/2011 2017   CREATININE 2.67* 07/27/2011 2017   CALCIUM 9.1 07/27/2011 2017   PROT 7.1 07/27/2011 0319   ALBUMIN 2.1* 07/27/2011 0319   AST 48* 07/27/2011 0319   ALT 29 07/27/2011 0319   ALKPHOS 85 07/27/2011 0319   BILITOT 1.3* 07/27/2011 0319   GFRNONAA 25* 07/27/2011 2017   GFRAA 30* 07/27/2011 2017   Lipase     Component Value Date/Time   LIPASE 536* 07/27/2011 0319       Studies/Results: Ct Abdomen Pelvis Wo Contrast  07/26/2011  *RADIOLOGY REPORT*  Clinical Data: History hepatitis C.  Abdominal pain.  Acute renal failure.  Pancreatitis.  CT ABDOMEN AND PELVIS WITHOUT CONTRAST  Technique:  Multidetector CT  imaging of the abdomen and pelvis was performed following the standard protocol without intravenous contrast.  Comparison: CT scan 07/20/2011  Findings: There is nodular air space disease in the right lower lobe suggesting pneumonia or aspiration pneumonitis.  No pericardial fluid.  Non-IV contrast images demonstrate no focal hepatic lesion.  There are small gallstones within the gallbladder.  No gallbladder distention.  No evidence of biliary ductal dilatation.  There is interval improvement in the peripancreatic inflammation seen on most recent comparison exam.  The pancreas remains mildly edematous through the head.  There are no organized fluid collections.  The spleen and adrenal glands are normal.  The kidneys are slightly enlarged compared to the exam on 07/20/2011.  No evidence of hydronephrosis.  There is a nonobstructing 1 mm calculus in the mid left kidney.  There is no evidence of ureterolithiasis or obstructive uropathy.  No distal ureteral stones or bladder stones.  The bladder is mildly distended with calculated volume of 180 ml.  The stomach, small bowel, appendix, and cecum are normal.  The colon and rectosigmoid colon are normal.  Abdominal aorta normal caliber.  No retroperitoneal periportal lymphadenopathy.  No free fluid the pelvis.  The prostate gland is normal.  No pelvic  lymphadenopathy.  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  No acute osseous findings.  IMPRESSION:  1.  Right lower lobe pneumonia versus aspiration pneumonitis. 2.  No evidence of obstructive uropathy of the left or right kidney.  The kidneys are enlarged slightly compared to exam 6 days prior. 3.  Nonobstructing tiny calculus within the left kidney. 4.  Bladder is minimally distended. 5.  Improvement in the pancreatic inflammation.  Original Report Authenticated By: Genevive Bi, M.D.    Anti-infectives: Anti-infectives     Start     Dose/Rate Route Frequency Ordered Stop   07/26/11 1000    imipenem-cilastatin (PRIMAXIN) 500 mg in sodium chloride 0.9 % 100 mL IVPB        500 mg 200 mL/hr over 30 Minutes Intravenous Every 12 hours 07/26/11 0807     07/22/11 1500   vancomycin (VANCOCIN) IVPB 1000 mg/200 mL premix  Status:  Discontinued        1,000 mg 200 mL/hr over 60 Minutes Intravenous Every 8 hours 07/22/11 1414 07/25/11 0848   07/22/11 1415   piperacillin-tazobactam (ZOSYN) IVPB 3.375 g  Status:  Discontinued        3.375 g 12.5 mL/hr over 240 Minutes Intravenous 3 times per day 07/22/11 1414 07/25/11 0848   07/19/11 2100   ciprofloxacin (CIPRO) IVPB 400 mg  Status:  Discontinued        400 mg 200 mL/hr over 60 Minutes Intravenous Every 24 hours 07/19/11 2046 07/20/11 1103   07/19/11 2015   metroNIDAZOLE (FLAGYL) IVPB 500 mg  Status:  Discontinued        500 mg 100 mL/hr over 60 Minutes Intravenous Every 8 hours 07/19/11 2004 07/20/11 1103   07/19/11 2015   ciprofloxacin (CIPRO) IVPB 400 mg  Status:  Discontinued        400 mg 200 mL/hr over 60 Minutes Intravenous Every 8 hours 07/19/11 2004 07/19/11 2046           Assessment/Plan  1. Gallstone pancreatitis 2. DKA 3. ARF  Plan: 1. The patient still seems to be having quite a bit of abdominal pain that doesn't seem much improved to me from yesterday, but is relatively mild in nature.  I do not think he is quite ready for surgical intervention yet.  We will recheck labs in the morning and see where they are headed. 2.  Trial of clears, if worsening pain or nausea, back down to NPO again.   LOS: 9 days    Caz Weaver E 07/28/2011

## 2011-07-28 NOTE — Progress Notes (Signed)
Patient ID: Ethan Jordan  male  HYQ:[id number]    DOB: Jan 09, 1956    DOA: 07/19/2011  PCP: No primary provider on file.  Subjective: Abdominal pain improving, tolerating clears  Objective: Weight change: 1.7 kg (3 lb 12 oz)  Intake/Output Summary (Last 24 hours) at 07/28/11 1602 Last data filed at 07/28/11 1403  Gross per 24 hour  Intake    240 ml  Output   1225 ml  Net   -985 ml   Blood pressure 113/63, pulse 105, temperature 98.2 F (36.8 C), temperature source Oral, resp. rate 16, height 5\' 8"  (1.727 m), weight 91.3 kg (201 lb 4.5 oz), SpO2 99.00%.  Physical Exam: General: Alert and awake, oriented x3, not in any acute distress. HEENT: anicteric sclera, pupils reactive to light and accommodation, EOMI CVS: S1-S2 clear, no murmur rubs or gallops Chest: clear to auscultation bilaterally, no wheezing, rales or rhonchi Abdomen: Mild tenderness in right upper quadrant, nondistended, normal bowel sounds, no organomegaly Extremities: no cyanosis, clubbing or edema noted bilaterally   Lab Results: Basic Metabolic Panel:  Lab 07/28/11 9528 07/27/11 2017 07/25/11 0630  NA 145 141 --  K 3.8 3.6 --  CL 113* 108 --  CO2 17* 21 --  GLUCOSE 315* 279* --  BUN 27* 26* --  CREATININE 2.56* 2.67* --  CALCIUM 8.9 9.1 --  MG -- -- 2.3  PHOS 3.7 -- --   Liver Function Tests:  Lab 07/28/11 0820 07/27/11 0319 07/26/11 1320  AST -- 48* 57*  ALT -- 29 33  ALKPHOS -- 85 167*  BILITOT -- 1.3* 1.2  PROT -- 7.1 7.4  ALBUMIN 2.2* 2.1* --    Lab 07/28/11 0820 07/27/11 0319 07/23/11 0540  LIPASE 464* 536* --  AMYLASE -- -- 412*   No results found for this basename: AMMONIA:2 in the last 168 hours CBC:  Lab 07/28/11 0820 07/26/11 2206  WBC 18.5* 20.9*  NEUTROABS 11.2* --  HGB 8.5* 9.5*  HCT 25.2* 27.6*  MCV 96.2 94.8  PLT 322 219   Cardiac Enzymes:  Lab 07/27/11 0319  CKTOTAL 178  CKMB --  CKMBINDEX --  TROPONINI --   BNP: No components found with this basename:  POCBNP:2 CBG:  Lab 07/28/11 1144 07/28/11 0754 07/27/11 2352 07/27/11 2016 07/27/11 1626  GLUCAP 274* 302* 225* 298* 239*     Micro Results: Recent Results (from the past 240 hour(s))  CULTURE, BLOOD (ROUTINE X 2)     Status: Normal   Collection Time   07/19/11  4:30 PM      Component Value Range Status Comment   Specimen Description BLOOD ARM LEFT   Final    Special Requests BOTTLES DRAWN AEROBIC ONLY 5CC   Final    Culture  Setup Time [id number]   Final    Culture NO GROWTH 5 DAYS   Final    Report Status 07/26/2011 FINAL   Final   CULTURE, BLOOD (ROUTINE X 2)     Status: Normal   Collection Time   07/19/11  4:45 PM      Component Value Range Status Comment   Specimen Description BLOOD HAND LEFT   Final    Special Requests BOTTLES DRAWN AEROBIC ONLY 5CC   Final    Culture  Setup Time [id number]   Final    Culture NO GROWTH 5 DAYS   Final    Report Status 07/26/2011 FINAL   Final   MRSA PCR SCREENING     Status: Normal  Collection Time   07/19/11  8:28 PM      Component Value Range Status Comment   MRSA by PCR NEGATIVE  NEGATIVE  Final   URINE CULTURE     Status: Normal   Collection Time   07/19/11  8:29 PM      Component Value Range Status Comment   Specimen Description URINE, CATHETERIZED   Final    Special Requests NONE   Final    Culture  Setup Time [id number]   Final    Colony Count NO GROWTH   Final    Culture NO GROWTH   Final    Report Status 07/20/2011 FINAL   Final   CULTURE, BLOOD (ROUTINE X 2)     Status: Normal   Collection Time   07/19/11  8:55 PM      Component Value Range Status Comment   Specimen Description BLOOD LEFT ARM   Final    Special Requests BOTTLES DRAWN AEROBIC AND ANAEROBIC 10CC   Final    Culture  Setup Time [id number]   Final    Culture NO GROWTH 5 DAYS   Final    Report Status 07/26/2011 FINAL   Final   CULTURE, BLOOD (ROUTINE X 2)     Status: Normal   Collection Time   07/19/11  9:00 PM      Component Value Range Status Comment    Specimen Description BLOOD RIGHT ARM   Final    Special Requests BOTTLES DRAWN AEROBIC AND ANAEROBIC 10CC   Final    Culture  Setup Time [id number]   Final    Culture NO GROWTH 5 DAYS   Final    Report Status 07/26/2011 FINAL   Final   CULTURE, BLOOD (ROUTINE X 2)     Status: Normal   Collection Time   07/21/11  8:45 PM      Component Value Range Status Comment   Specimen Description BLOOD LEFT HAND   Final    Special Requests BOTTLES DRAWN AEROBIC ONLY 5CC   Final    Culture  Setup Time [id number]   Final    Culture NO GROWTH 5 DAYS   Final    Report Status 07/28/2011 FINAL   Final   CULTURE, BLOOD (ROUTINE X 2)     Status: Normal   Collection Time   07/21/11  8:55 PM      Component Value Range Status Comment   Specimen Description BLOOD RIGHT ARM   Final    Special Requests BOTTLES DRAWN AEROBIC ONLY 10CC   Final    Culture  Setup Time [id number]   Final    Culture NO GROWTH 5 DAYS   Final    Report Status 07/28/2011 FINAL   Final   URINE CULTURE     Status: Normal   Collection Time   07/26/11 12:58 PM      Component Value Range Status Comment   Specimen Description URINE, CLEAN CATCH   Final    Special Requests NONE   Final    Culture  Setup Time [id number]   Final    Colony Count NO GROWTH   Final    Culture NO GROWTH   Final    Report Status 07/27/2011 FINAL   Final   CULTURE, BLOOD (ROUTINE X 2)     Status: Normal (Preliminary result)   Collection Time   07/26/11  1:10 PM      Component Value Range Status Comment   Specimen Description BLOOD LEFT  ARM   Final    Special Requests BOTTLES DRAWN AEROBIC AND ANAEROBIC 10CC   Final    Culture  Setup Time [id number]   Final    Culture     Final    Value:        BLOOD CULTURE RECEIVED NO GROWTH TO DATE CULTURE WILL BE HELD FOR 5 DAYS BEFORE ISSUING A FINAL NEGATIVE REPORT   Report Status PENDING   Incomplete   CULTURE, BLOOD (ROUTINE X 2)     Status: Normal (Preliminary result)   Collection Time   07/26/11  1:20 PM       Component Value Range Status Comment   Specimen Description BLOOD LEFT HAND   Final    Special Requests BOTTLES DRAWN AEROBIC ONLY 8CC   Final    Culture  Setup Time [id number]   Final    Culture     Final    Value:        BLOOD CULTURE RECEIVED NO GROWTH TO DATE CULTURE WILL BE HELD FOR 5 DAYS BEFORE ISSUING A FINAL NEGATIVE REPORT   Report Status PENDING   Incomplete     Studies/Results: Ct Abdomen Pelvis Wo Contrast  07/26/2011  *RADIOLOGY REPORT*  Clinical Data: History hepatitis C.  Abdominal pain.  Acute renal failure.  Pancreatitis.  CT ABDOMEN AND PELVIS WITHOUT CONTRAST  Technique:  Multidetector CT imaging of the abdomen and pelvis was performed following the standard protocol without intravenous contrast.  Comparison: CT scan 07/20/2011  Findings: There is nodular air space disease in the right lower lobe suggesting pneumonia or aspiration pneumonitis.  No pericardial fluid.  Non-IV contrast images demonstrate no focal hepatic lesion.  There are small gallstones within the gallbladder.  No gallbladder distention.  No evidence of biliary ductal dilatation.  There is interval improvement in the peripancreatic inflammation seen on most recent comparison exam.  The pancreas remains mildly edematous through the head.  There are no organized fluid collections.  The spleen and adrenal glands are normal.  The kidneys are slightly enlarged compared to the exam on 07/20/2011.  No evidence of hydronephrosis.  There is a nonobstructing 1 mm calculus in the mid left kidney.  There is no evidence of ureterolithiasis or obstructive uropathy.  No distal ureteral stones or bladder stones.  The bladder is mildly distended with calculated volume of 180 ml.  The stomach, small bowel, appendix, and cecum are normal.  The colon and rectosigmoid colon are normal.  Abdominal aorta normal caliber.  No retroperitoneal periportal lymphadenopathy.  No free fluid the pelvis.  The prostate gland is normal.  No pelvic  lymphadenopathy.  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  No acute osseous findings.  IMPRESSION:  1.  Right lower lobe pneumonia versus aspiration pneumonitis. 2.  No evidence of obstructive uropathy of the left or right kidney.  The kidneys are enlarged slightly compared to exam 6 days prior. 3.  Nonobstructing tiny calculus within the left kidney. 4.  Bladder is minimally distended. 5.  Improvement in the pancreatic inflammation.  Original Report Authenticated By: Genevive Bi, M.D.   Ct Abdomen Pelvis Wo Contrast  07/20/2011  *RADIOLOGY REPORT*  Clinical Data: Diabetic ketoacidosis.  Abdominal pain.  Possible sepsis.  CT ABDOMEN AND PELVIS WITHOUT CONTRAST  Technique:  Multidetector CT imaging of the abdomen and pelvis was performed following the standard protocol without intravenous contrast.  Comparison: 04/10/2009.  Findings: The lung bases demonstrate a small effusions and bibasilar atelectasis.  The  heart is borderline enlarged.  The unenhanced appearance of the liver is unremarkable.  No focal lesions or biliary dilatation.  Small gallstones are noted in the gallbladder.  No distention or inflammation.  The common bile duct is normal in caliber.  The pancreas is mildly enlarged and inflamed with peripancreatic inflammatory changes also involving the second portion of the duodenum and also of the mesocolon.  Findings consistent with acute pancreatitis.  The spleen is normal in size.  No focal lesions.  The adrenal glands and kidneys are normal.  No renal or obstructing ureteral calculi.  The stomach is unremarkable.  The third and fourth portion of the duodenum are normal.  The small bowel is unremarkable.  Dense material is noted the colon.  This is likely ingested material such as Pepto-Bismol.  Mild fibrofatty infiltrative changes involving the colon may suggest a remote inflammatory bowel disease.  No active inflammation is demonstrated.  The appendix is normal. Dense material is  noted in the appendix.  The aorta is normal in caliber.  Mild atherosclerotic changes.  No mesenteric or retroperitoneal masses or adenopathy.  There are small scattered lymph nodes typically in the periportal and celiac axis region.  There is a Foley catheter in the bladder.  The prostate gland and seminal vesicles appear normal.  There is artifact through the lower pelvis due to a right hip prosthesis.  No pelvic mass, adenopathy or free pelvic fluid collections.  No inguinal mass or hernia.  The bony structures are intact.  IMPRESSION:  1.  CT findings consistent with acute pancreatitis. 2.  Cholelithiasis. 3.  No other significant abdominal/pelvic findings. 4.  Small bilateral pleural effusions and bibasilar atelectasis.  Original Report Authenticated By: P. Loralie Champagne, M.D.   Dg Chest 2 View  07/25/2011  *RADIOLOGY REPORT*  Clinical Data: Follow-up pneumonia  CHEST - 2 VIEW  Comparison: Chest radiograph 07/23/2011, and multiple priors dating back to 01/03/2010  Findings: Heart size is within normal limits.  Mediastinal hilar contours are stable the trachea is midline.  There is slight pulmonary vascular congestion. Faint, patchy airspace opacities in the lung bases, left greater than right, are similar to the chest radiograph of 07/23/2011.  On the lateral view, a very small unilateral pleural effusion is seen. The bony thorax is unremarkable.  IMPRESSION:  1.  Faint patchy bibasilar airspace opacities, left greater than right without significant change. 2.  Very small unilateral pleural effusion, seen on the lateral view.  Original Report Authenticated By: Britta Mccreedy, M.D.   Ct Head Wo Contrast  07/19/2011  *RADIOLOGY REPORT*  Clinical Data: Altered mental status.  CT HEAD WITHOUT CONTRAST  Technique:  Contiguous axial images were obtained from the base of the skull through the vertex without contrast.  Comparison: Head CT 03/29/2007.  Findings: The ventricles are normal.  No extra-axial fluid  collections are seen.  The brainstem and cerebellum are unremarkable.  No acute intracranial findings such as infarction or hemorrhage.  No mass lesions.  The bony calvarium is intact.  The visualized paranasal sinuses and mastoid air cells are clear.  IMPRESSION: No acute intracranial findings or mass lesions.  No change since prior examination.  Original Report Authenticated By: P. Loralie Champagne, M.D.   Dg Chest Port 1 View  07/23/2011  *RADIOLOGY REPORT*  Clinical Data: Respiratory failure  PORTABLE CHEST - 1 VIEW  Comparison: 07/22/2011  Findings:  Heart size is normal.  There is no pleural effusion identified. Bilateral lower lobe airspace opacities are again  noted.  Compared with previous exam there is improved aeration to the right base.  IMPRESSION:  1.  Interval improvement in aeration to the right base. 2.  Persistent left base opacity.  Original Report Authenticated By: Rosealee Albee, M.D.   Dg Chest Port 1 View  07/21/2011  *RADIOLOGY REPORT*  Clinical Data: Fever, tachycardia  PORTABLE CHEST - 1 VIEW  Comparison: Portable chest x-ray of 07/20/2011  Findings: The lungs are not well aerated.  Moderate cardiomegaly is stable and there may be mild pulmonary vascular congestion present. No focal infiltrate or effusion is seen.  No bony abnormality is noted.  IMPRESSION: Stable cardiomegaly.  Question mild pulmonary vascular congestion.  Original Report Authenticated By: Juline Patch, M.D.   Dg Chest Port 1 View  07/20/2011  *RADIOLOGY REPORT*  Clinical Data: Assess endotracheal tube.  PORTABLE CHEST - 1 VIEW  Comparison: 07/19/2011.  Findings: Endotracheal tube tip is not visualized on present exam.  Cardiomegaly.  Pulmonary vascular congestion.  No segmental consolidation.  No gross pneumothorax.  IMPRESSION: Endotracheal tube tip is not visualized on the present exam.  Cardiomegaly.  Pulmonary vascular congestion.  Original Report Authenticated By: Fuller Canada, M.D.   Dg Chest Port 1  View  07/19/2011  *RADIOLOGY REPORT*  Clinical Data: Chest pain.  Altered mental status.  PORTABLE CHEST - 1 VIEW  Comparison: Portable chest 01/10/2010.  Findings: The heart is mildly enlarged, as before.  The lung volumes have decreased.  Mild pulmonary vascular congestion is now present.  Mild bibasilar atelectasis is noted.  The right IJ line has been removed.  IMPRESSION:  1.  Borderline cardiomegaly and mild pulmonary vascular congestion. 2.  Low lung volumes. 3.  Mild bibasilar atelectasis without other focal airspace disease.  Original Report Authenticated By: Jamesetta Orleans. MATTERN, M.D.   Dg Chest Port 1v Same Day  07/22/2011  *RADIOLOGY REPORT*  Clinical Data: Respiratory failure  PORTABLE CHEST - 1 VIEW SAME DAY  Comparison: 07/21/2011  Findings: Heart size appears normal.  No pleural effusion noted.  Streaky bibasilar opacities are unchanged from previous exam.  No focal airspace consolidation identified.  IMPRESSION:  1.  No change in aeration to the lung bases compared with prior exam.  Original Report Authenticated By: Rosealee Albee, M.D.   Dg Chest Port 1v Same Day  07/19/2011  *RADIOLOGY REPORT*  Clinical Data: Aspiration.  PORTABLE CHEST - 1 VIEW SAME DAY  Comparison: Earlier film, same date.  Findings: The heart is borderline enlarged but stable.  There is mild vascular congestion without overt pulmonary edema.  Persistent bibasilar atelectasis.  No definite infiltrates or effusions.  IMPRESSION: Stable chest x-ray.  Original Report Authenticated By: P. Loralie Champagne, M.D.    Medications: Scheduled Meds:    . antiseptic oral rinse  15 mL Mouth Rinse q12n4p  . chlorhexidine  15 mL Mouth Rinse BID  . Fluticasone-Salmeterol  1 puff Inhalation BID  . furosemide  80 mg Oral Daily  . imipenem-cilastatin  250 mg Intravenous Q6H  . insulin aspart  0-9 Units Subcutaneous Q4H  . insulin glargine  25 Units Subcutaneous QHS  . pantoprazole  40 mg Oral Q1200  . phosphorus  250 mg Oral  TID  . potassium chloride  40 mEq Oral BID  . DISCONTD: imipenem-cilastatin  500 mg Intravenous Q12H   Continuous Infusions:    Assessment/Plan: Principal Problem:  *DKA (diabetic ketoacidoses): HCO3 trending down, concerned that he can potentially go back in DKA, off IV insulin   -  Blood sugars uncontrolled, increase Lantus to 30 units, added meal coverage insulin as patient is started on clears today,   Active Problems:  Acute respiratory failure: Resolved   Hypoxemia: Likely secondary to pneumonia versus COPD - Continue Advair, imipenem, was given Lasix   Pulmonary edema: Improved, received Lasix IV   Acute renal failure: Improving - Nephrology following  HTN (hypertension):    Pancreatitis, acute: Lipase still elevated, general surgery following, - Started on clears today, pain control   DVT Prophylaxis: SCDs  Code Status: Full code  Disposition: Not medically ready   LOS: 9 days   Brysan Mcevoy M.D. [facility] 07/28/2011, 4:02 PM Pager: [phone]

## 2011-07-28 NOTE — Progress Notes (Signed)
Inpatient Diabetes Program Recommendations  AACE/ADA: New Consensus Statement on Inpatient Glycemic Control (2009)  Target Ranges:  Prepandial:   less than 140 mg/dL      Peak postprandial:   less than 180 mg/dL (1-2 hours)      Critically ill patients:  140 - 180 mg/dL   Reason for Visit: Hyperglycemia Pt transitioned off IV insulin drip perfectly.  However, glucose now back into 200's and 300's Inpatient Diabetes Program Recommendations Insulin - Basal: xxx Correction (SSI): Sensitive does not seem to be effective enough to control glucose.  Please increase to moderate q 4 hrs until controlled.   HgbA1C: xxx Outpatient Referral: Please order outpatient diabetes education Diet: xxx  Note: Moderate correction q 4 hrs may not be effective at this time either.  Can then increase to resistant q 4 hrs if needed.  Would not continue to increase Lantus though, as Lantus is too long acting in case the glucose improves; Novolog correction can be adjusted more quickly than the Lantus. Inpatient Diabetes Program Recommendations  AACE/ADA: New Consensus Statement on Inpatient Glycemic Control (2009)  Target Ranges:  Prepandial:   less than 140 mg/dL      Peak postprandial:   less than 180 mg/dL (1-2 hours)      Critically ill patients:  140 - 180 mg/dL   Reason for Visit: Hyperglycemia Pt transitioned of IV insulin drip per protocol.  However, cbg's have trended up again into 200's and 300's. Inpatient Diabetes Program Recommendations Insulin - Basal: xxx Correction (SSI): Sensitive does not seem to be effective enough to control glucose.  Please increase to moderate q 4 hrs until controlled.   HgbA1C: xxx Outpatient Referral: Please order outpatient diabetes education Diet: xxx  Note: May need to increase to resistant q 4 hrs if moderate does not improve control.  Would rather increase the Novolog rather than the Long-acting Lantus. Thank you, Lenor Coffin, RN, CNS, Diabetes Coordinator  [phone])

## 2011-07-28 NOTE — Progress Notes (Signed)
Pt very frustrated with multiple sticks for labs and CBGs; refusing all sticks at this time. Educated pt on why the labs/sticks are needed but pt states not right now. Ethan Jordan

## 2011-07-29 DIAGNOSIS — E1110 Type 2 diabetes mellitus with ketoacidosis without coma: Secondary | ICD-10-CM

## 2011-07-29 LAB — COMPREHENSIVE METABOLIC PANEL
ALT: 27 U/L (ref 0–53)
AST: 80 U/L — ABNORMAL HIGH (ref 0–37)
Albumin: 2.5 g/dL — ABNORMAL LOW (ref 3.5–5.2)
CO2: 22 mEq/L (ref 19–32)
Calcium: 9.6 mg/dL (ref 8.4–10.5)
Chloride: 109 mEq/L (ref 96–112)
Creatinine, Ser: 2.44 mg/dL — ABNORMAL HIGH (ref 0.50–1.35)
Sodium: 144 mEq/L (ref 135–145)

## 2011-07-29 LAB — CBC
Hemoglobin: 8.8 g/dL — ABNORMAL LOW (ref 13.0–17.0)
MCH: 32.1 pg (ref 26.0–34.0)
MCHC: 33.1 g/dL (ref 30.0–36.0)
MCV: 97.1 fL (ref 78.0–100.0)
RBC: 2.74 MIL/uL — ABNORMAL LOW (ref 4.22–5.81)

## 2011-07-29 LAB — URINALYSIS, ROUTINE W REFLEX MICROSCOPIC
Glucose, UA: NEGATIVE mg/dL
Ketones, ur: NEGATIVE mg/dL
Leukocytes, UA: NEGATIVE
Specific Gravity, Urine: 1.011 (ref 1.005–1.030)
pH: 5 (ref 5.0–8.0)

## 2011-07-29 LAB — GLUCOSE, CAPILLARY
Glucose-Capillary: 204 mg/dL — ABNORMAL HIGH (ref 70–99)
Glucose-Capillary: 223 mg/dL — ABNORMAL HIGH (ref 70–99)
Glucose-Capillary: 230 mg/dL — ABNORMAL HIGH (ref 70–99)
Glucose-Capillary: 264 mg/dL — ABNORMAL HIGH (ref 70–99)

## 2011-07-29 LAB — URINE MICROSCOPIC-ADD ON

## 2011-07-29 MED ORDER — INSULIN GLARGINE 100 UNIT/ML ~~LOC~~ SOLN
35.0000 [IU] | Freq: Every day | SUBCUTANEOUS | Status: DC
  Administered 2011-07-29 – 2011-07-30 (×2): 35 [IU] via SUBCUTANEOUS

## 2011-07-29 MED ORDER — INSULIN ASPART 100 UNIT/ML ~~LOC~~ SOLN
4.0000 [IU] | Freq: Three times a day (TID) | SUBCUTANEOUS | Status: DC
  Administered 2011-07-29 – 2011-08-04 (×16): 4 [IU] via SUBCUTANEOUS

## 2011-07-29 MED ORDER — FUROSEMIDE 10 MG/ML IJ SOLN
80.0000 mg | Freq: Two times a day (BID) | INTRAMUSCULAR | Status: AC
  Administered 2011-07-29 – 2011-07-30 (×2): 80 mg via INTRAVENOUS
  Filled 2011-07-29 (×2): qty 8

## 2011-07-29 NOTE — Progress Notes (Signed)
  Subjective: Feels better; less abdominal pain; no nausea or vomiting  Objective: Vital signs in last 24 hours: Temp:  [97.6 F (36.4 C)-98.5 F (36.9 C)] 98.3 F (36.8 C) (04/13 0433) Pulse Rate:  [95-109] 107  (04/13 0433) Resp:  [16-18] 18  (04/13 0433) BP: (113-132)/(63-78) 126/64 mmHg (04/13 0433) SpO2:  [95 %-99 %] 98 % (04/13 0433) Weight:  [203 lb 0.7 oz (92.1 kg)] 203 lb 0.7 oz (92.1 kg) (04/12 2141) Last BM Date: 07/29/11  Intake/Output from previous day: 04/12 0701 - 04/13 0700 In: 360 [P.O.:360] Out: 575 [Urine:575] Intake/Output this shift:    General appearance: alert, cooperative and no distress GI: soft, no obvious tenderness; no palpable masses  Lab Results:   Basename 07/29/11 0411 07/28/11 0820  WBC 19.8* 18.5*  HGB 8.8* 8.5*  HCT 26.6* 25.2*  PLT 385 322   BMET  Basename 07/29/11 0411 07/28/11 0820  NA 144 145  K 4.1 3.8  CL 109 113*  CO2 22 17*  GLUCOSE 273* 315*  BUN 26* 27*  CREATININE 2.44* 2.56*  CALCIUM 9.6 8.9   Lipase - 221 (down from 500's)   PT/INR No results found for this basename: LABPROT:2,INR:2 in the last 72 hours ABG No results found for this basename: PHART:2,PCO2:2,PO2:2,HCO3:2 in the last 72 hours  Studies/Results: No results found.  Anti-infectives: Anti-infectives     Start     Dose/Rate Route Frequency Ordered Stop   07/28/11 1200   imipenem-cilastatin (PRIMAXIN) 250 mg in sodium chloride 0.9 % 100 mL IVPB        250 mg 200 mL/hr over 30 Minutes Intravenous 4 times per day 07/28/11 0937     07/26/11 1000   imipenem-cilastatin (PRIMAXIN) 500 mg in sodium chloride 0.9 % 100 mL IVPB  Status:  Discontinued        500 mg 200 mL/hr over 30 Minutes Intravenous Every 12 hours 07/26/11 0807 07/28/11 0937   07/22/11 1500   vancomycin (VANCOCIN) IVPB 1000 mg/200 mL premix  Status:  Discontinued        1,000 mg 200 mL/hr over 60 Minutes Intravenous Every 8 hours 07/22/11 1414 07/25/11 0848   07/22/11 1415    piperacillin-tazobactam (ZOSYN) IVPB 3.375 g  Status:  Discontinued        3.375 g 12.5 mL/hr over 240 Minutes Intravenous 3 times per day 07/22/11 1414 07/25/11 0848   07/19/11 2100   ciprofloxacin (CIPRO) IVPB 400 mg  Status:  Discontinued        400 mg 200 mL/hr over 60 Minutes Intravenous Every 24 hours 07/19/11 2046 07/20/11 1103   07/19/11 2015   metroNIDAZOLE (FLAGYL) IVPB 500 mg  Status:  Discontinued        500 mg 100 mL/hr over 60 Minutes Intravenous Every 8 hours 07/19/11 2004 07/20/11 1103   07/19/11 2015   ciprofloxacin (CIPRO) IVPB 400 mg  Status:  Discontinued        400 mg 200 mL/hr over 60 Minutes Intravenous Every 8 hours 07/19/11 2004 07/19/11 2046          Assessment/Plan: s/p * No surgery found * Pancreatitis improving - lipase trending towards normal; still elevated; high WBC; continue antibiotics, glucose control; possible surgery Monday Would keep on clear liquids for now.   LOS: 10 days    Tinisha Etzkorn K. 07/29/2011

## 2011-07-29 NOTE — Progress Notes (Signed)
Patient ID: Ethan Jordan  male  ZOX:[id number]    DOB: 04-29-1955    DOA: 07/19/2011  PCP: No primary provider on file.  Subjective: Abdominal pain improving, tolerating clears  Objective: Weight change: 0.8 kg (1 lb 12.2 oz)  Intake/Output Summary (Last 24 hours) at 07/29/11 1142 Last data filed at 07/29/11 0900  Gross per 24 hour  Intake    360 ml  Output    550 ml  Net   -190 ml   Blood pressure 122/68, pulse 110, temperature 98 F (36.7 C), temperature source Oral, resp. rate 18, height 5\' 8"  (1.727 m), weight 92.1 kg (203 lb 0.7 oz), SpO2 100.00%.  Physical Exam: General: Alert and awake, oriented x3, not in any acute distress. HEENT: anicteric sclera, pupils reactive to light and accommodation, EOMI CVS: S1-S2 clear, no murmur rubs or gallops Chest: clear to auscultation bilaterally, no wheezing, rales or rhonchi Abdomen: Mild tenderness in right upper quadrant, nondistended, normal bowel sounds, no organomegaly Extremities: no cyanosis, clubbing or edema noted bilaterally   Lab Results: Basic Metabolic Panel:  Lab 07/29/11 8119 07/28/11 0820 07/25/11 0630  NA 144 145 --  K 4.1 3.8 --  CL 109 113* --  CO2 22 17* --  GLUCOSE 273* 315* --  BUN 26* 27* --  CREATININE 2.44* 2.56* --  CALCIUM 9.6 8.9 --  MG -- -- 2.3  PHOS -- 3.7 --   Liver Function Tests:  Lab 07/29/11 0411 07/28/11 0820 07/27/11 0319  AST 80* -- 48*  ALT 27 -- 29  ALKPHOS 94 -- 85  BILITOT 1.1 -- 1.3*  PROT 8.3 -- 7.1  ALBUMIN 2.5* 2.2* --    Lab 07/29/11 0411 07/28/11 0820 07/23/11 0540  LIPASE 221* 464* --  AMYLASE -- -- 412*   No results found for this basename: AMMONIA:2 in the last 168 hours CBC:  Lab 07/29/11 0411 07/28/11 0820  WBC 19.8* 18.5*  NEUTROABS -- 11.2*  HGB 8.8* 8.5*  HCT 26.6* 25.2*  MCV 97.1 96.2  PLT 385 322   Cardiac Enzymes:  Lab 07/27/11 0319  CKTOTAL 178  CKMB --  CKMBINDEX --  TROPONINI --   BNP: No components found with this basename:  POCBNP:2 CBG:  Lab 07/29/11 0735 07/29/11 0427 07/29/11 0017 07/28/11 2009 07/28/11 1753  GLUCAP 264* 230* 204* 232* 222*     Micro Results: Recent Results (from the past 240 hour(s))  CULTURE, BLOOD (ROUTINE X 2)     Status: Normal   Collection Time   07/19/11  4:30 PM      Component Value Range Status Comment   Specimen Description BLOOD ARM LEFT   Final    Special Requests BOTTLES DRAWN AEROBIC ONLY 5CC   Final    Culture  Setup Time [id number]   Final    Culture NO GROWTH 5 DAYS   Final    Report Status 07/26/2011 FINAL   Final   CULTURE, BLOOD (ROUTINE X 2)     Status: Normal   Collection Time   07/19/11  4:45 PM      Component Value Range Status Comment   Specimen Description BLOOD HAND LEFT   Final    Special Requests BOTTLES DRAWN AEROBIC ONLY 5CC   Final    Culture  Setup Time [id number]   Final    Culture NO GROWTH 5 DAYS   Final    Report Status 07/26/2011 FINAL   Final   MRSA PCR SCREENING     Status: Normal  Collection Time   07/19/11  8:28 PM      Component Value Range Status Comment   MRSA by PCR NEGATIVE  NEGATIVE  Final   URINE CULTURE     Status: Normal   Collection Time   07/19/11  8:29 PM      Component Value Range Status Comment   Specimen Description URINE, CATHETERIZED   Final    Special Requests NONE   Final    Culture  Setup Time [id number]   Final    Colony Count NO GROWTH   Final    Culture NO GROWTH   Final    Report Status 07/20/2011 FINAL   Final   CULTURE, BLOOD (ROUTINE X 2)     Status: Normal   Collection Time   07/19/11  8:55 PM      Component Value Range Status Comment   Specimen Description BLOOD LEFT ARM   Final    Special Requests BOTTLES DRAWN AEROBIC AND ANAEROBIC 10CC   Final    Culture  Setup Time [id number]   Final    Culture NO GROWTH 5 DAYS   Final    Report Status 07/26/2011 FINAL   Final   CULTURE, BLOOD (ROUTINE X 2)     Status: Normal   Collection Time   07/19/11  9:00 PM      Component Value Range Status Comment    Specimen Description BLOOD RIGHT ARM   Final    Special Requests BOTTLES DRAWN AEROBIC AND ANAEROBIC 10CC   Final    Culture  Setup Time [id number]   Final    Culture NO GROWTH 5 DAYS   Final    Report Status 07/26/2011 FINAL   Final   CULTURE, BLOOD (ROUTINE X 2)     Status: Normal   Collection Time   07/21/11  8:45 PM      Component Value Range Status Comment   Specimen Description BLOOD LEFT HAND   Final    Special Requests BOTTLES DRAWN AEROBIC ONLY 5CC   Final    Culture  Setup Time [id number]   Final    Culture NO GROWTH 5 DAYS   Final    Report Status 07/28/2011 FINAL   Final   CULTURE, BLOOD (ROUTINE X 2)     Status: Normal   Collection Time   07/21/11  8:55 PM      Component Value Range Status Comment   Specimen Description BLOOD RIGHT ARM   Final    Special Requests BOTTLES DRAWN AEROBIC ONLY 10CC   Final    Culture  Setup Time [id number]   Final    Culture NO GROWTH 5 DAYS   Final    Report Status 07/28/2011 FINAL   Final   URINE CULTURE     Status: Normal   Collection Time   07/26/11 12:58 PM      Component Value Range Status Comment   Specimen Description URINE, CLEAN CATCH   Final    Special Requests NONE   Final    Culture  Setup Time [id number]   Final    Colony Count NO GROWTH   Final    Culture NO GROWTH   Final    Report Status 07/27/2011 FINAL   Final   CULTURE, BLOOD (ROUTINE X 2)     Status: Normal (Preliminary result)   Collection Time   07/26/11  1:10 PM      Component Value Range Status Comment   Specimen Description BLOOD LEFT  ARM   Final    Special Requests BOTTLES DRAWN AEROBIC AND ANAEROBIC 10CC   Final    Culture  Setup Time [id number]   Final    Culture     Final    Value:        BLOOD CULTURE RECEIVED NO GROWTH TO DATE CULTURE WILL BE HELD FOR 5 DAYS BEFORE ISSUING A FINAL NEGATIVE REPORT   Report Status PENDING   Incomplete   CULTURE, BLOOD (ROUTINE X 2)     Status: Normal (Preliminary result)   Collection Time   07/26/11  1:20 PM       Component Value Range Status Comment   Specimen Description BLOOD LEFT HAND   Final    Special Requests BOTTLES DRAWN AEROBIC ONLY 8CC   Final    Culture  Setup Time [id number]   Final    Culture     Final    Value:        BLOOD CULTURE RECEIVED NO GROWTH TO DATE CULTURE WILL BE HELD FOR 5 DAYS BEFORE ISSUING A FINAL NEGATIVE REPORT   Report Status PENDING   Incomplete     Studies/Results: Ct Abdomen Pelvis Wo Contrast  07/26/2011  *RADIOLOGY REPORT*  Clinical Data: History hepatitis C.  Abdominal pain.  Acute renal failure.  Pancreatitis.  CT ABDOMEN AND PELVIS WITHOUT CONTRAST  Technique:  Multidetector CT imaging of the abdomen and pelvis was performed following the standard protocol without intravenous contrast.  Comparison: CT scan 07/20/2011  Findings: There is nodular air space disease in the right lower lobe suggesting pneumonia or aspiration pneumonitis.  No pericardial fluid.  Non-IV contrast images demonstrate no focal hepatic lesion.  There are small gallstones within the gallbladder.  No gallbladder distention.  No evidence of biliary ductal dilatation.  There is interval improvement in the peripancreatic inflammation seen on most recent comparison exam.  The pancreas remains mildly edematous through the head.  There are no organized fluid collections.  The spleen and adrenal glands are normal.  The kidneys are slightly enlarged compared to the exam on 07/20/2011.  No evidence of hydronephrosis.  There is a nonobstructing 1 mm calculus in the mid left kidney.  There is no evidence of ureterolithiasis or obstructive uropathy.  No distal ureteral stones or bladder stones.  The bladder is mildly distended with calculated volume of 180 ml.  The stomach, small bowel, appendix, and cecum are normal.  The colon and rectosigmoid colon are normal.  Abdominal aorta normal caliber.  No retroperitoneal periportal lymphadenopathy.  No free fluid the pelvis.  The prostate gland is normal.  No pelvic  lymphadenopathy.  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  No acute osseous findings.  IMPRESSION:  1.  Right lower lobe pneumonia versus aspiration pneumonitis. 2.  No evidence of obstructive uropathy of the left or right kidney.  The kidneys are enlarged slightly compared to exam 6 days prior. 3.  Nonobstructing tiny calculus within the left kidney. 4.  Bladder is minimally distended. 5.  Improvement in the pancreatic inflammation.  Original Report Authenticated By: Genevive Bi, M.D.   Ct Abdomen Pelvis Wo Contrast  07/20/2011  *RADIOLOGY REPORT*  Clinical Data: Diabetic ketoacidosis.  Abdominal pain.  Possible sepsis.  CT ABDOMEN AND PELVIS WITHOUT CONTRAST  Technique:  Multidetector CT imaging of the abdomen and pelvis was performed following the standard protocol without intravenous contrast.  Comparison: 04/10/2009.  Findings: The lung bases demonstrate a small effusions and bibasilar atelectasis.  The  heart is borderline enlarged.  The unenhanced appearance of the liver is unremarkable.  No focal lesions or biliary dilatation.  Small gallstones are noted in the gallbladder.  No distention or inflammation.  The common bile duct is normal in caliber.  The pancreas is mildly enlarged and inflamed with peripancreatic inflammatory changes also involving the second portion of the duodenum and also of the mesocolon.  Findings consistent with acute pancreatitis.  The spleen is normal in size.  No focal lesions.  The adrenal glands and kidneys are normal.  No renal or obstructing ureteral calculi.  The stomach is unremarkable.  The third and fourth portion of the duodenum are normal.  The small bowel is unremarkable.  Dense material is noted the colon.  This is likely ingested material such as Pepto-Bismol.  Mild fibrofatty infiltrative changes involving the colon may suggest a remote inflammatory bowel disease.  No active inflammation is demonstrated.  The appendix is normal. Dense material is  noted in the appendix.  The aorta is normal in caliber.  Mild atherosclerotic changes.  No mesenteric or retroperitoneal masses or adenopathy.  There are small scattered lymph nodes typically in the periportal and celiac axis region.  There is a Foley catheter in the bladder.  The prostate gland and seminal vesicles appear normal.  There is artifact through the lower pelvis due to a right hip prosthesis.  No pelvic mass, adenopathy or free pelvic fluid collections.  No inguinal mass or hernia.  The bony structures are intact.  IMPRESSION:  1.  CT findings consistent with acute pancreatitis. 2.  Cholelithiasis. 3.  No other significant abdominal/pelvic findings. 4.  Small bilateral pleural effusions and bibasilar atelectasis.  Original Report Authenticated By: P. Loralie Champagne, M.D.   Dg Chest 2 View  07/25/2011  *RADIOLOGY REPORT*  Clinical Data: Follow-up pneumonia  CHEST - 2 VIEW  Comparison: Chest radiograph 07/23/2011, and multiple priors dating back to 01/03/2010  Findings: Heart size is within normal limits.  Mediastinal hilar contours are stable the trachea is midline.  There is slight pulmonary vascular congestion. Faint, patchy airspace opacities in the lung bases, left greater than right, are similar to the chest radiograph of 07/23/2011.  On the lateral view, a very small unilateral pleural effusion is seen. The bony thorax is unremarkable.  IMPRESSION:  1.  Faint patchy bibasilar airspace opacities, left greater than right without significant change. 2.  Very small unilateral pleural effusion, seen on the lateral view.  Original Report Authenticated By: Britta Mccreedy, M.D.   Ct Head Wo Contrast  07/19/2011  *RADIOLOGY REPORT*  Clinical Data: Altered mental status.  CT HEAD WITHOUT CONTRAST  Technique:  Contiguous axial images were obtained from the base of the skull through the vertex without contrast.  Comparison: Head CT 03/29/2007.  Findings: The ventricles are normal.  No extra-axial fluid  collections are seen.  The brainstem and cerebellum are unremarkable.  No acute intracranial findings such as infarction or hemorrhage.  No mass lesions.  The bony calvarium is intact.  The visualized paranasal sinuses and mastoid air cells are clear.  IMPRESSION: No acute intracranial findings or mass lesions.  No change since prior examination.  Original Report Authenticated By: P. Loralie Champagne, M.D.   Dg Chest Port 1 View  07/23/2011  *RADIOLOGY REPORT*  Clinical Data: Respiratory failure  PORTABLE CHEST - 1 VIEW  Comparison: 07/22/2011  Findings:  Heart size is normal.  There is no pleural effusion identified. Bilateral lower lobe airspace opacities are again  noted.  Compared with previous exam there is improved aeration to the right base.  IMPRESSION:  1.  Interval improvement in aeration to the right base. 2.  Persistent left base opacity.  Original Report Authenticated By: Rosealee Albee, M.D.   Dg Chest Port 1 View  07/21/2011  *RADIOLOGY REPORT*  Clinical Data: Fever, tachycardia  PORTABLE CHEST - 1 VIEW  Comparison: Portable chest x-ray of 07/20/2011  Findings: The lungs are not well aerated.  Moderate cardiomegaly is stable and there may be mild pulmonary vascular congestion present. No focal infiltrate or effusion is seen.  No bony abnormality is noted.  IMPRESSION: Stable cardiomegaly.  Question mild pulmonary vascular congestion.  Original Report Authenticated By: Juline Patch, M.D.   Dg Chest Port 1 View  07/20/2011  *RADIOLOGY REPORT*  Clinical Data: Assess endotracheal tube.  PORTABLE CHEST - 1 VIEW  Comparison: 07/19/2011.  Findings: Endotracheal tube tip is not visualized on present exam.  Cardiomegaly.  Pulmonary vascular congestion.  No segmental consolidation.  No gross pneumothorax.  IMPRESSION: Endotracheal tube tip is not visualized on the present exam.  Cardiomegaly.  Pulmonary vascular congestion.  Original Report Authenticated By: Fuller Canada, M.D.   Dg Chest Port 1  View  07/19/2011  *RADIOLOGY REPORT*  Clinical Data: Chest pain.  Altered mental status.  PORTABLE CHEST - 1 VIEW  Comparison: Portable chest 01/10/2010.  Findings: The heart is mildly enlarged, as before.  The lung volumes have decreased.  Mild pulmonary vascular congestion is now present.  Mild bibasilar atelectasis is noted.  The right IJ line has been removed.  IMPRESSION:  1.  Borderline cardiomegaly and mild pulmonary vascular congestion. 2.  Low lung volumes. 3.  Mild bibasilar atelectasis without other focal airspace disease.  Original Report Authenticated By: Jamesetta Orleans. MATTERN, M.D.   Dg Chest Port 1v Same Day  07/22/2011  *RADIOLOGY REPORT*  Clinical Data: Respiratory failure  PORTABLE CHEST - 1 VIEW SAME DAY  Comparison: 07/21/2011  Findings: Heart size appears normal.  No pleural effusion noted.  Streaky bibasilar opacities are unchanged from previous exam.  No focal airspace consolidation identified.  IMPRESSION:  1.  No change in aeration to the lung bases compared with prior exam.  Original Report Authenticated By: Rosealee Albee, M.D.   Dg Chest Port 1v Same Day  07/19/2011  *RADIOLOGY REPORT*  Clinical Data: Aspiration.  PORTABLE CHEST - 1 VIEW SAME DAY  Comparison: Earlier film, same date.  Findings: The heart is borderline enlarged but stable.  There is mild vascular congestion without overt pulmonary edema.  Persistent bibasilar atelectasis.  No definite infiltrates or effusions.  IMPRESSION: Stable chest x-ray.  Original Report Authenticated By: P. Loralie Champagne, M.D.    Medications: Scheduled Meds:    . antiseptic oral rinse  15 mL Mouth Rinse q12n4p  . chlorhexidine  15 mL Mouth Rinse BID  . Fluticasone-Salmeterol  1 puff Inhalation BID  . furosemide  80 mg Oral Daily  . imipenem-cilastatin  250 mg Intravenous Q6H  . insulin aspart  0-9 Units Subcutaneous Q4H  . insulin aspart  3 Units Subcutaneous TID WC  . insulin glargine  30 Units Subcutaneous QHS  . pantoprazole   40 mg Oral Q1200  . phosphorus  250 mg Oral TID  . DISCONTD: insulin glargine  25 Units Subcutaneous QHS   Continuous Infusions:    Assessment/Plan: Principal Problem:  *DKA (diabetic ketoacidoses): HCO3 stable today, concerned that he can potentially go back in DKA, off  IV insulin   - Blood sugars still uncontrolled, increase Lantus to 35units, increased meal coverage insulin to 4units TID, pt on clears   Active Problems:  Acute respiratory failure: Resolved   Hypoxemia: Likely secondary to pneumonia versus COPD - Continue Advair, imipenem, was given Lasix   Pulmonary edema: Improved, received Lasix IV   Acute renal failure: Improving - Nephrology following  HTN (hypertension):    Pancreatitis, acute: Lipase still elevated, general surgery following, -  on clears, improving   DVT Prophylaxis: SCDs  Code Status: Full code  Disposition: Not medically ready   LOS: 10 days   Emiah Pellicano M.D. [facility] 07/29/2011, 11:42 AM Pager: [phone]

## 2011-07-29 NOTE — Progress Notes (Signed)
[facility]  Subjective:  Awake, alert, taking clear liquids.  Says abd pain better   Objective: Vital signs in last 24 hours: Blood pressure 126/80, pulse 105, temperature 97.4 F (36.3 C), temperature source Axillary, resp. rate 19, height 5\' 8"  (1.727 m), weight 92.1 kg (203 lb 0.7 oz), SpO2 98.00%.    PHYSICAL EXAM General--as above Chest--clear Heart--no rub Abd--no epigastric tenderness, protuberant abdomen, +obesity +/- ascites? Extr--mild LE edema   I/O yesterday 222/1800, today 240/225  Wt today 91.3 kg (83.1 kg on 3 Apr)  Lab Results:   Lab 07/29/11 0411 07/28/11 0820 07/27/11 2017 07/27/11 0319 07/25/11 0630  NA 144 145 141 -- --  K 4.1 3.8 3.6 -- --  CL 109 113* 108 -- --  CO2 22 17* 21 -- --  BUN 26* 27* 26* -- --  CREATININE 2.44* 2.56* 2.67* -- --  ALB -- -- -- -- --  GLUCOSE 273* -- -- -- --  CALCIUM 9.6 8.9 9.1 -- --  PHOS -- 3.7 -- 3.6 1.1*     Basename 07/29/11 0411 07/28/11 0820  WBC 19.8* 18.5*  HGB 8.8* 8.5*  HCT 26.6* 25.2*  PLT 385 322    Assessment/Plan: 1. Acute kidney injury--Cr slightly better. Still appears vol expanded,weights are up.  Will give IV lasix 80 x 2 today.  Repeat UA--3-6 WBC, 3-6 RBC, hyaline casts, 100 mg/dl glu, 409 mg/dl pro, mod hgb, UPEP pending 2. Anemia- Fe/TIBC not done, ferritin 2857--therefore no IV Fe 3. DM with DKA on adm--on insulin 4. Hep C +--no suggestions 5. COPD--per primary service 6. Gall stones--surg is following 7. Pancreatitis--less epigastric pain.       LOS: 10 days   Selso Mannor D 07/29/2011,3:35 PM   .labalb

## 2011-07-30 LAB — CBC
MCH: 33.1 pg (ref 26.0–34.0)
MCV: 97.2 fL (ref 78.0–100.0)
Platelets: 462 10*3/uL — ABNORMAL HIGH (ref 150–400)
RDW: 13.6 % (ref 11.5–15.5)

## 2011-07-30 LAB — GLUCOSE, CAPILLARY
Glucose-Capillary: 247 mg/dL — ABNORMAL HIGH (ref 70–99)
Glucose-Capillary: 201 mg/dL — ABNORMAL HIGH (ref 70–99)

## 2011-07-30 LAB — RENAL FUNCTION PANEL
BUN: 29 mg/dL — ABNORMAL HIGH (ref 6–23)
Calcium: 9.5 mg/dL (ref 8.4–10.5)
Creatinine, Ser: 2.46 mg/dL — ABNORMAL HIGH (ref 0.50–1.35)
Glucose, Bld: 296 mg/dL — ABNORMAL HIGH (ref 70–99)
Phosphorus: 5.8 mg/dL — ABNORMAL HIGH (ref 2.3–4.6)
Potassium: 3 mEq/L — ABNORMAL LOW (ref 3.5–5.1)

## 2011-07-30 MED ORDER — ONDANSETRON HCL 4 MG/2ML IJ SOLN: 4.0000 mg | Freq: Four times a day (QID) | INTRAMUSCULAR | Status: DC | PRN

## 2011-07-30 MED ORDER — FUROSEMIDE 10 MG/ML IJ SOLN
80.0000 mg | Freq: Once | INTRAMUSCULAR | Status: AC
  Administered 2011-07-30: 80 mg via INTRAVENOUS
  Filled 2011-07-30: qty 8

## 2011-07-30 MED ORDER — POTASSIUM CHLORIDE 10 MEQ/100ML IV SOLN: 10.0000 meq | INTRAVENOUS | Status: DC

## 2011-07-30 MED ORDER — ONDANSETRON HCL 4 MG/2ML IJ SOLN
INTRAMUSCULAR | Status: AC
  Administered 2011-07-30: 4 mg
  Filled 2011-07-30: qty 2

## 2011-07-30 MED ORDER — POTASSIUM CHLORIDE 20 MEQ/15ML (10%) PO LIQD
30.0000 meq | Freq: Four times a day (QID) | ORAL | Status: DC
  Administered 2011-07-30: 18:00:00 via ORAL
  Filled 2011-07-30 (×2): qty 22.5

## 2011-07-30 MED ORDER — PROMETHAZINE HCL 25 MG/ML IJ SOLN
25.0000 mg | INTRAMUSCULAR | Status: DC | PRN
  Administered 2011-07-30: 25 mg via INTRAVENOUS
  Filled 2011-07-30: qty 1

## 2011-07-30 MED ORDER — INSULIN ASPART 100 UNIT/ML ~~LOC~~ SOLN
0.0000 [IU] | SUBCUTANEOUS | Status: DC
  Administered 2011-07-30 (×2): 5 [IU] via SUBCUTANEOUS
  Administered 2011-07-30: 8 [IU] via SUBCUTANEOUS
  Administered 2011-07-31: 3 [IU] via SUBCUTANEOUS
  Administered 2011-07-31: 11 [IU] via SUBCUTANEOUS
  Administered 2011-07-31 (×2): 3 [IU] via SUBCUTANEOUS

## 2011-07-30 MED ORDER — POTASSIUM CHLORIDE 10 MEQ/100ML IV SOLN
10.0000 meq | INTRAVENOUS | Status: AC
  Administered 2011-07-30 (×2): 10 meq via INTRAVENOUS
  Filled 2011-07-30 (×3): qty 100

## 2011-07-30 NOTE — Progress Notes (Signed)
  Subjective: Patient complains of some nausea, some mild upper abdominal pain with taking clear liquids Blood sugars still close to 300.   Objective: Vital signs in last 24 hours: Temp:  [97.4 F (36.3 C)-98.8 F (37.1 C)] 98.3 F (36.8 C) (04/14 0515) Pulse Rate:  [105-115] 110  (04/14 0515) Resp:  [18-19] 18  (04/14 0515) BP: (118-139)/(68-83) 139/83 mmHg (04/14 0515) SpO2:  [95 %-100 %] 95 % (04/14 0515) Weight:  [191 lb 9.3 oz (86.9 kg)] 191 lb 9.3 oz (86.9 kg) (04/13 2022) Last BM Date: 07/29/11  Intake/Output from previous day: 04/13 0701 - 04/14 0700 In: 360 [P.O.:360] Out: 3500 [Urine:3500] Intake/Output this shift:    General appearance: alert, cooperative and no distress GI: hypoactive bowel sounds; mildly distended; mild epigastric tenderness  Lab Results:   Basename 07/29/11 0411 07/28/11 0820  WBC 19.8* 18.5*  HGB 8.8* 8.5*  HCT 26.6* 25.2*  PLT 385 322   BMET  Basename 07/30/11 0640 07/29/11 0411  NA 142 144  K 3.0* 4.1  CL 103 109  CO2 24 22  GLUCOSE 296* 273*  BUN 29* 26*  CREATININE 2.46* 2.44*  CALCIUM 9.5 9.6   PT/INR No results found for this basename: LABPROT:2,INR:2 in the last 72 hours ABG No results found for this basename: PHART:2,PCO2:2,PO2:2,HCO3:2 in the last 72 hours  Studies/Results: No results found.  Anti-infectives: Anti-infectives     Start     Dose/Rate Route Frequency Ordered Stop   07/28/11 1200   imipenem-cilastatin (PRIMAXIN) 250 mg in sodium chloride 0.9 % 100 mL IVPB        250 mg 200 mL/hr over 30 Minutes Intravenous 4 times per day 07/28/11 0937     07/26/11 1000   imipenem-cilastatin (PRIMAXIN) 500 mg in sodium chloride 0.9 % 100 mL IVPB  Status:  Discontinued        500 mg 200 mL/hr over 30 Minutes Intravenous Every 12 hours 07/26/11 0807 07/28/11 0937   07/22/11 1500   vancomycin (VANCOCIN) IVPB 1000 mg/200 mL premix  Status:  Discontinued        1,000 mg 200 mL/hr over 60 Minutes Intravenous Every  8 hours 07/22/11 1414 07/25/11 0848   07/22/11 1415   piperacillin-tazobactam (ZOSYN) IVPB 3.375 g  Status:  Discontinued        3.375 g 12.5 mL/hr over 240 Minutes Intravenous 3 times per day 07/22/11 1414 07/25/11 0848   07/19/11 2100   ciprofloxacin (CIPRO) IVPB 400 mg  Status:  Discontinued        400 mg 200 mL/hr over 60 Minutes Intravenous Every 24 hours 07/19/11 2046 07/20/11 1103   07/19/11 2015   metroNIDAZOLE (FLAGYL) IVPB 500 mg  Status:  Discontinued        500 mg 100 mL/hr over 60 Minutes Intravenous Every 8 hours 07/19/11 2004 07/20/11 1103   07/19/11 2015   ciprofloxacin (CIPRO) IVPB 400 mg  Status:  Discontinued        400 mg 200 mL/hr over 60 Minutes Intravenous Every 8 hours 07/19/11 2004 07/19/11 2046          Assessment/Plan: s/p * No surgery found * Continue antibiotics Will follow along -when medical issues (DKA, high sugars) are under control, we will discuss laparoscopic cholecystectomy Would not advance diet unless patient's symptoms improve.  Continue clears   LOS: 11 days    Merelin Human K. 07/30/2011

## 2011-07-30 NOTE — Progress Notes (Signed)
Patient ID: Ethan Jordan  male  ZOX:[id number]    DOB: 12-Mar-1956    DOA: 07/19/2011  PCP: No primary provider on file.  Subjective: Abdominal pain improving, tolerating clears, blood sugars still uncontrolled  Objective: Weight change: -5.2 kg (-11 lb 7.4 oz)  Intake/Output Summary (Last 24 hours) at 07/30/11 1235 Last data filed at 07/30/11 0900  Gross per 24 hour  Intake    240 ml  Output   3850 ml  Net  -3610 ml   Blood pressure 122/81, pulse 118, temperature 98 F (36.7 C), temperature source Oral, resp. rate 18, height 5\' 8"  (1.727 m), weight 86.9 kg (191 lb 9.3 oz), SpO2 96.00%.  Physical Exam: General: Alert and awake, oriented x3, not in any acute distress. HEENT: anicteric sclera, pupils reactive to light and accommodation, EOMI CVS: S1-S2 clear, no murmur rubs or gallops Chest: clear to auscultation bilaterally, no wheezing, rales or rhonchi Abdomen: Mild tenderness in right upper quadrant, nondistended, normal bowel sounds, no organomegaly Extremities: no cyanosis, clubbing or edema noted bilaterally   Lab Results: Basic Metabolic Panel:  Lab 07/30/11 8119 07/29/11 0411 07/25/11 0630  NA 142 144 --  K 3.0* 4.1 --  CL 103 109 --  CO2 24 22 --  GLUCOSE 296* 273* --  BUN 29* 26* --  CREATININE 2.46* 2.44* --  CALCIUM 9.5 9.6 --  MG -- -- 2.3  PHOS 5.8* -- --   Liver Function Tests:  Lab 07/30/11 0640 07/29/11 0411 07/27/11 0319  AST -- 80* 48*  ALT -- 27 29  ALKPHOS -- 94 85  BILITOT -- 1.1 1.3*  PROT -- 8.3 7.1  ALBUMIN 2.8* 2.5* --    Lab 07/29/11 0411 07/28/11 0820  LIPASE 221* 464*  AMYLASE -- --   No results found for this basename: AMMONIA:2 in the last 168 hours CBC:  Lab 07/29/11 0411 07/28/11 0820  WBC 19.8* 18.5*  NEUTROABS -- 11.2*  HGB 8.8* 8.5*  HCT 26.6* 25.2*  MCV 97.1 96.2  PLT 385 322   Cardiac Enzymes:  Lab 07/27/11 0319  CKTOTAL 178  CKMB --  CKMBINDEX --  TROPONINI --   BNP: No components found with this  basename: POCBNP:2 CBG:  Lab 07/30/11 0753 07/30/11 0453 07/29/11 2359 07/29/11 1658 07/29/11 1136  GLUCAP 247* 288* 245* 223* 314*     Micro Results: Recent Results (from the past 240 hour(s))  CULTURE, BLOOD (ROUTINE X 2)     Status: Normal   Collection Time   07/21/11  8:45 PM      Component Value Range Status Comment   Specimen Description BLOOD LEFT HAND   Final    Special Requests BOTTLES DRAWN AEROBIC ONLY 5CC   Final    Culture  Setup Time [id number]   Final    Culture NO GROWTH 5 DAYS   Final    Report Status 07/28/2011 FINAL   Final   CULTURE, BLOOD (ROUTINE X 2)     Status: Normal   Collection Time   07/21/11  8:55 PM      Component Value Range Status Comment   Specimen Description BLOOD RIGHT ARM   Final    Special Requests BOTTLES DRAWN AEROBIC ONLY 10CC   Final    Culture  Setup Time [id number]   Final    Culture NO GROWTH 5 DAYS   Final    Report Status 07/28/2011 FINAL   Final   URINE CULTURE     Status: Normal   Collection  Time   07/26/11 12:58 PM      Component Value Range Status Comment   Specimen Description URINE, CLEAN CATCH   Final    Special Requests NONE   Final    Culture  Setup Time [id number]   Final    Colony Count NO GROWTH   Final    Culture NO GROWTH   Final    Report Status 07/27/2011 FINAL   Final   CULTURE, BLOOD (ROUTINE X 2)     Status: Normal (Preliminary result)   Collection Time   07/26/11  1:10 PM      Component Value Range Status Comment   Specimen Description BLOOD LEFT ARM   Final    Special Requests BOTTLES DRAWN AEROBIC AND ANAEROBIC 10CC   Final    Culture  Setup Time [id number]   Final    Culture     Final    Value:        BLOOD CULTURE RECEIVED NO GROWTH TO DATE CULTURE WILL BE HELD FOR 5 DAYS BEFORE ISSUING A FINAL NEGATIVE REPORT   Report Status PENDING   Incomplete   CULTURE, BLOOD (ROUTINE X 2)     Status: Normal (Preliminary result)   Collection Time   07/26/11  1:20 PM      Component Value Range Status Comment     Specimen Description BLOOD LEFT HAND   Final    Special Requests BOTTLES DRAWN AEROBIC ONLY 8CC   Final    Culture  Setup Time [id number]   Final    Culture     Final    Value:        BLOOD CULTURE RECEIVED NO GROWTH TO DATE CULTURE WILL BE HELD FOR 5 DAYS BEFORE ISSUING A FINAL NEGATIVE REPORT   Report Status PENDING   Incomplete     Studies/Results: Ct Abdomen Pelvis Wo Contrast  07/26/2011  *RADIOLOGY REPORT*  Clinical Data: History hepatitis C.  Abdominal pain.  Acute renal failure.  Pancreatitis.  CT ABDOMEN AND PELVIS WITHOUT CONTRAST  Technique:  Multidetector CT imaging of the abdomen and pelvis was performed following the standard protocol without intravenous contrast.  Comparison: CT scan 07/20/2011  Findings: There is nodular air space disease in the right lower lobe suggesting pneumonia or aspiration pneumonitis.  No pericardial fluid.  Non-IV contrast images demonstrate no focal hepatic lesion.  There are small gallstones within the gallbladder.  No gallbladder distention.  No evidence of biliary ductal dilatation.  There is interval improvement in the peripancreatic inflammation seen on most recent comparison exam.  The pancreas remains mildly edematous through the head.  There are no organized fluid collections.  The spleen and adrenal glands are normal.  The kidneys are slightly enlarged compared to the exam on 07/20/2011.  No evidence of hydronephrosis.  There is a nonobstructing 1 mm calculus in the mid left kidney.  There is no evidence of ureterolithiasis or obstructive uropathy.  No distal ureteral stones or bladder stones.  The bladder is mildly distended with calculated volume of 180 ml.  The stomach, small bowel, appendix, and cecum are normal.  The colon and rectosigmoid colon are normal.  Abdominal aorta normal caliber.  No retroperitoneal periportal lymphadenopathy.  No free fluid the pelvis.  The prostate gland is normal.  No pelvic lymphadenopathy.  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  No acute osseous findings.  IMPRESSION:  1.  Right lower lobe pneumonia versus aspiration pneumonitis. 2.  No evidence of  obstructive uropathy of the left or right kidney.  The kidneys are enlarged slightly compared to exam 6 days prior. 3.  Nonobstructing tiny calculus within the left kidney. 4.  Bladder is minimally distended. 5.  Improvement in the pancreatic inflammation.  Original Report Authenticated By: Genevive Bi, M.D.   Ct Abdomen Pelvis Wo Contrast  07/20/2011  *RADIOLOGY REPORT*  Clinical Data: Diabetic ketoacidosis.  Abdominal pain.  Possible sepsis.  CT ABDOMEN AND PELVIS WITHOUT CONTRAST  Technique:  Multidetector CT imaging of the abdomen and pelvis was performed following the standard protocol without intravenous contrast.  Comparison: 04/10/2009.  Findings: The lung bases demonstrate a small effusions and bibasilar atelectasis.  The heart is borderline enlarged.  The unenhanced appearance of the liver is unremarkable.  No focal lesions or biliary dilatation.  Small gallstones are noted in the gallbladder.  No distention or inflammation.  The common bile duct is normal in caliber.  The pancreas is mildly enlarged and inflamed with peripancreatic inflammatory changes also involving the second portion of the duodenum and also of the mesocolon.  Findings consistent with acute pancreatitis.  The spleen is normal in size.  No focal lesions.  The adrenal glands and kidneys are normal.  No renal or obstructing ureteral calculi.  The stomach is unremarkable.  The third and fourth portion of the duodenum are normal.  The small bowel is unremarkable.  Dense material is noted the colon.  This is likely ingested material such as Pepto-Bismol.  Mild fibrofatty infiltrative changes involving the colon may suggest a remote inflammatory bowel disease.  No active inflammation is demonstrated.  The appendix is normal. Dense material is noted in the appendix.  The aorta is  normal in caliber.  Mild atherosclerotic changes.  No mesenteric or retroperitoneal masses or adenopathy.  There are small scattered lymph nodes typically in the periportal and celiac axis region.  There is a Foley catheter in the bladder.  The prostate gland and seminal vesicles appear normal.  There is artifact through the lower pelvis due to a right hip prosthesis.  No pelvic mass, adenopathy or free pelvic fluid collections.  No inguinal mass or hernia.  The bony structures are intact.  IMPRESSION:  1.  CT findings consistent with acute pancreatitis. 2.  Cholelithiasis. 3.  No other significant abdominal/pelvic findings. 4.  Small bilateral pleural effusions and bibasilar atelectasis.  Original Report Authenticated By: P. Loralie Champagne, M.D.   Dg Chest 2 View  07/25/2011  *RADIOLOGY REPORT*  Clinical Data: Follow-up pneumonia  CHEST - 2 VIEW  Comparison: Chest radiograph 07/23/2011, and multiple priors dating back to 01/03/2010  Findings: Heart size is within normal limits.  Mediastinal hilar contours are stable the trachea is midline.  There is slight pulmonary vascular congestion. Faint, patchy airspace opacities in the lung bases, left greater than right, are similar to the chest radiograph of 07/23/2011.  On the lateral view, a very small unilateral pleural effusion is seen. The bony thorax is unremarkable.  IMPRESSION:  1.  Faint patchy bibasilar airspace opacities, left greater than right without significant change. 2.  Very small unilateral pleural effusion, seen on the lateral view.  Original Report Authenticated By: Britta Mccreedy, M.D.   Ct Head Wo Contrast  07/19/2011  *RADIOLOGY REPORT*  Clinical Data: Altered mental status.  CT HEAD WITHOUT CONTRAST  Technique:  Contiguous axial images were obtained from the base of the skull through the vertex without contrast.  Comparison: Head CT 03/29/2007.  Findings: The ventricles are normal.  No extra-axial fluid collections are seen.  The brainstem and  cerebellum are unremarkable.  No acute intracranial findings such as infarction or hemorrhage.  No mass lesions.  The bony calvarium is intact.  The visualized paranasal sinuses and mastoid air cells are clear.  IMPRESSION: No acute intracranial findings or mass lesions.  No change since prior examination.  Original Report Authenticated By: P. Loralie Champagne, M.D.   Dg Chest Port 1 View  07/23/2011  *RADIOLOGY REPORT*  Clinical Data: Respiratory failure  PORTABLE CHEST - 1 VIEW  Comparison: 07/22/2011  Findings:  Heart size is normal.  There is no pleural effusion identified. Bilateral lower lobe airspace opacities are again noted.  Compared with previous exam there is improved aeration to the right base.  IMPRESSION:  1.  Interval improvement in aeration to the right base. 2.  Persistent left base opacity.  Original Report Authenticated By: Rosealee Albee, M.D.   Dg Chest Port 1 View  07/21/2011  *RADIOLOGY REPORT*  Clinical Data: Fever, tachycardia  PORTABLE CHEST - 1 VIEW  Comparison: Portable chest x-ray of 07/20/2011  Findings: The lungs are not well aerated.  Moderate cardiomegaly is stable and there may be mild pulmonary vascular congestion present. No focal infiltrate or effusion is seen.  No bony abnormality is noted.  IMPRESSION: Stable cardiomegaly.  Question mild pulmonary vascular congestion.  Original Report Authenticated By: Juline Patch, M.D.   Dg Chest Port 1 View  07/20/2011  *RADIOLOGY REPORT*  Clinical Data: Assess endotracheal tube.  PORTABLE CHEST - 1 VIEW  Comparison: 07/19/2011.  Findings: Endotracheal tube tip is not visualized on present exam.  Cardiomegaly.  Pulmonary vascular congestion.  No segmental consolidation.  No gross pneumothorax.  IMPRESSION: Endotracheal tube tip is not visualized on the present exam.  Cardiomegaly.  Pulmonary vascular congestion.  Original Report Authenticated By: Fuller Canada, M.D.   Dg Chest Port 1 View  07/19/2011  *RADIOLOGY REPORT*  Clinical  Data: Chest pain.  Altered mental status.  PORTABLE CHEST - 1 VIEW  Comparison: Portable chest 01/10/2010.  Findings: The heart is mildly enlarged, as before.  The lung volumes have decreased.  Mild pulmonary vascular congestion is now present.  Mild bibasilar atelectasis is noted.  The right IJ line has been removed.  IMPRESSION:  1.  Borderline cardiomegaly and mild pulmonary vascular congestion. 2.  Low lung volumes. 3.  Mild bibasilar atelectasis without other focal airspace disease.  Original Report Authenticated By: Jamesetta Orleans. MATTERN, M.D.   Dg Chest Port 1v Same Day  07/22/2011  *RADIOLOGY REPORT*  Clinical Data: Respiratory failure  PORTABLE CHEST - 1 VIEW SAME DAY  Comparison: 07/21/2011  Findings: Heart size appears normal.  No pleural effusion noted.  Streaky bibasilar opacities are unchanged from previous exam.  No focal airspace consolidation identified.  IMPRESSION:  1.  No change in aeration to the lung bases compared with prior exam.  Original Report Authenticated By: Rosealee Albee, M.D.   Dg Chest Port 1v Same Day  07/19/2011  *RADIOLOGY REPORT*  Clinical Data: Aspiration.  PORTABLE CHEST - 1 VIEW SAME DAY  Comparison: Earlier film, same date.  Findings: The heart is borderline enlarged but stable.  There is mild vascular congestion without overt pulmonary edema.  Persistent bibasilar atelectasis.  No definite infiltrates or effusions.  IMPRESSION: Stable chest x-ray.  Original Report Authenticated By: P. Loralie Champagne, M.D.    Medications: Scheduled Meds:    . antiseptic oral rinse  15 mL Mouth Rinse  [id number]  . chlorhexidine  15 mL Mouth Rinse BID  . Fluticasone-Salmeterol  1 puff Inhalation BID  . furosemide  80 mg Intravenous Q12H  . imipenem-cilastatin  250 mg Intravenous Q6H  . insulin aspart  0-15 Units Subcutaneous Q4H  . insulin aspart  4 Units Subcutaneous TID WC  . insulin glargine  35 Units Subcutaneous QHS  . pantoprazole  40 mg Oral Q1200  . phosphorus  250  mg Oral TID  . DISCONTD: insulin aspart  0-9 Units Subcutaneous Q4H   Continuous Infusions:    Assessment/Plan: Principal Problem:  *DKA (diabetic ketoacidoses): DKA resolved, off IV insulin   - Blood sugars still uncontrolled, increase Lantus to 35units, increased meal coverage insulin to 4units TID, pt on clears  - Placed on moderate sliding scale insulin  Active Problems:  Acute respiratory failure: Resolved   Hypoxemia: Likely secondary to pneumonia versus COPD - Continue Advair, imipenem, was given Lasix   Pulmonary edema: Improved, received Lasix IV   Acute renal failure: Improving - Nephrology following  HTN (hypertension):    Pancreatitis, acute: general surgery following, -  on clears, improving, plans for laparoscopic cholecystectomy per surgery   DVT Prophylaxis: SCDs  Code Status: Full code  Disposition: Not medically ready   LOS: 11 days   Marney Treloar M.D. [facility] 07/30/2011, 12:35 PM Pager: [phone]

## 2011-07-30 NOTE — Progress Notes (Signed)
Patient given zofran for nausea. He was given potassium after taking zofran, but was unable to tolerate the liquid potassium and vomited back up. Md notified and new order received.

## 2011-07-30 NOTE — Progress Notes (Signed)
[facility]  Subjective:  Awake, alert, taking clear liquids.  Says abd pain better   Objective: Vital signs in last 24 hours: Blood pressure 117/59, pulse 132, temperature 97.9 F (36.6 C), temperature source Oral, resp. rate 18, height 5\' 8"  (1.727 m), weight 86.9 kg (191 lb 9.3 oz), SpO2 97.00%.  PHYSICAL EXAM General--as above Chest--clear Heart--no rub Abd--no epigastric tenderness, protuberant abdomen, +obesity Extr--mild LE edema   Lab Results:   Lab 07/30/11 0640 07/29/11 0411 07/28/11 0820 07/27/11 0319  NA 142 144 145 --  K 3.0* 4.1 3.8 --  CL 103 109 113* --  CO2 24 22 17* --  BUN 29* 26* 27* --  CREATININE 2.46* 2.44* 2.56* --  ALB -- -- -- --  GLUCOSE 296* -- -- --  CALCIUM 9.5 9.6 8.9 --  PHOS 5.8* -- 3.7 3.6     Basename 07/29/11 0411 07/28/11 0820  WBC 19.8* 18.5*  HGB 8.8* 8.5*  HCT 26.6* 25.2*  PLT 385 322    Assessment/Plan: 1. Acute kidney injury, associted with acute gallstone pancreatitis and DKA. Suspected ATN, nonoliguric, creatinine levelled off in mid 2's.  Still appears vol expanded,weights are up. Had large diuresis with IV lasix x 2 yesterday (3500 cc). Will give 80 IV lasix again today, one dose. UPEP pending 2. Hypokalemia- replace po 3. Anemia- Fe/TIBC not done, ferritin 2857--therefore no IV Fe 4. DM with DKA on adm--on insulin 5. Hep C +--no suggestions 6. COPD--per primary service 7. Gall stones--surg is following, possible GB removal soon 8. Pancreatitis--less epigastric pain.    LOS: 11 days   Subrena Devereux D 07/30/2011,3:48 PM   .labalb

## 2011-07-31 LAB — HEPATIC FUNCTION PANEL
ALT: 23 U/L (ref 0–53)
AST: 51 U/L — ABNORMAL HIGH (ref 0–37)
Albumin: 2.9 g/dL — ABNORMAL LOW (ref 3.5–5.2)
Alkaline Phosphatase: 103 U/L (ref 39–117)
Total Bilirubin: 1.5 mg/dL — ABNORMAL HIGH (ref 0.3–1.2)

## 2011-07-31 LAB — GLUCOSE, CAPILLARY
Glucose-Capillary: 186 mg/dL — ABNORMAL HIGH (ref 70–99)
Glucose-Capillary: 194 mg/dL — ABNORMAL HIGH (ref 70–99)
Glucose-Capillary: 199 mg/dL — ABNORMAL HIGH (ref 70–99)

## 2011-07-31 LAB — RENAL FUNCTION PANEL
CO2: 26 mEq/L (ref 19–32)
Calcium: 9.8 mg/dL (ref 8.4–10.5)
Creatinine, Ser: 2.46 mg/dL — ABNORMAL HIGH (ref 0.50–1.35)
GFR calc Af Amer: 33 mL/min — ABNORMAL LOW (ref >90)
Glucose, Bld: 209 mg/dL — ABNORMAL HIGH (ref 70–99)
Sodium: 140 mEq/L (ref 135–145)

## 2011-07-31 LAB — LIPASE, BLOOD: Lipase: 191 U/L — ABNORMAL HIGH (ref 11–59)

## 2011-07-31 LAB — PROTEIN ELECTROPHORESIS, SERUM
Alpha-1-Globulin: 8.1 % — ABNORMAL HIGH (ref 2.9–4.9)
Alpha-2-Globulin: 18.3 % — ABNORMAL HIGH (ref 7.1–11.8)
Beta Globulin: 6 % (ref 4.7–7.2)
Gamma Globulin: 23.3 % — ABNORMAL HIGH (ref 11.1–18.8)
M-Spike, %: NOT DETECTED g/dL

## 2011-07-31 MED ORDER — INSULIN GLARGINE 100 UNIT/ML ~~LOC~~ SOLN
40.0000 [IU] | Freq: Every day | SUBCUTANEOUS | Status: DC
  Administered 2011-07-31 – 2011-08-01 (×2): 40 [IU] via SUBCUTANEOUS
  Administered 2011-08-02: 21:00:00 via SUBCUTANEOUS

## 2011-07-31 MED ORDER — INSULIN ASPART 100 UNIT/ML ~~LOC~~ SOLN
0.0000 [IU] | SUBCUTANEOUS | Status: DC
  Administered 2011-07-31: 11 [IU] via SUBCUTANEOUS
  Administered 2011-07-31 – 2011-08-01 (×2): 4 [IU] via SUBCUTANEOUS
  Administered 2011-08-01: 3 [IU] via SUBCUTANEOUS
  Administered 2011-08-01: 4 [IU] via SUBCUTANEOUS
  Administered 2011-08-01: 7 [IU] via SUBCUTANEOUS
  Administered 2011-08-01 – 2011-08-02 (×3): 3 [IU] via SUBCUTANEOUS
  Administered 2011-08-02: 7 [IU] via SUBCUTANEOUS
  Administered 2011-08-02: 11 [IU] via SUBCUTANEOUS
  Administered 2011-08-02 – 2011-08-03 (×3): 3 [IU] via SUBCUTANEOUS
  Administered 2011-08-03: 4 [IU] via SUBCUTANEOUS
  Administered 2011-08-03: 11 [IU] via SUBCUTANEOUS
  Administered 2011-08-03 – 2011-08-04 (×2): 3 [IU] via SUBCUTANEOUS
  Administered 2011-08-04 (×2): 4 [IU] via SUBCUTANEOUS

## 2011-07-31 NOTE — Progress Notes (Signed)
Patient ID: Ethan Jordan  male  WUJ:[id number]    DOB: May 04, 1955    DOA: 07/19/2011  PCP: No primary provider on file.  Subjective: Abdominal pain improving, tolerating clears, sugars very uncontrolled  Objective: Weight change: -3.9 kg (-8 lb 9.6 oz)  Intake/Output Summary (Last 24 hours) at 07/31/11 1657 Last data filed at 07/31/11 1300  Gross per 24 hour  Intake    640 ml  Output   1250 ml  Net   -610 ml   Blood pressure 114/80, pulse 120, temperature 97.8 F (36.6 C), temperature source Oral, resp. rate 18, height 5\' 8"  (1.727 m), weight 83 kg (182 lb 15.7 oz), SpO2 96.00%.  Physical Exam: General: Alert and awake, oriented x3, not in any acute distress. HEENT: anicteric sclera, pupils reactive to light and accommodation, EOMI CVS: S1-S2 clear, no murmur rubs or gallops Chest: clear to auscultation bilaterally, no wheezing, rales or rhonchi Abdomen: Mild tenderness in right upper quadrant, nondistended, normal bowel sounds, no organomegaly Extremities: no cyanosis, clubbing or edema noted bilaterally   Lab Results: Basic Metabolic Panel:  Lab 07/30/11 9562 07/30/11 0640 07/25/11 0630  NA 140 142 --  K 3.5 3.0* --  CL 100 103 --  CO2 26 24 --  GLUCOSE 209* 296* --  BUN 30* 29* --  CREATININE 2.46* 2.46* --  CALCIUM 9.8 9.5 --  MG -- -- 2.3  PHOS 4.6 -- --   Liver Function Tests:  Lab 07/30/11 2259 07/30/11 2258 07/29/11 0411  AST -- 51* 80*  ALT -- 23 27  ALKPHOS -- 103 94  BILITOT -- 1.5* 1.1  PROT -- 9.7* 8.3  ALBUMIN 2.9* 2.9* --    Lab 07/30/11 2258 07/29/11 0411  LIPASE 191* 221*  AMYLASE -- --   No results found for this basename: AMMONIA:2 in the last 168 hours CBC:  Lab 07/30/11 2258 07/29/11 0411 07/28/11 0820  WBC 19.6* 19.8* --  NEUTROABS -- -- 11.2*  HGB 10.5* 8.8* --  HCT 30.8* 26.6* --  MCV 97.2 97.1 --  PLT 462* 385 --   Cardiac Enzymes:  Lab 07/27/11 0319  CKTOTAL 178  CKMB --  CKMBINDEX --  TROPONINI --   BNP: No  components found with this basename: POCBNP:2 CBG:  Lab 07/31/11 1135 07/31/11 0737 07/31/11 0400 07/31/11 0005 07/30/11 2010  GLUCAP 335* 186* 184* 199* 201*     Micro Results: Recent Results (from the past 240 hour(s))  CULTURE, BLOOD (ROUTINE X 2)     Status: Normal   Collection Time   07/21/11  8:45 PM      Component Value Range Status Comment   Specimen Description BLOOD LEFT HAND   Final    Special Requests BOTTLES DRAWN AEROBIC ONLY 5CC   Final    Culture  Setup Time [id number]   Final    Culture NO GROWTH 5 DAYS   Final    Report Status 07/28/2011 FINAL   Final   CULTURE, BLOOD (ROUTINE X 2)     Status: Normal   Collection Time   07/21/11  8:55 PM      Component Value Range Status Comment   Specimen Description BLOOD RIGHT ARM   Final    Special Requests BOTTLES DRAWN AEROBIC ONLY 10CC   Final    Culture  Setup Time [id number]   Final    Culture NO GROWTH 5 DAYS   Final    Report Status 07/28/2011 FINAL   Final   URINE CULTURE  Status: Normal   Collection Time   07/26/11 12:58 PM      Component Value Range Status Comment   Specimen Description URINE, CLEAN CATCH   Final    Special Requests NONE   Final    Culture  Setup Time [id number]   Final    Colony Count NO GROWTH   Final    Culture NO GROWTH   Final    Report Status 07/27/2011 FINAL   Final   CULTURE, BLOOD (ROUTINE X 2)     Status: Normal (Preliminary result)   Collection Time   07/26/11  1:10 PM      Component Value Range Status Comment   Specimen Description BLOOD LEFT ARM   Final    Special Requests BOTTLES DRAWN AEROBIC AND ANAEROBIC 10CC   Final    Culture  Setup Time [id number]   Final    Culture     Final    Value:        BLOOD CULTURE RECEIVED NO GROWTH TO DATE CULTURE WILL BE HELD FOR 5 DAYS BEFORE ISSUING A FINAL NEGATIVE REPORT   Report Status PENDING   Incomplete   CULTURE, BLOOD (ROUTINE X 2)     Status: Normal (Preliminary result)   Collection Time   07/26/11  1:20 PM      Component  Value Range Status Comment   Specimen Description BLOOD LEFT HAND   Final    Special Requests BOTTLES DRAWN AEROBIC ONLY 8CC   Final    Culture  Setup Time [id number]   Final    Culture     Final    Value:        BLOOD CULTURE RECEIVED NO GROWTH TO DATE CULTURE WILL BE HELD FOR 5 DAYS BEFORE ISSUING A FINAL NEGATIVE REPORT   Report Status PENDING   Incomplete     Studies/Results: Ct Abdomen Pelvis Wo Contrast  07/26/2011  *RADIOLOGY REPORT*  Clinical Data: History hepatitis C.  Abdominal pain.  Acute renal failure.  Pancreatitis.  CT ABDOMEN AND PELVIS WITHOUT CONTRAST  Technique:  Multidetector CT imaging of the abdomen and pelvis was performed following the standard protocol without intravenous contrast.  Comparison: CT scan 07/20/2011  Findings: There is nodular air space disease in the right lower lobe suggesting pneumonia or aspiration pneumonitis.  No pericardial fluid.  Non-IV contrast images demonstrate no focal hepatic lesion.  There are small gallstones within the gallbladder.  No gallbladder distention.  No evidence of biliary ductal dilatation.  There is interval improvement in the peripancreatic inflammation seen on most recent comparison exam.  The pancreas remains mildly edematous through the head.  There are no organized fluid collections.  The spleen and adrenal glands are normal.  The kidneys are slightly enlarged compared to the exam on 07/20/2011.  No evidence of hydronephrosis.  There is a nonobstructing 1 mm calculus in the mid left kidney.  There is no evidence of ureterolithiasis or obstructive uropathy.  No distal ureteral stones or bladder stones.  The bladder is mildly distended with calculated volume of 180 ml.  The stomach, small bowel, appendix, and cecum are normal.  The colon and rectosigmoid colon are normal.  Abdominal aorta normal caliber.  No retroperitoneal periportal lymphadenopathy.  No free fluid the pelvis.  The prostate gland is normal.  No pelvic  lymphadenopathy.  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  No acute osseous findings.  IMPRESSION:  1.  Right lower lobe pneumonia versus aspiration pneumonitis. 2.  No evidence of obstructive uropathy of the left or right kidney.  The kidneys are enlarged slightly compared to exam 6 days prior. 3.  Nonobstructing tiny calculus within the left kidney. 4.  Bladder is minimally distended. 5.  Improvement in the pancreatic inflammation.  Original Report Authenticated By: Genevive Bi, M.D.   Ct Abdomen Pelvis Wo Contrast  07/20/2011  *RADIOLOGY REPORT*  Clinical Data: Diabetic ketoacidosis.  Abdominal pain.  Possible sepsis.  CT ABDOMEN AND PELVIS WITHOUT CONTRAST  Technique:  Multidetector CT imaging of the abdomen and pelvis was performed following the standard protocol without intravenous contrast.  Comparison: 04/10/2009.  Findings: The lung bases demonstrate a small effusions and bibasilar atelectasis.  The heart is borderline enlarged.  The unenhanced appearance of the liver is unremarkable.  No focal lesions or biliary dilatation.  Small gallstones are noted in the gallbladder.  No distention or inflammation.  The common bile duct is normal in caliber.  The pancreas is mildly enlarged and inflamed with peripancreatic inflammatory changes also involving the second portion of the duodenum and also of the mesocolon.  Findings consistent with acute pancreatitis.  The spleen is normal in size.  No focal lesions.  The adrenal glands and kidneys are normal.  No renal or obstructing ureteral calculi.  The stomach is unremarkable.  The third and fourth portion of the duodenum are normal.  The small bowel is unremarkable.  Dense material is noted the colon.  This is likely ingested material such as Pepto-Bismol.  Mild fibrofatty infiltrative changes involving the colon may suggest a remote inflammatory bowel disease.  No active inflammation is demonstrated.  The appendix is normal. Dense material is  noted in the appendix.  The aorta is normal in caliber.  Mild atherosclerotic changes.  No mesenteric or retroperitoneal masses or adenopathy.  There are small scattered lymph nodes typically in the periportal and celiac axis region.  There is a Foley catheter in the bladder.  The prostate gland and seminal vesicles appear normal.  There is artifact through the lower pelvis due to a right hip prosthesis.  No pelvic mass, adenopathy or free pelvic fluid collections.  No inguinal mass or hernia.  The bony structures are intact.  IMPRESSION:  1.  CT findings consistent with acute pancreatitis. 2.  Cholelithiasis. 3.  No other significant abdominal/pelvic findings. 4.  Small bilateral pleural effusions and bibasilar atelectasis.  Original Report Authenticated By: P. Loralie Champagne, M.D.   Dg Chest 2 View  07/25/2011  *RADIOLOGY REPORT*  Clinical Data: Follow-up pneumonia  CHEST - 2 VIEW  Comparison: Chest radiograph 07/23/2011, and multiple priors dating back to 01/03/2010  Findings: Heart size is within normal limits.  Mediastinal hilar contours are stable the trachea is midline.  There is slight pulmonary vascular congestion. Faint, patchy airspace opacities in the lung bases, left greater than right, are similar to the chest radiograph of 07/23/2011.  On the lateral view, a very small unilateral pleural effusion is seen. The bony thorax is unremarkable.  IMPRESSION:  1.  Faint patchy bibasilar airspace opacities, left greater than right without significant change. 2.  Very small unilateral pleural effusion, seen on the lateral view.  Original Report Authenticated By: Britta Mccreedy, M.D.   Ct Head Wo Contrast  07/19/2011  *RADIOLOGY REPORT*  Clinical Data: Altered mental status.  CT HEAD WITHOUT CONTRAST  Technique:  Contiguous axial images were obtained from the base of the skull through the vertex without contrast.  Comparison: Head CT 03/29/2007.  Findings: The  ventricles are normal.  No extra-axial fluid  collections are seen.  The brainstem and cerebellum are unremarkable.  No acute intracranial findings such as infarction or hemorrhage.  No mass lesions.  The bony calvarium is intact.  The visualized paranasal sinuses and mastoid air cells are clear.  IMPRESSION: No acute intracranial findings or mass lesions.  No change since prior examination.  Original Report Authenticated By: P. Loralie Champagne, M.D.   Dg Chest Port 1 View  07/23/2011  *RADIOLOGY REPORT*  Clinical Data: Respiratory failure  PORTABLE CHEST - 1 VIEW  Comparison: 07/22/2011  Findings:  Heart size is normal.  There is no pleural effusion identified. Bilateral lower lobe airspace opacities are again noted.  Compared with previous exam there is improved aeration to the right base.  IMPRESSION:  1.  Interval improvement in aeration to the right base. 2.  Persistent left base opacity.  Original Report Authenticated By: Rosealee Albee, M.D.   Dg Chest Port 1 View  07/21/2011  *RADIOLOGY REPORT*  Clinical Data: Fever, tachycardia  PORTABLE CHEST - 1 VIEW  Comparison: Portable chest x-ray of 07/20/2011  Findings: The lungs are not well aerated.  Moderate cardiomegaly is stable and there may be mild pulmonary vascular congestion present. No focal infiltrate or effusion is seen.  No bony abnormality is noted.  IMPRESSION: Stable cardiomegaly.  Question mild pulmonary vascular congestion.  Original Report Authenticated By: Juline Patch, M.D.   Dg Chest Port 1 View  07/20/2011  *RADIOLOGY REPORT*  Clinical Data: Assess endotracheal tube.  PORTABLE CHEST - 1 VIEW  Comparison: 07/19/2011.  Findings: Endotracheal tube tip is not visualized on present exam.  Cardiomegaly.  Pulmonary vascular congestion.  No segmental consolidation.  No gross pneumothorax.  IMPRESSION: Endotracheal tube tip is not visualized on the present exam.  Cardiomegaly.  Pulmonary vascular congestion.  Original Report Authenticated By: Fuller Canada, M.D.   Dg Chest Port 1  View  07/19/2011  *RADIOLOGY REPORT*  Clinical Data: Chest pain.  Altered mental status.  PORTABLE CHEST - 1 VIEW  Comparison: Portable chest 01/10/2010.  Findings: The heart is mildly enlarged, as before.  The lung volumes have decreased.  Mild pulmonary vascular congestion is now present.  Mild bibasilar atelectasis is noted.  The right IJ line has been removed.  IMPRESSION:  1.  Borderline cardiomegaly and mild pulmonary vascular congestion. 2.  Low lung volumes. 3.  Mild bibasilar atelectasis without other focal airspace disease.  Original Report Authenticated By: Jamesetta Orleans. MATTERN, M.D.   Dg Chest Port 1v Same Day  07/22/2011  *RADIOLOGY REPORT*  Clinical Data: Respiratory failure  PORTABLE CHEST - 1 VIEW SAME DAY  Comparison: 07/21/2011  Findings: Heart size appears normal.  No pleural effusion noted.  Streaky bibasilar opacities are unchanged from previous exam.  No focal airspace consolidation identified.  IMPRESSION:  1.  No change in aeration to the lung bases compared with prior exam.  Original Report Authenticated By: Rosealee Albee, M.D.   Dg Chest Port 1v Same Day  07/19/2011  *RADIOLOGY REPORT*  Clinical Data: Aspiration.  PORTABLE CHEST - 1 VIEW SAME DAY  Comparison: Earlier film, same date.  Findings: The heart is borderline enlarged but stable.  There is mild vascular congestion without overt pulmonary edema.  Persistent bibasilar atelectasis.  No definite infiltrates or effusions.  IMPRESSION: Stable chest x-ray.  Original Report Authenticated By: P. Loralie Champagne, M.D.    Medications: Scheduled Meds:    . antiseptic oral rinse  15 mL Mouth Rinse q12n4p  . chlorhexidine  15 mL Mouth Rinse BID  . Fluticasone-Salmeterol  1 puff Inhalation BID  . furosemide  80 mg Intravenous Once  . imipenem-cilastatin  250 mg Intravenous Q6H  . insulin aspart  0-20 Units Subcutaneous Q4H  . insulin aspart  4 Units Subcutaneous TID WC  . insulin glargine  40 Units Subcutaneous QHS  .  ondansetron      . pantoprazole  40 mg Oral Q1200  . phosphorus  250 mg Oral TID  . potassium chloride  10 mEq Intravenous Q1 Hr x 3  . DISCONTD: insulin aspart  0-15 Units Subcutaneous Q4H  . DISCONTD: insulin glargine  35 Units Subcutaneous QHS  . DISCONTD: potassium chloride  10 mEq Intravenous Q1 Hr x 3  . DISCONTD: potassium chloride  30 mEq Oral QID   Continuous Infusions:    Assessment/Plan: Principal Problem:  *DKA (diabetic ketoacidoses): DKA resolved, off IV insulin   - Blood sugars still uncontrolled, increase Lantus to 40units, increased meal coverage insulin to 4units TID, pt on clears  - Placed on resistant sliding scale insulin  Active Problems:  Acute respiratory failure: Resolved   Hypoxemia: Likely secondary to pneumonia versus COPD - Continue Advair, imipenem, was given Lasix   Pulmonary edema: Improved, received Lasix IV   Acute renal failure: Improving - Nephrology following  HTN (hypertension):    Pancreatitis, acute: general surgery following, -  on clears, improving, plans for laparoscopic cholecystectomy per surgery tomorrow  DVT Prophylaxis: SCDs  Code Status: Full code  Disposition: Not medically ready   LOS: 12 days   Samanth Mirkin M.D. [facility] 07/31/2011, 4:57 PM Pager: [phone]

## 2011-07-31 NOTE — Progress Notes (Signed)
Patient ID: Ethan Jordan, male   DOB: September 21, 1955, 56 y.o.   MRN: [id number]    Subjective: Pt feeling much better.  No abdominal pain today.  Tolerating clear liquids  Objective: Vital signs in last 24 hours: Temp:  [97.1 F (36.2 C)-98 F (36.7 C)] 97.4 F (36.3 C) (04/15 0403) Pulse Rate:  [117-132] 117  (04/15 0403) Resp:  [18] 18  (04/15 0403) BP: (103-122)/(59-83) 116/79 mmHg (04/15 0403) SpO2:  [95 %-100 %] 95 % (04/15 0403) Weight:  [182 lb 15.7 oz (83 kg)] 182 lb 15.7 oz (83 kg) (04/15 0500) Last BM Date: 07/29/11  Intake/Output from previous day: 04/14 0701 - 04/15 0700 In: 880 [P.O.:480; IV Piggyback:400] Out: 1300 [Urine:1300] Intake/Output this shift: Total I/O In: -  Out: 800 [Urine:800]  PE: Abd: soft, much less tender, +BS, ND  Lab Results:   [facility] 07/30/11 2258 07/29/11 0411  WBC 19.6* 19.8*  HGB 10.5* 8.8*  HCT 30.8* 26.6*  PLT 462* 385   BMET  Basename 07/30/11 2259 07/30/11 0640  NA 140 142  K 3.5 3.0*  CL 100 103  CO2 26 24  GLUCOSE 209* 296*  BUN 30* 29*  CREATININE 2.46* 2.46*  CALCIUM 9.8 9.5   PT/INR No results found for this basename: LABPROT:2,INR:2 in the last 72 hours CMP     Component Value Date/Time   NA 140 07/30/2011 2259   K 3.5 07/30/2011 2259   CL 100 07/30/2011 2259   CO2 26 07/30/2011 2259   GLUCOSE 209* 07/30/2011 2259   BUN 30* 07/30/2011 2259   CREATININE 2.46* 07/30/2011 2259   CALCIUM 9.8 07/30/2011 2259   PROT 9.7* 07/30/2011 2258   ALBUMIN 2.9* 07/30/2011 2259   AST 51* 07/30/2011 2258   ALT 23 07/30/2011 2258   ALKPHOS 103 07/30/2011 2258   BILITOT 1.5* 07/30/2011 2258   GFRNONAA 28* 07/30/2011 2259   GFRAA 33* 07/30/2011 2259   Lipase     Component Value Date/Time   LIPASE 191* 07/30/2011 2258       Studies/Results: No results found.  Anti-infectives: Anti-infectives     Start     Dose/Rate Route Frequency Ordered Stop   07/28/11 1200   imipenem-cilastatin (PRIMAXIN) 250 mg in sodium chloride 0.9  % 100 mL IVPB        250 mg 200 mL/hr over 30 Minutes Intravenous 4 times per day 07/28/11 0937     07/26/11 1000   imipenem-cilastatin (PRIMAXIN) 500 mg in sodium chloride 0.9 % 100 mL IVPB  Status:  Discontinued        500 mg 200 mL/hr over 30 Minutes Intravenous Every 12 hours 07/26/11 0807 07/28/11 0937   07/22/11 1500   vancomycin (VANCOCIN) IVPB 1000 mg/200 mL premix  Status:  Discontinued        1,000 mg 200 mL/hr over 60 Minutes Intravenous Every 8 hours 07/22/11 1414 07/25/11 0848   07/22/11 1415   piperacillin-tazobactam (ZOSYN) IVPB 3.375 g  Status:  Discontinued        3.375 g 12.5 mL/hr over 240 Minutes Intravenous 3 times per day 07/22/11 1414 07/25/11 0848   07/19/11 2100   ciprofloxacin (CIPRO) IVPB 400 mg  Status:  Discontinued        400 mg 200 mL/hr over 60 Minutes Intravenous Every 24 hours 07/19/11 2046 07/20/11 1103   07/19/11 2015   metroNIDAZOLE (FLAGYL) IVPB 500 mg  Status:  Discontinued        500 mg 100 mL/hr over 60  Minutes Intravenous Every 8 hours 07/19/11 2004 07/20/11 1103   07/19/11 2015   ciprofloxacin (CIPRO) IVPB 400 mg  Status:  Discontinued        400 mg 200 mL/hr over 60 Minutes Intravenous Every 8 hours 07/19/11 2004 07/19/11 2046           Assessment/Plan  1. Gallstone pancreatitis 2. DKA  Plan: 1. Recheck lipase in am, was 190 yesterday.  If continuing to come down and pain stays gone as it is today, then I suspect the patient will be stable for a lap chole tomorrow. 2. NPO after MN.   LOS: 12 days    Kerrilynn Derenzo E 07/31/2011

## 2011-07-31 NOTE — Progress Notes (Signed)
Patient ID: Ethan Jordan, male   DOB: November 08, 1955, 56 y.o.   MRN: [id number] S:no complaints O:BP 119/80  Pulse 87  Temp(Src) 98.6 F (37 C) (Oral)  Resp 18  Ht 5\' 8"  (1.727 m)  Wt 83 kg (182 lb 15.7 oz)  BMI 27.82 kg/m2  SpO2 92%  Intake/Output Summary (Last 24 hours) at 07/31/11 1315 Last data filed at 07/31/11 0900  Gross per 24 hour  Intake    520 ml  Output    800 ml  Net   -280 ml   Weight change: -3.9 kg (-8 lb 9.6 oz) Gen:WD WN AAM in NAD CVS:RRR Resp:CTA JYN:WGNFAO Ext:no edema   Lab 07/30/11 2259 07/30/11 2258 07/30/11 0640 07/29/11 0411 07/28/11 0820 07/27/11 2017 07/27/11 1235 07/27/11 0319 07/26/11 1320 07/25/11 1204 07/25/11 0630  NA 140 -- 142 144 145 141 140 141 -- -- --  K 3.5 -- 3.0* 4.1 3.8 3.6 3.5 2.8* -- -- --  CL 100 -- 103 109 113* 108 107 109 -- -- --  CO2 26 -- 24 22 17* 21 18* 19 -- -- --  GLUCOSE 209* -- 296* 273* 315* 279* 367* 224* -- -- --  BUN 30* -- 29* 26* 27* 26* 27* 25* -- -- --  CREATININE 2.46* -- 2.46* 2.44* 2.56* 2.67* 2.78* 2.83* -- -- --  ALB -- -- -- -- -- -- -- -- -- -- --  CALCIUM 9.8 -- 9.5 9.6 8.9 9.1 8.7 8.8 -- -- --  PHOS 4.6 -- 5.8* -- 3.7 -- -- 3.6 -- -- 1.1*  AST -- 51* -- 80* -- -- -- 48* 57* 84* --  ALT -- 23 -- 27 -- -- -- 29 33 38 --   Liver Function Tests:  Lab 07/30/11 2259 07/30/11 2258 07/30/11 0640 07/29/11 0411 07/27/11 0319  AST -- 51* -- 80* 48*  ALT -- 23 -- 27 29  ALKPHOS -- 103 -- 94 85  BILITOT -- 1.5* -- 1.1 1.3*  PROT -- 9.7* -- 8.3 7.1  ALBUMIN 2.9* 2.9* 2.8* -- --    Lab 07/30/11 2258 07/29/11 0411 07/28/11 0820  LIPASE 191* 221* 464*  AMYLASE -- -- --   No results found for this basename: AMMONIA:3 in the last 168 hours CBC:  Lab 07/30/11 2258 07/29/11 0411 07/28/11 0820 07/27/11 0319 07/26/11 2206 07/26/11 0630  WBC 19.6* 19.8* 18.5* -- -- --  NEUTROABS -- -- 11.2* 12.8* -- --  HGB 10.5* 8.8* 8.5* -- -- --  HCT 30.8* 26.6* 25.2* -- -- --  MCV 97.2 97.1 96.2 -- 94.8 97.1  PLT 462*  385 322 -- -- --   Cardiac Enzymes:  Lab 07/27/11 0319  CKTOTAL 178  CKMB --  CKMBINDEX --  TROPONINI --   CBG:  Lab 07/31/11 1135 07/31/11 0737 07/31/11 0400 07/31/11 0005 07/30/11 2010  GLUCAP 335* 186* 184* 199* 201*    Iron Studies: No results found for this basename: IRON,TIBC,TRANSFERRIN,FERRITIN in the last 72 hours Studies/Results: No results found.    Marland Kitchen antiseptic oral rinse  15 mL Mouth Rinse q12n4p  . chlorhexidine  15 mL Mouth Rinse BID  . Fluticasone-Salmeterol  1 puff Inhalation BID  . furosemide  80 mg Intravenous Once  . imipenem-cilastatin  250 mg Intravenous Q6H  . insulin aspart  0-15 Units Subcutaneous Q4H  . insulin aspart  4 Units Subcutaneous TID WC  . insulin glargine  35 Units Subcutaneous QHS  . ondansetron      . pantoprazole  40  mg Oral Q1200  . phosphorus  250 mg Oral TID  . potassium chloride  10 mEq Intravenous Q1 Hr x 3  . DISCONTD: potassium chloride  10 mEq Intravenous Q1 Hr x 3  . DISCONTD: potassium chloride  30 mEq Oral QID    BMET    Component Value Date/Time   NA 140 07/30/2011 2259   K 3.5 07/30/2011 2259   CL 100 07/30/2011 2259   CO2 26 07/30/2011 2259   GLUCOSE 209* 07/30/2011 2259   BUN 30* 07/30/2011 2259   CREATININE 2.46* 07/30/2011 2259   CALCIUM 9.8 07/30/2011 2259   GFRNONAA 28* 07/30/2011 2259   GFRAA 33* 07/30/2011 2259   CBC    Component Value Date/Time   WBC 19.6* 07/30/2011 2258   RBC 3.17* 07/30/2011 2258   HGB 10.5* 07/30/2011 2258   HCT 30.8* 07/30/2011 2258   PLT 462* 07/30/2011 2258   MCV 97.2 07/30/2011 2258   MCH 33.1 07/30/2011 2258   MCHC 34.1 07/30/2011 2258   RDW 13.6 07/30/2011 2258   LYMPHSABS 5.4* 07/28/2011 0820   MONOABS 1.5* 07/28/2011 0820   EOSABS 0.2 07/28/2011 0820   BASOSABS 0.2* 07/28/2011 0820     Assessment/Plan:  1. Acute kidney injury, associted with acute gallstone pancreatitis and DKA. Suspected ATN, nonoliguric, creatinine levelled off in mid 2's.  2. Volume excess- wt down 3.9 liters  since 4/13 but stable over the last 24 hours.  UPEP pending 3. Hypokalemia- replace po 4. Anemia- Fe/TIBC not done, ferritin 2857--therefore no IV Fe 5. DM with DKA on adm--on insulin 6. Hep C +--no suggestions 7. COPD--per primary service 8. Gall stones--surg is following, possible GB removal tomorrow 9. Pancreatitis--less epigastric pain. 10. Microscopic hematuria- has nonobstructing stone on left  Wallis Vancott A

## 2011-07-31 NOTE — Progress Notes (Signed)
Given report on patient at 2330.  While getting report, patient called out about arm burning for his potassium runs.  Patient crying and asking to stop the IV all together.  Rate was at 40 and I dropped it down to 25 but the patient was still in pain.  Line was flushed and kept at [facility] until antibiotic was hung.  MD called and informed of situation and she stated that it was ok to not give the last run of potassium.  Will continue to monitor the patient.

## 2011-07-31 NOTE — Progress Notes (Signed)
Results for MERION, GRIMALDO (MRN [id number]) as of 07/31/2011 14:19  Ref. Range 07/31/2011 00:05 07/31/2011 04:00 07/31/2011 07:37 07/31/2011 11:35  Glucose-Capillary Latest Range: 70-99 mg/dL 629 (H) 528 (H) 413 (H) 335 (H)    Noted patient to be NPO after MN for possible lap chole in AM.  Still having elevated CBGs despite current insulin regimen.  Poor PO intake at times.  Patient appears to be extremely resistant to insulin.  Recommend the following:  1. Increase Lantus to 40 units QHS  2. Increase SSI to Resistant scale Q4 hours  3. MD- Please also place order for "Consult Diabetes OP Education" so pt may attend follow-up session with Certified Diabetes Educator after d/c at the [facility].  Patient with new onset diabetes this admission.    Diabetes Coordinator has discussed new diagnosis with this patient.  Insulin instruction has been ordered.  RNs to be providing ongoing diabetes survival skills education with patient at bedside.  Will follow. Ambrose Finland RN, MSN, CDE Diabetes Coordinator Inpatient Diabetes Program [phone]

## 2011-08-01 ENCOUNTER — Encounter (HOSPITAL_COMMUNITY)
Admission: EM | Disposition: A | Discharge status: Skilled Nursing Facility | Payer: Self-pay | Source: Ambulatory Visit | Attending: Internal Medicine

## 2011-08-01 ENCOUNTER — Encounter (HOSPITAL_COMMUNITY): Payer: Self-pay | Admitting: Anesthesiology

## 2011-08-01 ENCOUNTER — Inpatient Hospital Stay (HOSPITAL_COMMUNITY): Payer: Medicaid Other | Admitting: Anesthesiology

## 2011-08-01 ENCOUNTER — Inpatient Hospital Stay (HOSPITAL_COMMUNITY): Payer: Medicaid Other

## 2011-08-01 HISTORY — PX: CHOLECYSTECTOMY: SHX55

## 2011-08-01 LAB — UIFE/LIGHT CHAINS/TP QN, 24-HR UR
Albumin, U: DETECTED
Beta, Urine: DETECTED — AB
Free Lambda Lt Chains,Ur: 15.5 mg/dL — ABNORMAL HIGH (ref 0.02–0.67)
Gamma Globulin, Urine: DETECTED — AB

## 2011-08-01 LAB — CULTURE, BLOOD (ROUTINE X 2)
Culture  Setup Time: 201304101821
Culture: NO GROWTH

## 2011-08-01 LAB — PROTIME-INR
INR: 1.26 (ref 0.00–1.49)
Prothrombin Time: 16.1 seconds — ABNORMAL HIGH (ref 11.6–15.2)

## 2011-08-01 LAB — GLUCOSE, CAPILLARY
Glucose-Capillary: 137 mg/dL — ABNORMAL HIGH (ref 70–99)
Glucose-Capillary: 167 mg/dL — ABNORMAL HIGH (ref 70–99)
Glucose-Capillary: 185 mg/dL — ABNORMAL HIGH (ref 70–99)
Glucose-Capillary: 201 mg/dL — ABNORMAL HIGH (ref 70–99)
Glucose-Capillary: 209 mg/dL — ABNORMAL HIGH (ref 70–99)
Glucose-Capillary: 98 mg/dL (ref 70–99)

## 2011-08-01 LAB — RENAL FUNCTION PANEL
BUN: 33 mg/dL — ABNORMAL HIGH (ref 6–23)
CO2: 26 mEq/L (ref 19–32)
Calcium: 9.7 mg/dL (ref 8.4–10.5)
Chloride: 101 mEq/L (ref 96–112)
Creatinine, Ser: 2.22 mg/dL — ABNORMAL HIGH (ref 0.50–1.35)
Glucose, Bld: 162 mg/dL — ABNORMAL HIGH (ref 70–99)

## 2011-08-01 SURGERY — LAPAROSCOPIC CHOLECYSTECTOMY WITH INTRAOPERATIVE CHOLANGIOGRAM
Anesthesia: General | Site: Abdomen | Wound class: Contaminated

## 2011-08-01 MED ORDER — HYDROMORPHONE HCL PF 1 MG/ML IJ SOLN
0.2500 mg | INTRAMUSCULAR | Status: DC | PRN
  Administered 2011-08-01: 0.5 mg via INTRAVENOUS

## 2011-08-01 MED ORDER — NEOSTIGMINE METHYLSULFATE 1 MG/ML IJ SOLN
INTRAMUSCULAR | Status: DC | PRN
  Administered 2011-08-01: 5 mg via INTRAVENOUS

## 2011-08-01 MED ORDER — LACTATED RINGERS IV SOLN
INTRAVENOUS | Status: DC
  Administered 2011-08-01: 13:00:00 via INTRAVENOUS

## 2011-08-01 MED ORDER — POTASSIUM CHLORIDE 20 MEQ/15ML (10%) PO LIQD
40.0000 meq | Freq: Two times a day (BID) | ORAL | Status: AC
  Administered 2011-08-01 – 2011-08-02 (×3): 40 meq via ORAL
  Filled 2011-08-01 (×4): qty 30

## 2011-08-01 MED ORDER — MIDAZOLAM HCL 5 MG/5ML IJ SOLN
INTRAMUSCULAR | Status: DC | PRN
  Administered 2011-08-01: 1 mg via INTRAVENOUS

## 2011-08-01 MED ORDER — BUPIVACAINE-EPINEPHRINE 0.25% -1:200000 IJ SOLN
INTRAMUSCULAR | Status: DC | PRN
  Administered 2011-08-01: 28 mL

## 2011-08-01 MED ORDER — POTASSIUM CHLORIDE 10 MEQ/100ML IV SOLN
10.0000 meq | INTRAVENOUS | Status: DC
  Filled 2011-08-01 (×4): qty 100

## 2011-08-01 MED ORDER — GLUCAGON HCL (RDNA) 1 MG IJ SOLR
INTRAMUSCULAR | Status: AC
  Filled 2011-08-01: qty 1

## 2011-08-01 MED ORDER — SODIUM CHLORIDE 0.9 % IV SOLN
10.0000 mg | INTRAVENOUS | Status: DC | PRN
  Administered 2011-08-01: 15 ug/min via INTRAVENOUS

## 2011-08-01 MED ORDER — PHENYLEPHRINE HCL 10 MG/ML IJ SOLN
INTRAMUSCULAR | Status: DC | PRN
  Administered 2011-08-01 (×9): 80 ug via INTRAVENOUS

## 2011-08-01 MED ORDER — LACTATED RINGERS IV SOLN
INTRAVENOUS | Status: DC | PRN
  Administered 2011-08-01 (×2): via INTRAVENOUS

## 2011-08-01 MED ORDER — LACTATED RINGERS IV SOLN
INTRAVENOUS | Status: DC
  Administered 2011-08-01: 20:00:00 via INTRAVENOUS

## 2011-08-01 MED ORDER — ROCURONIUM BROMIDE 100 MG/10ML IV SOLN
INTRAVENOUS | Status: DC | PRN
  Administered 2011-08-01: 40 mg via INTRAVENOUS
  Administered 2011-08-01: 10 mg via INTRAVENOUS

## 2011-08-01 MED ORDER — GLUCOSE 40 % PO GEL
ORAL | Status: AC
  Administered 2011-08-01: 37.5 g
  Filled 2011-08-01: qty 1

## 2011-08-01 MED ORDER — 0.9 % SODIUM CHLORIDE (POUR BTL) OPTIME
TOPICAL | Status: DC | PRN
  Administered 2011-08-01: 1000 mL

## 2011-08-01 MED ORDER — ONDANSETRON HCL 4 MG/2ML IJ SOLN
INTRAMUSCULAR | Status: DC | PRN
  Administered 2011-08-01: 4 mg via INTRAVENOUS

## 2011-08-01 MED ORDER — SODIUM CHLORIDE 0.9 % IV SOLN
INTRAVENOUS | Status: DC | PRN
  Administered 2011-08-01: 14:00:00

## 2011-08-01 MED ORDER — GLYCOPYRROLATE 0.2 MG/ML IJ SOLN
INTRAMUSCULAR | Status: DC | PRN
  Administered 2011-08-01: .8 mg via INTRAVENOUS

## 2011-08-01 MED ORDER — SODIUM CHLORIDE 0.9 % IR SOLN
Status: DC | PRN
  Administered 2011-08-01: 1000 mL

## 2011-08-01 MED ORDER — MORPHINE SULFATE 2 MG/ML IJ SOLN
1.0000 mg | INTRAMUSCULAR | Status: DC | PRN
  Administered 2011-08-01: 2 mg via INTRAVENOUS
  Filled 2011-08-01: qty 1

## 2011-08-01 MED ORDER — PROPOFOL 10 MG/ML IV EMUL
INTRAVENOUS | Status: DC | PRN
  Administered 2011-08-01: 50 mg via INTRAVENOUS

## 2011-08-01 MED ORDER — FENTANYL CITRATE 0.05 MG/ML IJ SOLN
INTRAMUSCULAR | Status: DC | PRN
  Administered 2011-08-01: 150 ug via INTRAVENOUS

## 2011-08-01 SURGICAL SUPPLY — 44 items
APPLIER CLIP ROT 10 11.4 M/L (STAPLE) ×2
BLADE SURG ROTATE 9660 (MISCELLANEOUS) ×2 IMPLANT
CANISTER SUCTION 2500CC (MISCELLANEOUS) ×2 IMPLANT
CATH REDDICK CHOLANGI 4FR 50CM (CATHETERS) ×2 IMPLANT
CHLORAPREP W/TINT 26ML (MISCELLANEOUS) ×2 IMPLANT
CLIP APPLIE ROT 10 11.4 M/L (STAPLE) ×1 IMPLANT
CLOTH BEACON ORANGE TIMEOUT ST (SAFETY) ×2 IMPLANT
COVER MAYO STAND STRL (DRAPES) ×2 IMPLANT
COVER SURGICAL LIGHT HANDLE (MISCELLANEOUS) ×2 IMPLANT
DECANTER SPIKE VIAL GLASS SM (MISCELLANEOUS) ×2 IMPLANT
DERMABOND ADVANCED (GAUZE/BANDAGES/DRESSINGS) ×1
DERMABOND ADVANCED .7 DNX12 (GAUZE/BANDAGES/DRESSINGS) ×1 IMPLANT
DRAPE C-ARM 42X72 X-RAY (DRAPES) ×2 IMPLANT
DRAPE UTILITY 15X26 W/TAPE STR (DRAPE) ×4 IMPLANT
ELECT REM PT RETURN 9FT ADLT (ELECTROSURGICAL) ×2
ELECTRODE REM PT RTRN 9FT ADLT (ELECTROSURGICAL) ×1 IMPLANT
GLOVE BIO SURGEON STRL SZ7.5 (GLOVE) ×2 IMPLANT
GLOVE BIOGEL PI IND STRL 7.0 (GLOVE) ×1 IMPLANT
GLOVE BIOGEL PI IND STRL 7.5 (GLOVE) ×1 IMPLANT
GLOVE BIOGEL PI IND STRL 8 (GLOVE) ×2 IMPLANT
GLOVE BIOGEL PI INDICATOR 7.0 (GLOVE) ×1
GLOVE BIOGEL PI INDICATOR 7.5 (GLOVE) ×1
GLOVE BIOGEL PI INDICATOR 8 (GLOVE) ×2
GLOVE ECLIPSE 6.5 STRL STRAW (GLOVE) ×2 IMPLANT
GLOVE ECLIPSE 7.0 STRL STRAW (GLOVE) ×2 IMPLANT
GLOVE ECLIPSE 7.5 STRL STRAW (GLOVE) ×2 IMPLANT
GOWN STRL NON-REIN LRG LVL3 (GOWN DISPOSABLE) ×8 IMPLANT
IV CATH 14GX2 1/4 (CATHETERS) ×2 IMPLANT
KIT BASIN OR (CUSTOM PROCEDURE TRAY) ×2 IMPLANT
KIT ROOM TURNOVER OR (KITS) ×2 IMPLANT
NS IRRIG 1000ML POUR BTL (IV SOLUTION) ×2 IMPLANT
PAD ARMBOARD 7.5X6 YLW CONV (MISCELLANEOUS) ×2 IMPLANT
POUCH SPECIMEN RETRIEVAL 10MM (ENDOMECHANICALS) ×2 IMPLANT
SCISSORS LAP 5X35 DISP (ENDOMECHANICALS) IMPLANT
SET IRRIG TUBING LAPAROSCOPIC (IRRIGATION / IRRIGATOR) ×2 IMPLANT
SLEEVE ENDOPATH XCEL 5M (ENDOMECHANICALS) ×2 IMPLANT
SPECIMEN JAR SMALL (MISCELLANEOUS) ×2 IMPLANT
SUT MNCRL AB 4-0 PS2 18 (SUTURE) ×4 IMPLANT
TOWEL OR 17X24 6PK STRL BLUE (TOWEL DISPOSABLE) ×2 IMPLANT
TOWEL OR 17X26 10 PK STRL BLUE (TOWEL DISPOSABLE) ×2 IMPLANT
TRAY LAPAROSCOPIC (CUSTOM PROCEDURE TRAY) ×2 IMPLANT
TROCAR XCEL BLUNT TIP 100MML (ENDOMECHANICALS) ×2 IMPLANT
TROCAR XCEL NON-BLD 11X100MML (ENDOMECHANICALS) ×2 IMPLANT
TROCAR XCEL NON-BLD 5MMX100MML (ENDOMECHANICALS) ×2 IMPLANT

## 2011-08-01 NOTE — Progress Notes (Signed)
rec'd report from [facility], assuming care of patient

## 2011-08-01 NOTE — Progress Notes (Signed)
CBG:55 @ 2359  Treatment: 15 GM gel and carb snack  Symptoms: None  Follow-up CBG: Time:0030 CBG Result:61  Possible Reasons for Event: Medication regimen: lantus added to night medications  Comments/MD notified:patient resting fine in no distress. MD notified for Romeo Rabon, Nehemiah Settle

## 2011-08-01 NOTE — Transfer of Care (Signed)
Immediate Anesthesia Transfer of Care Note  Patient: Ethan Jordan  Procedure(s) Performed: Procedure(s) (LRB): LAPAROSCOPIC CHOLECYSTECTOMY WITH INTRAOPERATIVE CHOLANGIOGRAM (N/A)  Patient Location: PACU  Anesthesia Type: General  Level of Consciousness: awake, alert  and patient cooperative  Airway & Oxygen Therapy: Patient Spontanous Breathing and Patient connected to face mask oxygen  Post-op Assessment: Report given to PACU RN  Post vital signs: Reviewed and stable  Complications: No apparent anesthesia complications

## 2011-08-01 NOTE — Progress Notes (Signed)
08/01/2011 Lanell Carpenter SPARKS Case Management Note [phone]  Utilization review completed.  

## 2011-08-01 NOTE — Anesthesia Postprocedure Evaluation (Signed)
  Anesthesia Post-op Note  Patient: Ethan Jordan  Procedure(s) Performed: Procedure(s) (LRB): LAPAROSCOPIC CHOLECYSTECTOMY WITH INTRAOPERATIVE CHOLANGIOGRAM (N/A)  Patient Location: PACU  Anesthesia Type: General  Level of Consciousness: awake, alert  and oriented  Airway and Oxygen Therapy: Patient Spontanous Breathing and Patient connected to nasal cannula oxygen  Post-op Pain: none  Post-op Assessment: Post-op Vital signs reviewed, Patient's Cardiovascular Status Stable, Respiratory Function Stable, Patent Airway, No signs of Nausea or vomiting and Pain level controlled  Post-op Vital Signs: Reviewed and stable  Complications: No apparent anesthesia complications

## 2011-08-01 NOTE — Anesthesia Procedure Notes (Signed)
Procedure Name: Intubation Date/Time: 08/01/2011 1:27 PM Performed by: Glendora Score A Pre-anesthesia Checklist: Patient identified, Emergency Drugs available, Suction available and Patient being monitored Patient Re-evaluated:Patient Re-evaluated prior to inductionOxygen Delivery Method: Circle system utilized Preoxygenation: Pre-oxygenation with 100% oxygen Intubation Type: IV induction Ventilation: Mask ventilation without difficulty and Oral airway inserted - appropriate to patient size Laryngoscope Size: Hyacinth Meeker and 2 Grade View: Grade I Tube type: Oral Tube size: 7.5 mm Number of attempts: 1 Airway Equipment and Method: Stylet and LTA kit utilized Placement Confirmation: ETT inserted through vocal cords under direct vision,  positive ETCO2 and breath sounds checked- equal and bilateral Secured at: 23 cm Tube secured with: Tape Dental Injury: Teeth and Oropharynx as per pre-operative assessment

## 2011-08-01 NOTE — Progress Notes (Signed)
Patient ID: Ethan Jordan, male   DOB: 08-Jul-1955, 56 y.o.   MRN: [id number] S:no complaints O:BP 113/74  Pulse 112  Temp(Src) 98.7 F (37.1 C) (Oral)  Resp 18  Ht 5\' 8"  (1.727 m)  Wt 83 kg (182 lb 15.7 oz)  BMI 27.82 kg/m2  SpO2 94%  Intake/Output Summary (Last 24 hours) at 08/01/11 1039 Last data filed at 07/31/11 1700  Gross per 24 hour  Intake    360 ml  Output   1000 ml  Net   -640 ml   Weight change: 0 kg (0 lb) Gen:WD WN AAM in NAD CVS:RRR Resp:CTA WUJ:[id number] Ext:no edema   Lab 08/01/11 0630 07/30/11 2259 07/30/11 2258 07/30/11 0640 07/29/11 0411 07/28/11 0820 07/27/11 2017 07/27/11 1235 07/27/11 0319 07/26/11 1320 07/25/11 1204  NA 142 140 -- 142 144 145 141 140 -- -- --  K 2.9* 3.5 -- 3.0* 4.1 3.8 3.6 3.5 -- -- --  CL 101 100 -- 103 109 113* 108 107 -- -- --  CO2 26 26 -- 24 22 17* 21 18* -- -- --  GLUCOSE 162* 209* -- 296* 273* 315* 279* 367* -- -- --  BUN 33* 30* -- 29* 26* 27* 26* 27* -- -- --  CREATININE 2.22* 2.46* -- 2.46* 2.44* 2.56* 2.67* 2.78* -- -- --  ALB -- -- -- -- -- -- -- -- -- -- --  CALCIUM 9.7 9.8 -- 9.5 9.6 8.9 9.1 8.7 -- -- --  PHOS 5.1* 4.6 -- 5.8* -- 3.7 -- -- 3.6 -- --  AST -- -- 51* -- 80* -- -- -- 48* 57* 84*  ALT -- -- 23 -- 27 -- -- -- 29 33 38   Liver Function Tests:  Lab 08/01/11 0630 07/30/11 2259 07/30/11 2258 07/29/11 0411 07/27/11 0319  AST -- -- 51* 80* 48*  ALT -- -- 23 27 29   ALKPHOS -- -- 103 94 85  BILITOT -- -- 1.5* 1.1 1.3*  PROT -- -- 9.7* 8.3 7.1  ALBUMIN 2.8* 2.9* 2.9* -- --    Lab 08/01/11 0630 07/30/11 2258 07/29/11 0411  LIPASE 217* 191* 221*  AMYLASE -- -- --   No results found for this basename: AMMONIA:3 in the last 168 hours CBC:  Lab 07/30/11 2258 07/29/11 0411 07/28/11 0820 07/27/11 0319 07/26/11 2206 07/26/11 0630  WBC 19.6* 19.8* 18.5* -- -- --  NEUTROABS -- -- 11.2* 12.8* -- --  HGB 10.5* 8.8* 8.5* -- -- --  HCT 30.8* 26.6* 25.2* -- -- --  MCV 97.2 97.1 96.2 -- 94.8 97.1  PLT 462* 385 322  -- -- --   Cardiac Enzymes:  Lab 07/27/11 0319  CKTOTAL 178  CKMB --  CKMBINDEX --  TROPONINI --   CBG:  Lab 08/01/11 0810 08/01/11 0422 08/01/11 0040 08/01/11 0030 07/31/11 2359  GLUCAP 161* 163* 98 61* 55*    Iron Studies: No results found for this basename: IRON,TIBC,TRANSFERRIN,FERRITIN in the last 72 hours Studies/Results: No results found.    Marland Kitchen antiseptic oral rinse  15 mL Mouth Rinse q12n4p  . chlorhexidine  15 mL Mouth Rinse BID  . dextrose      . Fluticasone-Salmeterol  1 puff Inhalation BID  . glucagon      . imipenem-cilastatin  250 mg Intravenous Q6H  . insulin aspart  0-20 Units Subcutaneous Q4H  . insulin aspart  4 Units Subcutaneous TID WC  . insulin glargine  40 Units Subcutaneous QHS  . pantoprazole  40 mg Oral Q1200  .  phosphorus  250 mg Oral TID  . DISCONTD: insulin aspart  0-15 Units Subcutaneous Q4H  . DISCONTD: insulin glargine  35 Units Subcutaneous QHS    BMET    Component Value Date/Time   NA 142 08/01/2011 0630   K 2.9* 08/01/2011 0630   CL 101 08/01/2011 0630   CO2 26 08/01/2011 0630   GLUCOSE 162* 08/01/2011 0630   BUN 33* 08/01/2011 0630   CREATININE 2.22* 08/01/2011 0630   CALCIUM 9.7 08/01/2011 0630   GFRNONAA 32* 08/01/2011 0630   GFRAA 37* 08/01/2011 0630   CBC    Component Value Date/Time   WBC 19.6* 07/30/2011 2258   RBC 3.17* 07/30/2011 2258   HGB 10.5* 07/30/2011 2258   HCT 30.8* 07/30/2011 2258   PLT 462* 07/30/2011 2258   MCV 97.2 07/30/2011 2258   MCH 33.1 07/30/2011 2258   MCHC 34.1 07/30/2011 2258   RDW 13.6 07/30/2011 2258   LYMPHSABS 5.4* 07/28/2011 0820   MONOABS 1.5* 07/28/2011 0820   EOSABS 0.2 07/28/2011 0820   BASOSABS 0.2* 07/28/2011 0820     Assessment/Plan:  1. Acute kidney injury, associted with acute gallstone pancreatitis and DKA. Suspected ATN, nonoliguric, creatinine trending down.   UPEP pending.  Cont to follow 2. Volume excess- wt down 3.9 liters since 4/13 but stable over the last 24 hours. 3. Hypokalemia-  replace po 4. Anemia- Fe/TIBC not done, ferritin 2857--therefore no IV Fe 5. DM with DKA on adm--on insulin 6. Hep C +--no suggestions 7. COPD--per primary service 8. Gall stones--surg is following, possible GB removal today 9. Pancreatitis--less epigastric pain. 10. Microscopic hematuria- has nonobstructing stone on left 11.   Mallory Enriques A

## 2011-08-01 NOTE — Progress Notes (Signed)
Patient ID: Ethan Jordan  male  EAV:[id number]    DOB: [birth year]    DOA: 07/19/2011  PCP: No primary provider on file.  Subjective: Seen earlier today a.m., was waiting for cholecystectomy   Objective: Weight change: 0 kg (0 lb)  Intake/Output Summary (Last 24 hours) at 08/01/11 1728 Last data filed at 08/01/11 1500  Gross per 24 hour  Intake   1520 ml  Output    700 ml  Net    820 ml   Blood pressure 114/80, pulse 103, temperature 98 F (36.7 C), temperature source Oral, resp. rate 17, height 5\' 8"  (1.727 m), weight 83 kg (182 lb 15.7 oz), SpO2 100.00%.  Physical Exam: General: Alert and awake, oriented x3, not in any acute distress. HEENT: anicteric sclera, pupils reactive to light and accommodation, EOMI CVS: S1-S2 clear, no murmur rubs or gallops Chest: clear to auscultation bilaterally, no wheezing, rales or rhonchi Abdomen: Mild tenderness in right upper quadrant, nondistended, normal bowel sounds, no organomegaly Extremities: no cyanosis, clubbing or edema noted bilaterally   Lab Results: Basic Metabolic Panel:  Lab 08/01/11 7829 07/30/11 2259  NA 142 140  K 2.9* 3.5  CL 101 100  CO2 26 26  GLUCOSE 162* 209*  BUN 33* 30*  CREATININE 2.22* 2.46*  CALCIUM 9.7 9.8  MG -- --  PHOS 5.1* --   Liver Function Tests:  Lab 08/01/11 0630 07/30/11 2259 07/30/11 2258 07/29/11 0411  AST -- -- 51* 80*  ALT -- -- 23 27  ALKPHOS -- -- 103 94  BILITOT -- -- 1.5* 1.1  PROT -- -- 9.7* 8.3  ALBUMIN 2.8* 2.9* -- --    Lab 08/01/11 0630 07/30/11 2258  LIPASE 217* 191*  AMYLASE -- --   No results found for this basename: AMMONIA:2 in the last 168 hours CBC:  Lab 07/30/11 2258 07/29/11 0411 07/28/11 0820  WBC 19.6* 19.8* --  NEUTROABS -- -- 11.2*  HGB 10.5* 8.8* --  HCT 30.8* 26.6* --  MCV 97.2 97.1 --  PLT 462* 385 --   Cardiac Enzymes:  Lab 07/27/11 0319  CKTOTAL 178  CKMB --  CKMBINDEX --  TROPONINI --   BNP: No components found with this basename:  POCBNP:2 CBG:  Lab 08/01/11 1440 08/01/11 1301 08/01/11 1208 08/01/11 0810 08/01/11 0422  GLUCAP 167* 185* 209* 161* 163*     Micro Results: Recent Results (from the past 240 hour(s))  URINE CULTURE     Status: Normal   Collection Time   07/26/11 12:58 PM      Component Value Range Status Comment   Specimen Description URINE, CLEAN CATCH   Final    Special Requests NONE   Final    Culture  Setup Time [id number]   Final    Colony Count NO GROWTH   Final    Culture NO GROWTH   Final    Report Status 07/27/2011 FINAL   Final   CULTURE, BLOOD (ROUTINE X 2)     Status: Normal   Collection Time   07/26/11  1:10 PM      Component Value Range Status Comment   Specimen Description BLOOD LEFT ARM   Final    Special Requests BOTTLES DRAWN AEROBIC AND ANAEROBIC 10CC   Final    Culture  Setup Time [id number]   Final    Culture NO GROWTH 5 DAYS   Final    Report Status 08/01/2011 FINAL   Final   CULTURE, BLOOD (ROUTINE X 2)  Status: Normal   Collection Time   07/26/11  1:20 PM      Component Value Range Status Comment   Specimen Description BLOOD LEFT HAND   Final    Special Requests BOTTLES DRAWN AEROBIC ONLY [facility]   Final    Culture  Setup Time [id number]   Final    Culture NO GROWTH 5 DAYS   Final    Report Status 08/01/2011 FINAL   Final     Studies/Results: Ct Abdomen Pelvis Wo Contrast  07/26/2011  *RADIOLOGY REPORT*  Clinical Data: History hepatitis C.  Abdominal pain.  Acute renal failure.  Pancreatitis.  CT ABDOMEN AND PELVIS WITHOUT CONTRAST  Technique:  Multidetector CT imaging of the abdomen and pelvis was performed following the standard protocol without intravenous contrast.  Comparison: CT scan 07/20/2011  Findings: There is nodular air space disease in the right lower lobe suggesting pneumonia or aspiration pneumonitis.  No pericardial fluid.  Non-IV contrast images demonstrate no focal hepatic lesion.  There are small gallstones within the gallbladder.  No gallbladder  distention.  No evidence of biliary ductal dilatation.  There is interval improvement in the peripancreatic inflammation seen on most recent comparison exam.  The pancreas remains mildly edematous through the head.  There are no organized fluid collections.  The spleen and adrenal glands are normal.  The kidneys are slightly enlarged compared to the exam on 07/20/2011.  No evidence of hydronephrosis.  There is a nonobstructing 1 mm calculus in the mid left kidney.  There is no evidence of ureterolithiasis or obstructive uropathy.  No distal ureteral stones or bladder stones.  The bladder is mildly distended with calculated volume of 180 ml.  The stomach, small bowel, appendix, and cecum are normal.  The colon and rectosigmoid colon are normal.  Abdominal aorta normal caliber.  No retroperitoneal periportal lymphadenopathy.  No free fluid the pelvis.  The prostate gland is normal.  No pelvic lymphadenopathy.  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  No acute osseous findings.  IMPRESSION:  1.  Right lower lobe pneumonia versus aspiration pneumonitis. 2.  No evidence of obstructive uropathy of the left or right kidney.  The kidneys are enlarged slightly compared to exam 6 days prior. 3.  Nonobstructing tiny calculus within the left kidney. 4.  Bladder is minimally distended. 5.  Improvement in the pancreatic inflammation.  Original Report Authenticated By: Genevive Bi, M.D.   Ct Abdomen Pelvis Wo Contrast  07/20/2011  *RADIOLOGY REPORT*  Clinical Data: Diabetic ketoacidosis.  Abdominal pain.  Possible sepsis.  CT ABDOMEN AND PELVIS WITHOUT CONTRAST  Technique:  Multidetector CT imaging of the abdomen and pelvis was performed following the standard protocol without intravenous contrast.  Comparison: 04/10/2009.  Findings: The lung bases demonstrate a small effusions and bibasilar atelectasis.  The heart is borderline enlarged.  The unenhanced appearance of the liver is unremarkable.  No focal  lesions or biliary dilatation.  Small gallstones are noted in the gallbladder.  No distention or inflammation.  The common bile duct is normal in caliber.  The pancreas is mildly enlarged and inflamed with peripancreatic inflammatory changes also involving the second portion of the duodenum and also of the mesocolon.  Findings consistent with acute pancreatitis.  The spleen is normal in size.  No focal lesions.  The adrenal glands and kidneys are normal.  No renal or obstructing ureteral calculi.  The stomach is unremarkable.  The third and fourth portion of the duodenum are normal.  The  small bowel is unremarkable.  Dense material is noted the colon.  This is likely ingested material such as Pepto-Bismol.  Mild fibrofatty infiltrative changes involving the colon may suggest a remote inflammatory bowel disease.  No active inflammation is demonstrated.  The appendix is normal. Dense material is noted in the appendix.  The aorta is normal in caliber.  Mild atherosclerotic changes.  No mesenteric or retroperitoneal masses or adenopathy.  There are small scattered lymph nodes typically in the periportal and celiac axis region.  There is a Foley catheter in the bladder.  The prostate gland and seminal vesicles appear normal.  There is artifact through the lower pelvis due to a right hip prosthesis.  No pelvic mass, adenopathy or free pelvic fluid collections.  No inguinal mass or hernia.  The bony structures are intact.  IMPRESSION:  1.  CT findings consistent with acute pancreatitis. 2.  Cholelithiasis. 3.  No other significant abdominal/pelvic findings. 4.  Small bilateral pleural effusions and bibasilar atelectasis.  Original Report Authenticated By: P. Loralie Champagne, M.D.   Dg Chest 2 View  07/25/2011  *RADIOLOGY REPORT*  Clinical Data: Follow-up pneumonia  CHEST - 2 VIEW  Comparison: Chest radiograph 07/23/2011, and multiple priors dating back to 01/03/2010  Findings: Heart size is within normal limits.   Mediastinal hilar contours are stable the trachea is midline.  There is slight pulmonary vascular congestion. Faint, patchy airspace opacities in the lung bases, left greater than right, are similar to the chest radiograph of 07/23/2011.  On the lateral view, a very small unilateral pleural effusion is seen. The bony thorax is unremarkable.  IMPRESSION:  1.  Faint patchy bibasilar airspace opacities, left greater than right without significant change. 2.  Very small unilateral pleural effusion, seen on the lateral view.  Original Report Authenticated By: Britta Mccreedy, M.D.   Ct Head Wo Contrast  07/19/2011  *RADIOLOGY REPORT*  Clinical Data: Altered mental status.  CT HEAD WITHOUT CONTRAST  Technique:  Contiguous axial images were obtained from the base of the skull through the vertex without contrast.  Comparison: Head CT 03/29/2007.  Findings: The ventricles are normal.  No extra-axial fluid collections are seen.  The brainstem and cerebellum are unremarkable.  No acute intracranial findings such as infarction or hemorrhage.  No mass lesions.  The bony calvarium is intact.  The visualized paranasal sinuses and mastoid air cells are clear.  IMPRESSION: No acute intracranial findings or mass lesions.  No change since prior examination.  Original Report Authenticated By: P. Loralie Champagne, M.D.   Dg Chest Port 1 View  07/23/2011  *RADIOLOGY REPORT*  Clinical Data: Respiratory failure  PORTABLE CHEST - 1 VIEW  Comparison: 07/22/2011  Findings:  Heart size is normal.  There is no pleural effusion identified. Bilateral lower lobe airspace opacities are again noted.  Compared with previous exam there is improved aeration to the right base.  IMPRESSION:  1.  Interval improvement in aeration to the right base. 2.  Persistent left base opacity.  Original Report Authenticated By: Rosealee Albee, M.D.   Dg Chest Port 1 View  07/21/2011  *RADIOLOGY REPORT*  Clinical Data: Fever, tachycardia  PORTABLE CHEST - 1 VIEW   Comparison: Portable chest x-ray of 07/20/2011  Findings: The lungs are not well aerated.  Moderate cardiomegaly is stable and there may be mild pulmonary vascular congestion present. No focal infiltrate or effusion is seen.  No bony abnormality is noted.  IMPRESSION: Stable cardiomegaly.  Question mild pulmonary vascular congestion.  Original Report Authenticated By: Juline Patch, M.D.   Dg Chest Port 1 View  07/20/2011  *RADIOLOGY REPORT*  Clinical Data: Assess endotracheal tube.  PORTABLE CHEST - 1 VIEW  Comparison: 07/19/2011.  Findings: Endotracheal tube tip is not visualized on present exam.  Cardiomegaly.  Pulmonary vascular congestion.  No segmental consolidation.  No gross pneumothorax.  IMPRESSION: Endotracheal tube tip is not visualized on the present exam.  Cardiomegaly.  Pulmonary vascular congestion.  Original Report Authenticated By: Fuller Canada, M.D.   Dg Chest Port 1 View  07/19/2011  *RADIOLOGY REPORT*  Clinical Data: Chest pain.  Altered mental status.  PORTABLE CHEST - 1 VIEW  Comparison: Portable chest 01/10/2010.  Findings: The heart is mildly enlarged, as before.  The lung volumes have decreased.  Mild pulmonary vascular congestion is now present.  Mild bibasilar atelectasis is noted.  The right IJ line has been removed.  IMPRESSION:  1.  Borderline cardiomegaly and mild pulmonary vascular congestion. 2.  Low lung volumes. 3.  Mild bibasilar atelectasis without other focal airspace disease.  Original Report Authenticated By: Jamesetta Orleans. MATTERN, M.D.   Dg Chest Port 1v Same Day  07/22/2011  *RADIOLOGY REPORT*  Clinical Data: Respiratory failure  PORTABLE CHEST - 1 VIEW SAME DAY  Comparison: 07/21/2011  Findings: Heart size appears normal.  No pleural effusion noted.  Streaky bibasilar opacities are unchanged from previous exam.  No focal airspace consolidation identified.  IMPRESSION:  1.  No change in aeration to the lung bases compared with prior exam.  Original Report  Authenticated By: Rosealee Albee, M.D.   Dg Chest Port 1v Same Day  07/19/2011  *RADIOLOGY REPORT*  Clinical Data: Aspiration.  PORTABLE CHEST - 1 VIEW SAME DAY  Comparison: Earlier film, same date.  Findings: The heart is borderline enlarged but stable.  There is mild vascular congestion without overt pulmonary edema.  Persistent bibasilar atelectasis.  No definite infiltrates or effusions.  IMPRESSION: Stable chest x-ray.  Original Report Authenticated By: P. Loralie Champagne, M.D.    Medications: Scheduled Meds:    . antiseptic oral rinse  15 mL Mouth Rinse q12n4p  . chlorhexidine  15 mL Mouth Rinse BID  . dextrose      . Fluticasone-Salmeterol  1 puff Inhalation BID  . glucagon      . imipenem-cilastatin  250 mg Intravenous Q6H  . insulin aspart  0-20 Units Subcutaneous Q4H  . insulin aspart  4 Units Subcutaneous TID WC  . insulin glargine  40 Units Subcutaneous QHS  . pantoprazole  40 mg Oral Q1200  . phosphorus  250 mg Oral TID  . potassium chloride  40 mEq Oral BID  . DISCONTD: potassium chloride  10 mEq Intravenous Q1 Hr x 4   Continuous Infusions:    . lactated ringers 20 mL/hr at 08/01/11 1307     Assessment/Plan: Principal Problem:  *DKA (diabetic ketoacidoses): DKA resolved, off IV insulin   - Blood sugars still uncontrolled, on Lantus 40units, meal coverage insulin 4units TID  -on resistant sliding scale insulin, on n.p.o. status currently  Active Problems:  Acute respiratory failure: Resolved   Hypoxemia: Likely secondary to pneumonia versus COPD - Continue Advair, imipenem, was given Lasix   Pulmonary edema: Improved, received Lasix IV   Acute renal failure: Improving - Nephrology following  HTN (hypertension):    Pancreatitis, acute: general surgery following, laparoscopic cholecystectomy today   DVT Prophylaxis: SCDs  Code Status: Full code  Disposition: Not medically ready  LOS: 13 days   Ethan Jordan M.D. [facility] 08/01/2011,  5:28 PM Pager: [phone]

## 2011-08-01 NOTE — Anesthesia Preprocedure Evaluation (Signed)
Anesthesia Evaluation  Patient identified by MRN, date of birth, ID band Patient awake    Reviewed: Allergy & Precautions, H&P , NPO status , Patient's Chart, lab work & pertinent test results  History of Anesthesia Complications Negative for: history of anesthetic complications  Airway Mallampati: I TM Distance: >3 FB Neck ROM: Full    Dental  (+) Edentulous Upper and Edentulous Lower   Pulmonary COPDCurrent Smoker,  breath sounds clear to auscultation  Pulmonary exam normal       Cardiovascular hypertension, Pt. on medications Rhythm:Regular Rate:Normal  Echo 4/13: normal LVF, EF 55-60%, valves OK   Neuro/Psych    GI/Hepatic GERD-  Medicated and Controlled,(+) Hepatitis -, CRecent gallstone pancreatitis   Endo/Other  Diabetes mellitus- (glu 161), Poorly Controlled, Type 2, Insulin Dependent  Renal/GU ARFRenal disease (resolving, creat 2.22)     Musculoskeletal  (+) Arthritis -,   Abdominal (+) + obese,   Peds  Hematology   Anesthesia Other Findings   Reproductive/Obstetrics                           Anesthesia Physical Anesthesia Plan  ASA: III  Anesthesia Plan: General   Post-op Pain Management:    Induction: Intravenous  Airway Management Planned: Oral ETT  Additional Equipment:   Intra-op Plan:   Post-operative Plan: Extubation in OR  Informed Consent: I have reviewed the patients History and Physical, chart, labs and discussed the procedure including the risks, benefits and alternatives for the proposed anesthesia with the patient or authorized representative who has indicated his/her understanding and acceptance.     Plan Discussed with: CRNA and Surgeon  Anesthesia Plan Comments: (Plan routine monitors, GETA)        Anesthesia Quick Evaluation

## 2011-08-01 NOTE — Op Note (Signed)
07/19/2011 - 08/01/2011  2:28 PM  PATIENT:  Ethan Jordan  56 y.o. male  PRE-OPERATIVE DIAGNOSIS:  gallbladder disease  POST-OPERATIVE DIAGNOSIS:  gallbladder disease  PROCEDURE:  Procedure(s) (LRB): LAPAROSCOPIC CHOLECYSTECTOMY WITH INTRAOPERATIVE CHOLANGIOGRAM (N/A)  SURGEON:  Surgeon(s) and Role:    * Robyne Askew, MD - Primary  PHYSICIAN ASSISTANT:   ASSISTANTS: Barnetta Chapel, PA   ANESTHESIA:   general  EBL:  Total I/O In: 1120 [P.O.:120; I.V.:1000] Out: 700 [Urine:700]  BLOOD ADMINISTERED:none  DRAINS: none   LOCAL MEDICATIONS USED:  MARCAINE     SPECIMEN:  Source of Specimen:  gallbladder  DISPOSITION OF SPECIMEN:  PATHOLOGY  COUNTS:  YES  TOURNIQUET:  * No tourniquets in log *  DICTATION: .Dragon Dictation  Procedure: After informed consent was obtained the patient was brought to the operating room and placed in the supine position on the operating room table. After adequate induction of general anesthesia the patient's abdomen was prepped with ChloraPrep allowed to dry and draped in usual sterile manner. The area below the umbilicus was infiltrated with quarter percent  Marcaine. A small incision was made with a 15 blade knife. The incision was carried down through the subcutaneous tissue bluntly with a hemostat and [employer] retractors. The linea alba was identified. The linea alba was incised with a 15 blade knife and each side was grasped with Coker clamps. The preperitoneal space was then probed with a hemostat until the peritoneum was opened and access was gained to the abdominal cavity. A 0 Vicryl pursestring stitch was placed in the fascia surrounding the opening. A Hassan cannula was then placed through the opening and anchored in place with the previously placed Vicryl purse string stitch. The abdomen was insufflated with carbon dioxide without difficulty. A laparoscope was inserted through the [facility] cannula in the right upper quadrant was inspected.  Next the epigastric region was infiltrated with % Marcaine. A small incision was made with a 15 blade knife. A 10 mm port was placed bluntly through this incision into the abdominal cavity under direct vision. Next 2 sites were chosen laterally on the right side of the abdomen for placement of 5 mm ports. Each of these areas was infiltrated with quarter percent Marcaine. Small stab incisions were made with a 15 blade knife. 5 mm ports were then placed bluntly through these incisions into the abdominal cavity under direct vision without difficulty. A blunt grasper was placed through the lateralmost 5 mm port and used to grasp the dome of the gallbladder and elevated anteriorly and superiorly. Another blunt grasper was placed through the other 5 mm port and used to retract the body and neck of the gallbladder. A dissector was placed through the epigastric port and using the electrocautery the peritoneal reflection at the gallbladder neck was opened. Blunt dissection was then carried out in this area until the gallbladder neck-cystic duct junction was readily identified and a good window was created. A single clip was placed on the gallbladder neck. A small  ductotomy was made just below the clip with laparoscopic scissors. A 14-gauge Angiocath was then placed through the anterior abdominal wall under direct vision. A Reddick cholangiogram catheter was then placed through the Angiocath and flushed. The catheter was then placed in the cystic duct and anchored in place with a clip. A cholangiogram was obtained that showed no filling defects good emptying into the duodenum an adequate length on the cystic duct. The anchoring clip and catheters were then removed from the  patient. 3 clips were placed proximally on the cystic duct and the duct was divided between the 2 sets of clips. Posterior to this the cystic artery was identified and again dissected bluntly in a circumferential manner until a good window  was created. 2  clips were placed proximally and one distally on the artery and the artery was divided between the 2 sets of clips. Next a laparoscopic hook cautery device was used to separate the gallbladder from the liver bed. Prior to completely detaching the gallbladder from the liver bed the liver bed was inspected and several small bleeding points were coagulated with the electrocautery until the area was completely hemostatic. The gallbladder was then detached the rest of it from the liver bed without difficulty. A laparoscopic bag was inserted through the epigastric port. The gallbladder was placed within the bag and the bag was sealed. A laparoscope was then moved to the epigastric port. The gallbladder grasper was placed through the [facility] cannula and used to grasp the opening of the bag. The bag with the gallbladder was then removed with the [facility] cannula through the infraumbilical port without difficulty. The fascial defect was then closed with the previously placed Vicryl pursestring stitch as well as with another figure-of-eight 0 Vicryl stitch. The liver bed was inspected again and found to be hemostatic. The abdomen was irrigated with copious amounts of saline until the effluent was clear. The ports were then removed under direct vision without difficulty and were found to be hemostatic. The gas was allowed to escape. The skin incisions were all closed with interrupted 4-0 Monocryl subcuticular stitches. Dermabond dressings were applied. The patient tolerated the procedure well. At the end of the case all needle sponge and instrument counts were correct. The patient was then awakened and taken to recovery in stable condition   PLAN OF CARE: Admit to inpatient   PATIENT DISPOSITION:  PACU - hemodynamically stable.   Delay start of Pharmacological VTE agent (>24hrs) due to surgical blood loss or risk of bleeding: yes

## 2011-08-01 NOTE — Progress Notes (Signed)
Patient ID: Ethan Jordan, male   DOB: [birth year], 56 y.o.   MRN: [id number]    Subjective: Pt feels well.  No c/o  Objective: Vital signs in last 24 hours: Temp:  [97.8 F (36.6 C)-98.8 F (37.1 C)] 98.7 F (37.1 C) (04/16 0535) Pulse Rate:  [87-120] 112  (04/16 0535) Resp:  [18] 18  (04/16 0535) BP: (110-122)/(74-81) 113/74 mmHg (04/16 0535) SpO2:  [92 %-99 %] 93 % (04/16 0535) Weight:  [182 lb 15.7 oz (83 kg)] 182 lb 15.7 oz (83 kg) (04/15 2004) Last BM Date: 07/31/11  Intake/Output from previous day: 04/15 0701 - 04/16 0700 In: 480 [P.O.:480] Out: 1800 [Urine:1800] Intake/Output this shift:    PE: Abd: soft, NT, ND, +BS  Lab Results:   [facility] 07/30/11 2258  WBC 19.6*  HGB 10.5*  HCT 30.8*  PLT 462*   BMET  Basename 07/30/11 2259 07/30/11 0640  NA 140 142  K 3.5 3.0*  CL 100 103  CO2 26 24  GLUCOSE 209* 296*  BUN 30* 29*  CREATININE 2.46* 2.46*  CALCIUM 9.8 9.5   PT/INR No results found for this basename: LABPROT:2,INR:2 in the last 72 hours CMP     Component Value Date/Time   NA 140 07/30/2011 2259   K 3.5 07/30/2011 2259   CL 100 07/30/2011 2259   CO2 26 07/30/2011 2259   GLUCOSE 209* 07/30/2011 2259   BUN 30* 07/30/2011 2259   CREATININE 2.46* 07/30/2011 2259   CALCIUM 9.8 07/30/2011 2259   PROT 9.7* 07/30/2011 2258   ALBUMIN 2.9* 07/30/2011 2259   AST 51* 07/30/2011 2258   ALT 23 07/30/2011 2258   ALKPHOS 103 07/30/2011 2258   BILITOT 1.5* 07/30/2011 2258   GFRNONAA 28* 07/30/2011 2259   GFRAA 33* 07/30/2011 2259   Lipase     Component Value Date/Time   LIPASE 191* 07/30/2011 2258       Studies/Results: No results found.  Anti-infectives: Anti-infectives     Start     Dose/Rate Route Frequency Ordered Stop   07/28/11 1200   imipenem-cilastatin (PRIMAXIN) 250 mg in sodium chloride 0.9 % 100 mL IVPB        250 mg 200 mL/hr over 30 Minutes Intravenous 4 times per day 07/28/11 0937     07/26/11 1000   imipenem-cilastatin (PRIMAXIN) 500 mg  in sodium chloride 0.9 % 100 mL IVPB  Status:  Discontinued        500 mg 200 mL/hr over 30 Minutes Intravenous Every 12 hours 07/26/11 0807 07/28/11 0937   07/22/11 1500   vancomycin (VANCOCIN) IVPB 1000 mg/200 mL premix  Status:  Discontinued        1,000 mg 200 mL/hr over 60 Minutes Intravenous Every 8 hours 07/22/11 1414 07/25/11 0848   07/22/11 1415   piperacillin-tazobactam (ZOSYN) IVPB 3.375 g  Status:  Discontinued        3.375 g 12.5 mL/hr over 240 Minutes Intravenous 3 times per day 07/22/11 1414 07/25/11 0848   07/19/11 2100   ciprofloxacin (CIPRO) IVPB 400 mg  Status:  Discontinued        400 mg 200 mL/hr over 60 Minutes Intravenous Every 24 hours 07/19/11 2046 07/20/11 1103   07/19/11 2015   metroNIDAZOLE (FLAGYL) IVPB 500 mg  Status:  Discontinued        500 mg 100 mL/hr over 60 Minutes Intravenous Every 8 hours 07/19/11 2004 07/20/11 1103   07/19/11 2015   ciprofloxacin (CIPRO) IVPB 400 mg  Status:  Discontinued        400 mg 200 mL/hr over 60 Minutes Intravenous Every 8 hours 07/19/11 2004 07/19/11 2046           Assessment/Plan  1. Gallstone pancreatitis  Plan: 1. Patient doing well.  Plan for OR today for cholecystectomy.   LOS: 13 days    Shantinique Picazo E 08/01/2011

## 2011-08-02 ENCOUNTER — Encounter (HOSPITAL_COMMUNITY): Payer: Self-pay | Admitting: General Surgery

## 2011-08-02 LAB — GLUCOSE, CAPILLARY
Glucose-Capillary: 136 mg/dL — ABNORMAL HIGH (ref 70–99)
Glucose-Capillary: 283 mg/dL — ABNORMAL HIGH (ref 70–99)
Glucose-Capillary: 80 mg/dL (ref 70–99)

## 2011-08-02 LAB — RENAL FUNCTION PANEL
BUN: 24 mg/dL — ABNORMAL HIGH (ref 6–23)
CO2: 26 mEq/L (ref 19–32)
Calcium: 9 mg/dL (ref 8.4–10.5)
Chloride: 107 mEq/L (ref 96–112)
Creatinine, Ser: 1.84 mg/dL — ABNORMAL HIGH (ref 0.50–1.35)
GFR calc non Af Amer: 40 mL/min — ABNORMAL LOW (ref >90)
Glucose, Bld: 143 mg/dL — ABNORMAL HIGH (ref 70–99)

## 2011-08-02 NOTE — Progress Notes (Signed)
Educated pt on how to give himself insulin injections, he demonstrated to me how to give himself injections with no problems, just needs reinforcement. Plan to continue education tomorrow.

## 2011-08-02 NOTE — Progress Notes (Addendum)
Pt and pt's daughter viewed pt education videos 506 and 510. Lafonda Mosses, NT taught pt how to check his blood sugar using the glucometer.

## 2011-08-02 NOTE — Progress Notes (Signed)
Patient ID: Ethan Jordan, male   DOB: [birth year], 56 y.o.   MRN: [id number] 1 Day Post-Op  Subjective: Pt feels pretty good today.  Minimal pain.  Hungry.  Wanting solid diet.  Objective: Vital signs in last 24 hours: Temp:  [97.9 F (36.6 C)-99.4 F (37.4 C)] 99.4 F (37.4 C) (04/17 0427) Pulse Rate:  [87-114] 114  (04/17 0427) Resp:  [13-21] 18  (04/17 0427) BP: (111-139)/(61-84) 123/68 mmHg (04/17 0427) SpO2:  [92 %-100 %] 100 % (04/17 0427) Weight:  [183 lb 13.8 oz (83.4 kg)] 183 lb 13.8 oz (83.4 kg) (04/16 2001) Last BM Date: 08/01/11  Intake/Output from previous day: 04/16 0701 - 04/17 0700 In: 2431.3 [P.O.:840; I.V.:1591.3] Out: 2200 [Urine:2200] Intake/Output this shift:    PE: Abd: soft, minimally tender, +BS, ND, incisions c/d/i  Lab Results:   Basename 07/30/11 2258  WBC 19.6*  HGB 10.5*  HCT 30.8*  PLT 462*   BMET  Basename 08/01/11 0630 07/30/11 2259  NA 142 140  K 2.9* 3.5  CL 101 100  CO2 26 26  GLUCOSE 162* 209*  BUN 33* 30*  CREATININE 2.22* 2.46*  CALCIUM 9.7 9.8   PT/INR  Basename 08/01/11 0630  LABPROT 16.1*  INR 1.26   CMP     Component Value Date/Time   NA 142 08/01/2011 0630   K 2.9* 08/01/2011 0630   CL 101 08/01/2011 0630   CO2 26 08/01/2011 0630   GLUCOSE 162* 08/01/2011 0630   BUN 33* 08/01/2011 0630   CREATININE 2.22* 08/01/2011 0630   CALCIUM 9.7 08/01/2011 0630   PROT 9.7* 07/30/2011 2258   ALBUMIN 2.8* 08/01/2011 0630   AST 51* 07/30/2011 2258   ALT 23 07/30/2011 2258   ALKPHOS 103 07/30/2011 2258   BILITOT 1.5* 07/30/2011 2258   GFRNONAA 32* 08/01/2011 0630   GFRAA 37* 08/01/2011 0630   Lipase     Component Value Date/Time   LIPASE 217* 08/01/2011 0630       Studies/Results: Dg Cholangiogram Operative  08/01/2011  *RADIOLOGY REPORT*  Clinical Data:   Laparoscopic cholecystectomy  INTRAOPERATIVE CHOLANGIOGRAM  Technique:  Cholangiographic images from the C-arm fluoroscopic device were submitted for interpretation  post-operatively.  Please see the procedural report for the amount of contrast and the fluoroscopy time utilized.  Comparison:  None.  Findings:  There is good opacification of normal caliber common and intrahepatic bile ducts.  No filling defect is seen. Cholecystectomy clips are noted.  Prompt excretion into normal- appearing duodenum is noted.  IMPRESSION: No filling defect or ductal dilatation on intraoperative cholangiogram.  Original Report Authenticated By: Harrel Lemon, M.D.    Anti-infectives: Anti-infectives     Start     Dose/Rate Route Frequency Ordered Stop   07/28/11 1200   imipenem-cilastatin (PRIMAXIN) 250 mg in sodium chloride 0.9 % 100 mL IVPB  Status:  Discontinued        250 mg 200 mL/hr over 30 Minutes Intravenous 4 times per day 07/28/11 0937 08/01/11 1949   07/26/11 1000   imipenem-cilastatin (PRIMAXIN) 500 mg in sodium chloride 0.9 % 100 mL IVPB  Status:  Discontinued        500 mg 200 mL/hr over 30 Minutes Intravenous Every 12 hours 07/26/11 0807 07/28/11 0937   07/22/11 1500   vancomycin (VANCOCIN) IVPB 1000 mg/200 mL premix  Status:  Discontinued        1,000 mg 200 mL/hr over 60 Minutes Intravenous Every 8 hours 07/22/11 1414 07/25/11 0848   07/22/11  1415   piperacillin-tazobactam (ZOSYN) IVPB 3.375 g  Status:  Discontinued        3.375 g 12.5 mL/hr over 240 Minutes Intravenous 3 times per day 07/22/11 1414 07/25/11 0848   07/19/11 2100   ciprofloxacin (CIPRO) IVPB 400 mg  Status:  Discontinued        400 mg 200 mL/hr over 60 Minutes Intravenous Every 24 hours 07/19/11 2046 07/20/11 1103   07/19/11 2015   metroNIDAZOLE (FLAGYL) IVPB 500 mg  Status:  Discontinued        500 mg 100 mL/hr over 60 Minutes Intravenous Every 8 hours 07/19/11 2004 07/20/11 1103   07/19/11 2015   ciprofloxacin (CIPRO) IVPB 400 mg  Status:  Discontinued        400 mg 200 mL/hr over 60 Minutes Intravenous Every 8 hours 07/19/11 2004 07/19/11 2046            Assessment/Plan  1. Gallstone pancreatitis, s/p lap chole  Plan: 1. Advance to regular diet for breakfast.  If he tolerates this he is surgically stable for discharge home today.  He may follow up in our office in 2-3 weeks.   LOS: 14 days    Doloris Servantes E 08/02/2011

## 2011-08-02 NOTE — Discharge Instructions (Signed)
[facility] ______[facility] °LAPAROSCOPIC SURGERY: POST OP INSTRUCTIONS °Always review your discharge instruction sheet given to you by the facility where your surgery was performed. °IF YOU HAVE DISABILITY OR FAMILY LEAVE FORMS, YOU MUST BRING THEM TO THE OFFICE FOR PROCESSING.   °DO NOT GIVE THEM TO YOUR DOCTOR. ° °1. A prescription for pain medication may be given to you upon discharge.  Take your pain medication as prescribed, if needed.  If narcotic pain medicine is not needed, then you may take acetaminophen (Tylenol) or ibuprofen (Advil) as needed. °2. Take your usually prescribed medications unless otherwise directed. °3. If you need a refill on your pain medication, please contact your pharmacy.  They will contact our office to request authorization. Prescriptions will not be filled after 5pm or on week-ends. °4. You should follow a light diet the first few days after arrival home, such as soup and crackers, etc.  Be sure to include lots of fluids daily. °5. Most patients will experience some swelling and bruising in the area of the incisions.  Ice packs will help.  Swelling and bruising can take several days to resolve.  °6. It is common to experience some constipation if taking pain medication after surgery.  Increasing fluid intake and taking a stool softener (such as Colace) will usually help or prevent this problem from occurring.  A mild laxative (Milk of Magnesia or Miralax) should be taken according to package instructions if there are no bowel movements after 48 hours. °7. Unless discharge instructions indicate otherwise, you may remove your bandages 24-48 hours after surgery, and you may shower at that time.  You may have steri-strips (small skin tapes) in place directly over the incision.  These strips should be left on the skin for 7-10 days.  If your surgeon used skin glue on the incision, you may shower in 24 hours.  The glue will flake off over the next 2-3 weeks.  Any sutures or  staples will be removed at the office during your follow-up visit. °8. ACTIVITIES:  You may resume regular (light) daily activities beginning the next day--such as daily self-care, walking, climbing stairs--gradually increasing activities as tolerated.  You may have sexual intercourse when it is comfortable.  Refrain from any heavy lifting or straining until approved by your doctor. °a. You may drive when you are no longer taking prescription pain medication, you can comfortably wear a seatbelt, and you can safely maneuver your car and apply brakes. °b. RETURN TO WORK:  __________________________________________________________ °9. You should see your doctor in the office for a follow-up appointment approximately 2-3 weeks after your surgery.  Make sure that you call for this appointment within a day or two after you arrive home to insure a convenient appointment time. °10. OTHER INSTRUCTIONS: __________________________________________________________________________________________________________________________ __________________________________________________________________________________________________________________________ °WHEN TO CALL YOUR DOCTOR: °1. Fever over 101.0 °2. Inability to urinate °3. Continued bleeding from incision. °4. Increased pain, redness, or drainage from the incision. °5. Increasing abdominal pain ° °The clinic staff is available to answer your questions during regular business hours.  Please don’t hesitate to call and ask to speak to one of the nurses for clinical concerns.  If you have a medical emergency, go to the nearest emergency room or call 911.  A surgeon from [facility] is always on call at the hospital. °[address], [address], [place], [place]  [address] ? [address] [address], [place],    [address] °[phone] ? [phone] ? FAX [phone] °Web site:   [email or web address] °

## 2011-08-02 NOTE — Progress Notes (Signed)
Pt stated that he does not want anymore potassium elixir at all.  He stated that it makes his stomach hurt and he feels like he has to throw up.  Please consider changing to PO pills because he does not want the IV potassium either.  Information was passed along to the am nurse.

## 2011-08-02 NOTE — Progress Notes (Signed)
Patient ID: Ethan Jordan, male   DOB: Sep 04, 1955, 56 y.o.   MRN: [id number] S:no new complaints O:BP 122/68  Pulse 109  Temp(Src) 98.2 F (36.8 C) (Oral)  Resp 18  Ht 5\' 8"  (1.727 m)  Wt 83.4 kg (183 lb 13.8 oz)  BMI 27.96 kg/m2  SpO2 98%  Intake/Output Summary (Last 24 hours) at 08/02/11 1254 Last data filed at 08/02/11 0945  Gross per 24 hour  Intake 2911.33 ml  Output   1725 ml  Net 1186.33 ml   Weight change: 0.4 kg (14.1 oz) Gen:WD WN AAM in NAD CVS:no rub Resp:CTA WUJ:[id number], mildly tender, +BS Ext:no edema   Lab 08/02/11 0610 08/01/11 0630 07/30/11 2259 07/30/11 2258 07/30/11 0640 07/29/11 0411 07/28/11 0820 07/27/11 2017 07/27/11 0319 07/26/11 1320  NA 145 142 140 -- 142 144 145 141 -- --  K 3.2* 2.9* 3.5 -- 3.0* 4.1 3.8 3.6 -- --  CL 107 101 100 -- 103 109 113* 108 -- --  CO2 26 26 26  -- 24 22 17* 21 -- --  GLUCOSE 143* 162* 209* -- 296* 273* 315* 279* -- --  BUN 24* 33* 30* -- 29* 26* 27* 26* -- --  CREATININE 1.84* 2.22* 2.46* -- 2.46* 2.44* 2.56* 2.67* -- --  ALB -- -- -- -- -- -- -- -- -- --  CALCIUM 9.0 9.7 9.8 -- 9.5 9.6 8.9 9.1 -- --  PHOS 3.4 5.1* 4.6 -- 5.8* -- 3.7 -- 3.6 --  AST -- -- -- 51* -- 80* -- -- 48* 57*  ALT -- -- -- 23 -- 27 -- -- 29 33   Liver Function Tests:  Lab 08/02/11 0610 08/01/11 0630 07/30/11 2259 07/30/11 2258 07/29/11 0411 07/27/11 0319  AST -- -- -- 51* 80* 48*  ALT -- -- -- 23 27 29   ALKPHOS -- -- -- 103 94 85  BILITOT -- -- -- 1.5* 1.1 1.3*  PROT -- -- -- 9.7* 8.3 7.1  ALBUMIN 2.3* 2.8* 2.9* -- -- --    Lab 08/01/11 0630 07/30/11 2258 07/29/11 0411  LIPASE 217* 191* 221*  AMYLASE -- -- --   No results found for this basename: AMMONIA:3 in the last 168 hours CBC:  Lab 07/30/11 2258 07/29/11 0411 07/28/11 0820 07/27/11 0319 07/26/11 2206  WBC 19.6* 19.8* 18.5* -- --  NEUTROABS -- -- 11.2* 12.8* --  HGB 10.5* 8.8* 8.5* -- --  HCT 30.8* 26.6* 25.2* -- --  MCV 97.2 97.1 96.2 -- 94.8  PLT 462* 385 322 -- --    Cardiac Enzymes:  Lab 07/27/11 0319  CKTOTAL 178  CKMB --  CKMBINDEX --  TROPONINI --   CBG:  Lab 08/02/11 1202 08/02/11 0750 08/02/11 0418 08/01/11 2354 08/01/11 1959  GLUCAP 283* 149* 136* 201* 137*    Iron Studies: No results found for this basename: IRON,TIBC,TRANSFERRIN,FERRITIN in the last 72 hours Studies/Results: Dg Cholangiogram Operative  08/01/2011  *RADIOLOGY REPORT*  Clinical Data:   Laparoscopic cholecystectomy  INTRAOPERATIVE CHOLANGIOGRAM  Technique:  Cholangiographic images from the C-arm fluoroscopic device were submitted for interpretation post-operatively.  Please see the procedural report for the amount of contrast and the fluoroscopy time utilized.  Comparison:  None.  Findings:  There is good opacification of normal caliber common and intrahepatic bile ducts.  No filling defect is seen. Cholecystectomy clips are noted.  Prompt excretion into normal- appearing duodenum is noted.  IMPRESSION: No filling defect or ductal dilatation on intraoperative cholangiogram.  Original Report Authenticated By: Harrel Lemon,  M.D.      . antiseptic oral rinse  15 mL Mouth Rinse q12n4p  . chlorhexidine  15 mL Mouth Rinse BID  . Fluticasone-Salmeterol  1 puff Inhalation BID  . insulin aspart  0-20 Units Subcutaneous Q4H  . insulin aspart  4 Units Subcutaneous TID WC  . insulin glargine  40 Units Subcutaneous QHS  . pantoprazole  40 mg Oral Q1200  . phosphorus  250 mg Oral TID  . potassium chloride  40 mEq Oral BID  . DISCONTD: imipenem-cilastatin  250 mg Intravenous Q6H    BMET    Component Value Date/Time   NA 145 08/02/2011 0610   K 3.2* 08/02/2011 0610   CL 107 08/02/2011 0610   CO2 26 08/02/2011 0610   GLUCOSE 143* 08/02/2011 0610   BUN 24* 08/02/2011 0610   CREATININE 1.84* 08/02/2011 0610   CALCIUM 9.0 08/02/2011 0610   GFRNONAA 40* 08/02/2011 0610   GFRAA 46* 08/02/2011 0610   CBC    Component Value Date/Time   WBC 19.6* 07/30/2011 2258   RBC 3.17*  07/30/2011 2258   HGB 10.5* 07/30/2011 2258   HCT 30.8* 07/30/2011 2258   PLT 462* 07/30/2011 2258   MCV 97.2 07/30/2011 2258   MCH 33.1 07/30/2011 2258   MCHC 34.1 07/30/2011 2258   RDW 13.6 07/30/2011 2258   LYMPHSABS 5.4* 07/28/2011 0820   MONOABS 1.5* 07/28/2011 0820   EOSABS 0.2 07/28/2011 0820   BASOSABS 0.2* 07/28/2011 0820     Assessment/Plan:  1. Acute kidney injury, associted with acute gallstone pancreatitis and DKA. Suspected ATN, nonoliguric, creatinine trending down. Nothing further to add.  Will sign off. Please call with questions/concerns 2. Volume excess- stable 3. Hypernatremia- increase free water, should be better in am now that he is taking po 4. Hypokalemia- replace po 5. Anemia- Fe/TIBC not done, ferritin 2857--therefore no IV Fe 6. DM with DKA on adm--on insulin 7. Hep C +--no suggestions 8. COPD--per primary service 9. Gall stones--surg is following, possible GB removal today 10. Pancreatitis--less epigastric pain. 11. Microscopic hematuria- has nonobstructing stone on left 12. Follow up with Dr. Caryn Section on May 7th at 3pm at [address] [place], [place] [address] Phillip Sandler A

## 2011-08-02 NOTE — Progress Notes (Signed)
Patient ID: Ethan Jordan  male  JYN:[id number]    DOB: 01-30-1956    DOA: 07/19/2011  PCP: No primary provider on file.  Subjective: Happy to try solid foods today as permitted by surgery service  Objective: Weight change: 0.4 kg (14.1 oz)  Intake/Output Summary (Last 24 hours) at 08/02/11 1206 Last data filed at 08/02/11 0945  Gross per 24 hour  Intake 2911.33 ml  Output   1725 ml  Net 1186.33 ml   Blood pressure 122/68, pulse 109, temperature 98.2 F (36.8 C), temperature source Oral, resp. rate 18, height 5\' 8"  (1.727 m), weight 83.4 kg (183 lb 13.8 oz), SpO2 98.00%.  Physical Exam: General: Alert and awake, oriented x3, not in any acute distress. HEENT: anicteric sclera, pupils reactive to light and accommodation, EOMI CVS: S1-S2 clear, no murmur rubs or gallops Chest: clear to auscultation bilaterally, no wheezing, rales or rhonchi Abdomen: Mild tenderness in right upper quadrant, nondistended, normal bowel sounds, no organomegaly Extremities: no cyanosis, clubbing or edema noted bilaterally   Lab Results: Basic Metabolic Panel:  Lab 08/02/11 8657 08/01/11 0630  NA 145 142  K 3.2* 2.9*  CL 107 101  CO2 26 26  GLUCOSE 143* 162*  BUN 24* 33*  CREATININE 1.84* 2.22*  CALCIUM 9.0 9.7  MG -- --  PHOS 3.4 --   Liver Function Tests:  Lab 08/02/11 0610 08/01/11 0630 07/30/11 2258 07/29/11 0411  AST -- -- 51* 80*  ALT -- -- 23 27  ALKPHOS -- -- 103 94  BILITOT -- -- 1.5* 1.1  PROT -- -- 9.7* 8.3  ALBUMIN 2.3* 2.8* -- --    Lab 08/01/11 0630 07/30/11 2258  LIPASE 217* 191*  AMYLASE -- --   No results found for this basename: AMMONIA:2 in the last 168 hours CBC:  Lab 07/30/11 2258 07/29/11 0411 07/28/11 0820  WBC 19.6* 19.8* --  NEUTROABS -- -- 11.2*  HGB 10.5* 8.8* --  HCT 30.8* 26.6* --  MCV 97.2 97.1 --  PLT 462* 385 --   Cardiac Enzymes:  Lab 07/27/11 0319  CKTOTAL 178  CKMB --  CKMBINDEX --  TROPONINI --   BNP: No components found with  this basename: POCBNP:2 CBG:  Lab 08/02/11 0750 08/02/11 0418 08/01/11 2354 08/01/11 1959 08/01/11 1746  GLUCAP 149* 136* 201* 137* 127*     Micro Results: Recent Results (from the past 240 hour(s))  URINE CULTURE     Status: Normal   Collection Time   07/26/11 12:58 PM      Component Value Range Status Comment   Specimen Description URINE, CLEAN CATCH   Final    Special Requests NONE   Final    Culture  Setup Time [id number]   Final    Colony Count NO GROWTH   Final    Culture NO GROWTH   Final    Report Status 07/27/2011 FINAL   Final   CULTURE, BLOOD (ROUTINE X 2)     Status: Normal   Collection Time   07/26/11  1:10 PM      Component Value Range Status Comment   Specimen Description BLOOD LEFT ARM   Final    Special Requests BOTTLES DRAWN AEROBIC AND ANAEROBIC 10CC   Final    Culture  Setup Time [id number]   Final    Culture NO GROWTH 5 DAYS   Final    Report Status 08/01/2011 FINAL   Final   CULTURE, BLOOD (ROUTINE X 2)     Status:  Normal   Collection Time   07/26/11  1:20 PM      Component Value Range Status Comment   Specimen Description BLOOD LEFT HAND   Final    Special Requests BOTTLES DRAWN AEROBIC ONLY [facility]   Final    Culture  Setup Time [id number]   Final    Culture NO GROWTH 5 DAYS   Final    Report Status 08/01/2011 FINAL   Final     Studies/Results: Ct Abdomen Pelvis Wo Contrast  07/26/2011  *RADIOLOGY REPORT*  Clinical Data: History hepatitis C.  Abdominal pain.  Acute renal failure.  Pancreatitis.  CT ABDOMEN AND PELVIS WITHOUT CONTRAST  Technique:  Multidetector CT imaging of the abdomen and pelvis was performed following the standard protocol without intravenous contrast.  Comparison: CT scan 07/20/2011  Findings: There is nodular air space disease in the right lower lobe suggesting pneumonia or aspiration pneumonitis.  No pericardial fluid.  Non-IV contrast images demonstrate no focal hepatic lesion.  There are small gallstones within the gallbladder.   No gallbladder distention.  No evidence of biliary ductal dilatation.  There is interval improvement in the peripancreatic inflammation seen on most recent comparison exam.  The pancreas remains mildly edematous through the head.  There are no organized fluid collections.  The spleen and adrenal glands are normal.  The kidneys are slightly enlarged compared to the exam on 07/20/2011.  No evidence of hydronephrosis.  There is a nonobstructing 1 mm calculus in the mid left kidney.  There is no evidence of ureterolithiasis or obstructive uropathy.  No distal ureteral stones or bladder stones.  The bladder is mildly distended with calculated volume of 180 ml.  The stomach, small bowel, appendix, and cecum are normal.  The colon and rectosigmoid colon are normal.  Abdominal aorta normal caliber.  No retroperitoneal periportal lymphadenopathy.  No free fluid the pelvis.  The prostate gland is normal.  No pelvic lymphadenopathy.  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  No acute osseous findings.  IMPRESSION:  1.  Right lower lobe pneumonia versus aspiration pneumonitis. 2.  No evidence of obstructive uropathy of the left or right kidney.  The kidneys are enlarged slightly compared to exam 6 days prior. 3.  Nonobstructing tiny calculus within the left kidney. 4.  Bladder is minimally distended. 5.  Improvement in the pancreatic inflammation.  Original Report Authenticated By: Genevive Bi, M.D.   Ct Abdomen Pelvis Wo Contrast  07/20/2011  *RADIOLOGY REPORT*  Clinical Data: Diabetic ketoacidosis.  Abdominal pain.  Possible sepsis.  CT ABDOMEN AND PELVIS WITHOUT CONTRAST  Technique:  Multidetector CT imaging of the abdomen and pelvis was performed following the standard protocol without intravenous contrast.  Comparison: 04/10/2009.  Findings: The lung bases demonstrate a small effusions and bibasilar atelectasis.  The heart is borderline enlarged.  The unenhanced appearance of the liver is unremarkable.   No focal lesions or biliary dilatation.  Small gallstones are noted in the gallbladder.  No distention or inflammation.  The common bile duct is normal in caliber.  The pancreas is mildly enlarged and inflamed with peripancreatic inflammatory changes also involving the second portion of the duodenum and also of the mesocolon.  Findings consistent with acute pancreatitis.  The spleen is normal in size.  No focal lesions.  The adrenal glands and kidneys are normal.  No renal or obstructing ureteral calculi.  The stomach is unremarkable.  The third and fourth portion of the duodenum are normal.  The small  bowel is unremarkable.  Dense material is noted the colon.  This is likely ingested material such as Pepto-Bismol.  Mild fibrofatty infiltrative changes involving the colon may suggest a remote inflammatory bowel disease.  No active inflammation is demonstrated.  The appendix is normal. Dense material is noted in the appendix.  The aorta is normal in caliber.  Mild atherosclerotic changes.  No mesenteric or retroperitoneal masses or adenopathy.  There are small scattered lymph nodes typically in the periportal and celiac axis region.  There is a Foley catheter in the bladder.  The prostate gland and seminal vesicles appear normal.  There is artifact through the lower pelvis due to a right hip prosthesis.  No pelvic mass, adenopathy or free pelvic fluid collections.  No inguinal mass or hernia.  The bony structures are intact.  IMPRESSION:  1.  CT findings consistent with acute pancreatitis. 2.  Cholelithiasis. 3.  No other significant abdominal/pelvic findings. 4.  Small bilateral pleural effusions and bibasilar atelectasis.  Original Report Authenticated By: P. Loralie Champagne, M.D.   Dg Chest 2 View  07/25/2011  *RADIOLOGY REPORT*  Clinical Data: Follow-up pneumonia  CHEST - 2 VIEW  Comparison: Chest radiograph 07/23/2011, and multiple priors dating back to 01/03/2010  Findings: Heart size is within normal limits.   Mediastinal hilar contours are stable the trachea is midline.  There is slight pulmonary vascular congestion. Faint, patchy airspace opacities in the lung bases, left greater than right, are similar to the chest radiograph of 07/23/2011.  On the lateral view, a very small unilateral pleural effusion is seen. The bony thorax is unremarkable.  IMPRESSION:  1.  Faint patchy bibasilar airspace opacities, left greater than right without significant change. 2.  Very small unilateral pleural effusion, seen on the lateral view.  Original Report Authenticated By: Britta Mccreedy, M.D.   Ct Head Wo Contrast  07/19/2011  *RADIOLOGY REPORT*  Clinical Data: Altered mental status.  CT HEAD WITHOUT CONTRAST  Technique:  Contiguous axial images were obtained from the base of the skull through the vertex without contrast.  Comparison: Head CT 03/29/2007.  Findings: The ventricles are normal.  No extra-axial fluid collections are seen.  The brainstem and cerebellum are unremarkable.  No acute intracranial findings such as infarction or hemorrhage.  No mass lesions.  The bony calvarium is intact.  The visualized paranasal sinuses and mastoid air cells are clear.  IMPRESSION: No acute intracranial findings or mass lesions.  No change since prior examination.  Original Report Authenticated By: P. Loralie Champagne, M.D.   Dg Chest Port 1 View  07/23/2011  *RADIOLOGY REPORT*  Clinical Data: Respiratory failure  PORTABLE CHEST - 1 VIEW  Comparison: 07/22/2011  Findings:  Heart size is normal.  There is no pleural effusion identified. Bilateral lower lobe airspace opacities are again noted.  Compared with previous exam there is improved aeration to the right base.  IMPRESSION:  1.  Interval improvement in aeration to the right base. 2.  Persistent left base opacity.  Original Report Authenticated By: Rosealee Albee, M.D.   Dg Chest Port 1 View  07/21/2011  *RADIOLOGY REPORT*  Clinical Data: Fever, tachycardia  PORTABLE CHEST - 1 VIEW   Comparison: Portable chest x-ray of 07/20/2011  Findings: The lungs are not well aerated.  Moderate cardiomegaly is stable and there may be mild pulmonary vascular congestion present. No focal infiltrate or effusion is seen.  No bony abnormality is noted.  IMPRESSION: Stable cardiomegaly.  Question mild pulmonary vascular congestion.  Original Report Authenticated By: Juline Patch, M.D.   Dg Chest Port 1 View  07/20/2011  *RADIOLOGY REPORT*  Clinical Data: Assess endotracheal tube.  PORTABLE CHEST - 1 VIEW  Comparison: 07/19/2011.  Findings: Endotracheal tube tip is not visualized on present exam.  Cardiomegaly.  Pulmonary vascular congestion.  No segmental consolidation.  No gross pneumothorax.  IMPRESSION: Endotracheal tube tip is not visualized on the present exam.  Cardiomegaly.  Pulmonary vascular congestion.  Original Report Authenticated By: Fuller Canada, M.D.   Dg Chest Port 1 View  07/19/2011  *RADIOLOGY REPORT*  Clinical Data: Chest pain.  Altered mental status.  PORTABLE CHEST - 1 VIEW  Comparison: Portable chest 01/10/2010.  Findings: The heart is mildly enlarged, as before.  The lung volumes have decreased.  Mild pulmonary vascular congestion is now present.  Mild bibasilar atelectasis is noted.  The right IJ line has been removed.  IMPRESSION:  1.  Borderline cardiomegaly and mild pulmonary vascular congestion. 2.  Low lung volumes. 3.  Mild bibasilar atelectasis without other focal airspace disease.  Original Report Authenticated By: Jamesetta Orleans. MATTERN, M.D.   Dg Chest Port 1v Same Day  07/22/2011  *RADIOLOGY REPORT*  Clinical Data: Respiratory failure  PORTABLE CHEST - 1 VIEW SAME DAY  Comparison: 07/21/2011  Findings: Heart size appears normal.  No pleural effusion noted.  Streaky bibasilar opacities are unchanged from previous exam.  No focal airspace consolidation identified.  IMPRESSION:  1.  No change in aeration to the lung bases compared with prior exam.  Original Report  Authenticated By: Rosealee Albee, M.D.   Dg Chest Port 1v Same Day  07/19/2011  *RADIOLOGY REPORT*  Clinical Data: Aspiration.  PORTABLE CHEST - 1 VIEW SAME DAY  Comparison: Earlier film, same date.  Findings: The heart is borderline enlarged but stable.  There is mild vascular congestion without overt pulmonary edema.  Persistent bibasilar atelectasis.  No definite infiltrates or effusions.  IMPRESSION: Stable chest x-ray.  Original Report Authenticated By: P. Loralie Champagne, M.D.    Medications: Scheduled Meds:    . antiseptic oral rinse  15 mL Mouth Rinse q12n4p  . chlorhexidine  15 mL Mouth Rinse BID  . Fluticasone-Salmeterol  1 puff Inhalation BID  . glucagon      . insulin aspart  0-20 Units Subcutaneous Q4H  . insulin aspart  4 Units Subcutaneous TID WC  . insulin glargine  40 Units Subcutaneous QHS  . pantoprazole  40 mg Oral Q1200  . phosphorus  250 mg Oral TID  . potassium chloride  40 mEq Oral BID  . DISCONTD: imipenem-cilastatin  250 mg Intravenous Q6H   Continuous Infusions:    . lactated ringers 20 mL/hr at 08/01/11 1307  . lactated ringers 20 mL/hr at 08/02/11 0600     Assessment/Plan: Principal Problem:  *DKA (diabetic ketoacidoses): DKA resolved, off IV insulin   - Blood sugars somewhat better controlled, continue on Lantus 40units, meal coverage insulin 4units TID  - on resistant sliding scale insulin, starting solids today - Discussed with patient, his daughter and the RN to provide insulin teaching today  Pancreatitis, acute: general surgery following - Status post laparoscopic cholecystectomy, start solids today   Active Problems:  Acute respiratory failure: Resolved   Hypoxemia: Likely secondary to pneumonia versus COPD: Continue Advair Diskus, PRN albuterol   Pulmonary edema: Improved, received Lasix IV   Acute renal failure: Improving - Nephrology following  HTN (hypertension): Stable  DVT Prophylaxis: SCDs  Code Status: Full  code  Disposition: Obtain PT OT evaluation today, patient's daughter reported that she works during the day and unable to provide 24/7 supervision. Patient is also new onset diabetic and will need insulin coverage. He may benefit from short-term rehabilitation given difficult hospitalization. Social worker updated, hopefully DC in a.m.   LOS: 14 days   Yahya Boldman M.D. [facility] 08/02/2011, 12:06 PM Pager: [phone]

## 2011-08-03 LAB — RENAL FUNCTION PANEL
Albumin: 2.4 g/dL — ABNORMAL LOW (ref 3.5–5.2)
GFR calc Af Amer: 58 mL/min — ABNORMAL LOW (ref >90)
GFR calc non Af Amer: 50 mL/min — ABNORMAL LOW (ref >90)
Glucose, Bld: 149 mg/dL — ABNORMAL HIGH (ref 70–99)
Phosphorus: 3.5 mg/dL (ref 2.3–4.6)
Potassium: 3.1 mEq/L — ABNORMAL LOW (ref 3.5–5.1)
Sodium: 143 mEq/L (ref 135–145)

## 2011-08-03 LAB — GLUCOSE, CAPILLARY
Glucose-Capillary: 117 mg/dL — ABNORMAL HIGH (ref 70–99)
Glucose-Capillary: 144 mg/dL — ABNORMAL HIGH (ref 70–99)
Glucose-Capillary: 160 mg/dL — ABNORMAL HIGH (ref 70–99)

## 2011-08-03 MED ORDER — INSULIN GLARGINE 100 UNIT/ML ~~LOC~~ SOLN
45.0000 [IU] | Freq: Every day | SUBCUTANEOUS | Status: DC
  Administered 2011-08-03: 45 [IU] via SUBCUTANEOUS

## 2011-08-03 NOTE — Progress Notes (Signed)
Patient ID: Ethan Jordan  male  WUJ:[id number]    DOB: [birth year]    DOA: 07/19/2011  PCP: No primary provider on file.  Subjective: Tolerating solids, no specific complaints  Objective: Weight change: 0 kg (0 lb)  Intake/Output Summary (Last 24 hours) at 08/03/11 1528 Last data filed at 08/03/11 1350  Gross per 24 hour  Intake    960 ml  Output    250 ml  Net    710 ml   Blood pressure 125/80, pulse 115, temperature 98.8 F (37.1 C), temperature source Oral, resp. rate 17, height 5\' 8"  (1.727 m), weight 83.4 kg (183 lb 13.8 oz), SpO2 95.00%.  Physical Exam: General: Alert and awake, oriented x3, not in any acute distress. HEENT: anicteric sclera, pupils reactive to light and accommodation, EOMI CVS: S1-S2 clear, no murmur rubs or gallops Chest: clear to auscultation bilaterally, no wheezing, rales or rhonchi Abdomen: Minimally tender nondistended, normal bowel sounds, no organomegaly Extremities: no cyanosis, clubbing or edema noted bilaterally   Lab Results: Basic Metabolic Panel:  Lab 08/03/11 9562 08/02/11 0610  NA 143 145  K 3.1* 3.2*  CL 107 107  CO2 26 26  GLUCOSE 149* 143*  BUN 16 24*  CREATININE 1.52* 1.84*  CALCIUM 9.1 9.0  MG -- --  PHOS 3.5 --   Liver Function Tests:  Lab 08/03/11 0640 08/02/11 0610 07/30/11 2258 07/29/11 0411  AST -- -- 51* 80*  ALT -- -- 23 27  ALKPHOS -- -- 103 94  BILITOT -- -- 1.5* 1.1  PROT -- -- 9.7* 8.3  ALBUMIN 2.4* 2.3* -- --    Lab 08/01/11 0630 07/30/11 2258  LIPASE 217* 191*  AMYLASE -- --   No results found for this basename: AMMONIA:2 in the last 168 hours CBC:  Lab 07/30/11 2258 07/29/11 0411 07/28/11 0820  WBC 19.6* 19.8* --  NEUTROABS -- -- 11.2*  HGB 10.5* 8.8* --  HCT 30.8* 26.6* --  MCV 97.2 97.1 --  PLT 462* 385 --   Cardiac Enzymes: No results found for this basename: CKTOTAL:3,CKMB:3,CKMBINDEX:3,TROPONINI:3 in the last 168 hours BNP: No components found with this basename:  POCBNP:2 CBG:  Lab 08/03/11 1113 08/03/11 0729 08/03/11 0409 08/02/11 2357 08/02/11 1947  GLUCAP 263* 117* 168* 132* 133*     Micro Results: Recent Results (from the past 240 hour(s))  URINE CULTURE     Status: Normal   Collection Time   07/26/11 12:58 PM      Component Value Range Status Comment   Specimen Description URINE, CLEAN CATCH   Final    Special Requests NONE   Final    Culture  Setup Time [id number]   Final    Colony Count NO GROWTH   Final    Culture NO GROWTH   Final    Report Status 07/27/2011 FINAL   Final   CULTURE, BLOOD (ROUTINE X 2)     Status: Normal   Collection Time   07/26/11  1:10 PM      Component Value Range Status Comment   Specimen Description BLOOD LEFT ARM   Final    Special Requests BOTTLES DRAWN AEROBIC AND ANAEROBIC 10CC   Final    Culture  Setup Time [id number]   Final    Culture NO GROWTH 5 DAYS   Final    Report Status 08/01/2011 FINAL   Final   CULTURE, BLOOD (ROUTINE X 2)     Status: Normal   Collection Time   07/26/11  1:20 PM      Component Value Range Status Comment   Specimen Description BLOOD LEFT HAND   Final    Special Requests BOTTLES DRAWN AEROBIC ONLY [facility]   Final    Culture  Setup Time [id number]   Final    Culture NO GROWTH 5 DAYS   Final    Report Status 08/01/2011 FINAL   Final     Studies/Results: Ct Abdomen Pelvis Wo Contrast  07/26/2011  *RADIOLOGY REPORT*  Clinical Data: History hepatitis C.  Abdominal pain.  Acute renal failure.  Pancreatitis.  CT ABDOMEN AND PELVIS WITHOUT CONTRAST  Technique:  Multidetector CT imaging of the abdomen and pelvis was performed following the standard protocol without intravenous contrast.  Comparison: CT scan 07/20/2011  Findings: There is nodular air space disease in the right lower lobe suggesting pneumonia or aspiration pneumonitis.  No pericardial fluid.  Non-IV contrast images demonstrate no focal hepatic lesion.  There are small gallstones within the gallbladder.  No gallbladder  distention.  No evidence of biliary ductal dilatation.  There is interval improvement in the peripancreatic inflammation seen on most recent comparison exam.  The pancreas remains mildly edematous through the head.  There are no organized fluid collections.  The spleen and adrenal glands are normal.  The kidneys are slightly enlarged compared to the exam on 07/20/2011.  No evidence of hydronephrosis.  There is a nonobstructing 1 mm calculus in the mid left kidney.  There is no evidence of ureterolithiasis or obstructive uropathy.  No distal ureteral stones or bladder stones.  The bladder is mildly distended with calculated volume of 180 ml.  The stomach, small bowel, appendix, and cecum are normal.  The colon and rectosigmoid colon are normal.  Abdominal aorta normal caliber.  No retroperitoneal periportal lymphadenopathy.  No free fluid the pelvis.  The prostate gland is normal.  No pelvic lymphadenopathy.  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  No acute osseous findings.  IMPRESSION:  1.  Right lower lobe pneumonia versus aspiration pneumonitis. 2.  No evidence of obstructive uropathy of the left or right kidney.  The kidneys are enlarged slightly compared to exam 6 days prior. 3.  Nonobstructing tiny calculus within the left kidney. 4.  Bladder is minimally distended. 5.  Improvement in the pancreatic inflammation.  Original Report Authenticated By: Genevive Bi, M.D.   Ct Abdomen Pelvis Wo Contrast  07/20/2011  *RADIOLOGY REPORT*  Clinical Data: Diabetic ketoacidosis.  Abdominal pain.  Possible sepsis.  CT ABDOMEN AND PELVIS WITHOUT CONTRAST  Technique:  Multidetector CT imaging of the abdomen and pelvis was performed following the standard protocol without intravenous contrast.  Comparison: 04/10/2009.  Findings: The lung bases demonstrate a small effusions and bibasilar atelectasis.  The heart is borderline enlarged.  The unenhanced appearance of the liver is unremarkable.  No focal  lesions or biliary dilatation.  Small gallstones are noted in the gallbladder.  No distention or inflammation.  The common bile duct is normal in caliber.  The pancreas is mildly enlarged and inflamed with peripancreatic inflammatory changes also involving the second portion of the duodenum and also of the mesocolon.  Findings consistent with acute pancreatitis.  The spleen is normal in size.  No focal lesions.  The adrenal glands and kidneys are normal.  No renal or obstructing ureteral calculi.  The stomach is unremarkable.  The third and fourth portion of the duodenum are normal.  The small bowel is unremarkable.  Dense material is noted the  colon.  This is likely ingested material such as Pepto-Bismol.  Mild fibrofatty infiltrative changes involving the colon may suggest a remote inflammatory bowel disease.  No active inflammation is demonstrated.  The appendix is normal. Dense material is noted in the appendix.  The aorta is normal in caliber.  Mild atherosclerotic changes.  No mesenteric or retroperitoneal masses or adenopathy.  There are small scattered lymph nodes typically in the periportal and celiac axis region.  There is a Foley catheter in the bladder.  The prostate gland and seminal vesicles appear normal.  There is artifact through the lower pelvis due to a right hip prosthesis.  No pelvic mass, adenopathy or free pelvic fluid collections.  No inguinal mass or hernia.  The bony structures are intact.  IMPRESSION:  1.  CT findings consistent with acute pancreatitis. 2.  Cholelithiasis. 3.  No other significant abdominal/pelvic findings. 4.  Small bilateral pleural effusions and bibasilar atelectasis.  Original Report Authenticated By: P. Loralie Champagne, M.D.   Dg Chest 2 View  07/25/2011  *RADIOLOGY REPORT*  Clinical Data: Follow-up pneumonia  CHEST - 2 VIEW  Comparison: Chest radiograph 07/23/2011, and multiple priors dating back to 01/03/2010  Findings: Heart size is within normal limits.   Mediastinal hilar contours are stable the trachea is midline.  There is slight pulmonary vascular congestion. Faint, patchy airspace opacities in the lung bases, left greater than right, are similar to the chest radiograph of 07/23/2011.  On the lateral view, a very small unilateral pleural effusion is seen. The bony thorax is unremarkable.  IMPRESSION:  1.  Faint patchy bibasilar airspace opacities, left greater than right without significant change. 2.  Very small unilateral pleural effusion, seen on the lateral view.  Original Report Authenticated By: Britta Mccreedy, M.D.   Ct Head Wo Contrast  07/19/2011  *RADIOLOGY REPORT*  Clinical Data: Altered mental status.  CT HEAD WITHOUT CONTRAST  Technique:  Contiguous axial images were obtained from the base of the skull through the vertex without contrast.  Comparison: Head CT 03/29/2007.  Findings: The ventricles are normal.  No extra-axial fluid collections are seen.  The brainstem and cerebellum are unremarkable.  No acute intracranial findings such as infarction or hemorrhage.  No mass lesions.  The bony calvarium is intact.  The visualized paranasal sinuses and mastoid air cells are clear.  IMPRESSION: No acute intracranial findings or mass lesions.  No change since prior examination.  Original Report Authenticated By: P. Loralie Champagne, M.D.   Dg Chest Port 1 View  07/23/2011  *RADIOLOGY REPORT*  Clinical Data: Respiratory failure  PORTABLE CHEST - 1 VIEW  Comparison: 07/22/2011  Findings:  Heart size is normal.  There is no pleural effusion identified. Bilateral lower lobe airspace opacities are again noted.  Compared with previous exam there is improved aeration to the right base.  IMPRESSION:  1.  Interval improvement in aeration to the right base. 2.  Persistent left base opacity.  Original Report Authenticated By: Rosealee Albee, M.D.   Dg Chest Port 1 View  07/21/2011  *RADIOLOGY REPORT*  Clinical Data: Fever, tachycardia  PORTABLE CHEST - 1 VIEW   Comparison: Portable chest x-ray of 07/20/2011  Findings: The lungs are not well aerated.  Moderate cardiomegaly is stable and there may be mild pulmonary vascular congestion present. No focal infiltrate or effusion is seen.  No bony abnormality is noted.  IMPRESSION: Stable cardiomegaly.  Question mild pulmonary vascular congestion.  Original Report Authenticated By: Juline Patch, M.D.  Dg Chest Port 1 View  07/20/2011  *RADIOLOGY REPORT*  Clinical Data: Assess endotracheal tube.  PORTABLE CHEST - 1 VIEW  Comparison: 07/19/2011.  Findings: Endotracheal tube tip is not visualized on present exam.  Cardiomegaly.  Pulmonary vascular congestion.  No segmental consolidation.  No gross pneumothorax.  IMPRESSION: Endotracheal tube tip is not visualized on the present exam.  Cardiomegaly.  Pulmonary vascular congestion.  Original Report Authenticated By: Fuller Canada, M.D.   Dg Chest Port 1 View  07/19/2011  *RADIOLOGY REPORT*  Clinical Data: Chest pain.  Altered mental status.  PORTABLE CHEST - 1 VIEW  Comparison: Portable chest 01/10/2010.  Findings: The heart is mildly enlarged, as before.  The lung volumes have decreased.  Mild pulmonary vascular congestion is now present.  Mild bibasilar atelectasis is noted.  The right IJ line has been removed.  IMPRESSION:  1.  Borderline cardiomegaly and mild pulmonary vascular congestion. 2.  Low lung volumes. 3.  Mild bibasilar atelectasis without other focal airspace disease.  Original Report Authenticated By: Jamesetta Orleans. MATTERN, M.D.   Dg Chest Port 1v Same Day  07/22/2011  *RADIOLOGY REPORT*  Clinical Data: Respiratory failure  PORTABLE CHEST - 1 VIEW SAME DAY  Comparison: 07/21/2011  Findings: Heart size appears normal.  No pleural effusion noted.  Streaky bibasilar opacities are unchanged from previous exam.  No focal airspace consolidation identified.  IMPRESSION:  1.  No change in aeration to the lung bases compared with prior exam.  Original Report  Authenticated By: Rosealee Albee, M.D.   Dg Chest Port 1v Same Day  07/19/2011  *RADIOLOGY REPORT*  Clinical Data: Aspiration.  PORTABLE CHEST - 1 VIEW SAME DAY  Comparison: Earlier film, same date.  Findings: The heart is borderline enlarged but stable.  There is mild vascular congestion without overt pulmonary edema.  Persistent bibasilar atelectasis.  No definite infiltrates or effusions.  IMPRESSION: Stable chest x-ray.  Original Report Authenticated By: P. Loralie Champagne, M.D.    Medications: Scheduled Meds:    . antiseptic oral rinse  15 mL Mouth Rinse q12n4p  . chlorhexidine  15 mL Mouth Rinse BID  . Fluticasone-Salmeterol  1 puff Inhalation BID  . insulin aspart  0-20 Units Subcutaneous Q4H  . insulin aspart  4 Units Subcutaneous TID WC  . insulin glargine  40 Units Subcutaneous QHS  . pantoprazole  40 mg Oral Q1200  . phosphorus  250 mg Oral TID  . potassium chloride  40 mEq Oral BID   Continuous Infusions:    . lactated ringers 20 mL/hr at 08/01/11 1307  . lactated ringers 20 mL/hr at 08/02/11 0600     Assessment/Plan: Principal Problem:  *DKA (diabetic ketoacidoses): DKA resolved, off IV insulin   - Blood sugars somewhat better controlled, increase Lantus to 45 units, meal coverage insulin 4units TID  - On resistance scale, continue solids  Pancreatitis, acute: general surgery following - Status post laparoscopic cholecystectomy, doing well from surgical standpoint  Active Problems:  Acute respiratory failure: Resolved   Hypoxemia: Likely secondary to pneumonia versus COPD: Continue Advair Diskus, PRN albuterol   Pulmonary edema: Improved, received Lasix IV   Acute renal failure: Improving - Nephrology following  HTN (hypertension): Stable  DVT Prophylaxis: SCDs  Code Status: Full code  Disposition: PTOT evaluation obtained, recommended skilled nursing facility, social worker updated. Likely DC to skilled nursing facility tomorrow if bed available   LOS: 15 days   RAI,RIPUDEEP M.D. [facility] 08/03/2011, 3:28 PM Pager: [phone]

## 2011-08-03 NOTE — Evaluation (Signed)
Physical Therapy Evaluation Patient Details Name: Ethan Jordan MRN: [id number] DOB: Oct 31, 1955 Today's Date: 08/03/2011  Problem List:  Patient Active Problem List  Diagnoses  . DKA (diabetic ketoacidoses)  . Acute respiratory failure  . Hypoxemia  . Pulmonary edema  . Acute renal failure  . Tachycardia  . HTN (hypertension)  . Pancreatitis, acute  . Hyperosmolality and hypernatremia  . Hypophosphatemia    Past Medical History:  Past Medical History  Diagnosis Date  . Renal disorder   . Hepatitis C   . COPD (chronic obstructive pulmonary disease)     On inhaled steroids  . Diabetes mellitus    Past Surgical History:  Past Surgical History  Procedure Date  . Total hip arthroplasty     right  . Cholecystectomy 08/01/2011    Procedure: LAPAROSCOPIC CHOLECYSTECTOMY WITH INTRAOPERATIVE CHOLANGIOGRAM;  Surgeon: Robyne Askew, MD;  Location: [facility] OR;  Service: General;  Laterality: N/A;    PT Assessment/Plan/Recommendation PT Assessment Clinical Impression Statement: Ethan Jordan is a pleasant 56 y/o male who presents to PT today s/p lap cholecystectomy POD #2. Will benefit physical therapy in the acute setting to address the below impairments as well as to maximize function and independence so as to decrease burden of care at next venue. Rec SNF for f/u therapies as pt will not have appropriate 24 hour assist at home (pt at risk for falling secondary to weakness and pain).  PT Recommendation/Assessment: Patient will need skilled PT in the acute care venue PT Problem List: Decreased strength;Decreased activity tolerance;Decreased balance;Decreased mobility;Pain;Decreased knowledge of use of DME Barriers to Discharge: Decreased caregiver support PT Therapy Diagnosis : Abnormality of gait;Difficulty walking;Generalized weakness;Acute pain PT Plan PT Frequency: Min 3X/week PT Treatment/Interventions: DME instruction;Gait training;Functional mobility training;Therapeutic  activities;Therapeutic exercise;Balance training;Patient/family education;Neuromuscular re-education PT Recommendation Follow Up Recommendations: Skilled nursing facility Equipment Recommended: Defer to next venue PT Goals  Acute Rehab PT Goals PT Goal Formulation: With patient Time For Goal Achievement: 2 weeks Pt will go Supine/Side to Sit: with modified independence PT Goal: Supine/Side to Sit - Progress: Goal set today Pt will go Sit to Supine/Side: with modified independence PT Goal: Sit to Supine/Side - Progress: Goal set today Pt will go Sit to Stand: with modified independence PT Goal: Sit to Stand - Progress: Goal set today Pt will Transfer Bed to Chair/Chair to Bed: with modified independence PT Transfer Goal: Bed to Chair/Chair to Bed - Progress: Goal set today Pt will Ambulate: >150 feet;with least restrictive assistive device;with modified independence PT Goal: Ambulate - Progress: Goal set today Pt will Perform Home Exercise Program: Independently PT Goal: Perform Home Exercise Program - Progress: Goal set today  PT Evaluation Precautions/Restrictions  Precautions Precautions: Fall Restrictions Weight Bearing Restrictions: No Prior Functioning  Home Living Lives With: Daughter;Other (Comment) (2 granddaughters) Available Help at Discharge: Available PRN/intermittently;Family Type of Home: House Home Access: Stairs to enter Secretary/administrator of Steps: 3 Entrance Stairs-Rails: None Home Layout: Two level (bed downstairs; bathroom upstairs) Alternate Level Stairs-Number of Steps: 15 Alternate Level Stairs-Rails: Right Bathroom Shower/Tub: Tub/shower unit;Curtain Bathroom Toilet: Standard Home Adaptive Equipment: Environmental consultant - four wheeled Prior Function Level of Independence: Independent with assistive device(s) Able to Take Stairs?: Yes Vocation: Retired Financial risk analyst Overall Cognitive Status: Appears within functional limits for tasks  assessed/performed Arousal/Alertness: Awake/alert Orientation Level: Appears intact for tasks assessed Behavior During Session: [facility] for tasks performed Sensation/Coordination Sensation Light Touch: Appears Intact Coordination Gross Motor Movements are Fluid and Coordinated: Yes Fine Motor Movements are  Fluid and Coordinated: Yes Extremity Assessment RLE Assessment RLE Assessment:  (grossly 3-4/5) LLE Assessment LLE Assessment:  (grossly 3-4/5) Mobility (including Balance) Bed Mobility Bed Mobility: No (pt sitting upright on EOB) Transfers Sit to Stand: 5: Supervision;From bed;With upper extremity assist Sit to Stand Details (indicate cue type and reason): pt with crouched stance upon standing needing to reach out for environmental supports as he pulled his pants up Ambulation/Gait Ambulation/Gait: Yes Ambulation/Gait Assistance: 4: Min assist Ambulation/Gait Assistance Details (indicate cue type and reason): amb approx 130 ft with RW; slow flexed gait, min tactile cues for safe technique especially when turning pt's feet a bit shuffled and tend to kick RW; pt has decreased hip/knee flexion on the right requring more trunk extension to swing his leg through on that side (reports hip replacement approx 2-3 months ago) Ambulation Distance (Feet): 130 Feet Assistive device: Rolling walker Gait Pattern: Step-through pattern;Decreased hip/knee flexion - right;Lateral trunk lean to left;Decreased weight shift to right (decreased BOS)    Exercise    End of Session PT - End of Session Equipment Utilized During Treatment: Gait belt Activity Tolerance: Patient tolerated treatment well;Patient limited by fatigue Patient left: in bed;with call bell in reach (pt sitting EOB) General Behavior During Session: [facility] for tasks performed Cognition: [facility] for tasks performed  [facility] HELEN 08/03/2011, 2:09 PM

## 2011-08-03 NOTE — Progress Notes (Addendum)
Clinical Social Work Department CLINICAL SOCIAL WORK PLACEMENT NOTE 08/03/2011  Patient:  Ethan Jordan,Ethan Jordan  Account Number:  [id number] Admit date:  07/19/2011  Clinical Social Worker:  Jacelyn Grip  Date/time:  08/03/2011 10:45 AM  Clinical Social Work is seeking post-discharge placement for this patient at the following level of care:   SKILLED NURSING   (*CSW will update this form in Epic as items are completed)   08/03/2011  Patient/family provided with [facility] Department of Clinical Social Work's list of facilities offering this level of care within the geographic area requested by the patient (or if unable, by the patient's family).  08/03/2011  Patient/family informed of their freedom to choose among providers that offer the needed level of care, that participate in Medicare, Medicaid or managed care program needed by the patient, have an available bed and are willing to accept the patient.  08/03/2011  Patient/family informed of [facility]' ownership interest in [facility], as well as of the fact that they are under no obligation to receive care at this facility.  PASARR submitted to EDS on 08/03/2011 PASARR number received from EDS on 08/04/2011  FL2 transmitted to all facilities in geographic area requested by pt/family on  08/03/2011 FL2 transmitted to all facilities within larger geographic area on   Patient informed that his/her managed care company has contracts with or will negotiate with  certain facilities, including the following:     Patient/family informed of bed offers received:  08/03/2011 Patient chooses bed at [facility] Physician recommends and patient chooses bed at    Patient to be transferred to  on  [facility] on 08/04/2011 Patient to be transferred to facility by pt daughter by car  The following physician request were entered in Epic:   Additional Comments: SNF search initiated in [place] and  [employer].   Late Entry for completion of placement note from pt discharge on 08/04/2011  Jacklynn Lewis, MSW, LCSWA  Clinical Social Work [phone]

## 2011-08-03 NOTE — Progress Notes (Signed)
Patient ID: Ethan Jordan, male   DOB: [birth year], 56 y.o.   MRN: [id number] 2 Days Post-Op  Subjective: Pt is feeling well.  No significant pain.  Tolerating a regular diet.  Objective: Vital signs in last 24 hours: Temp:  [97 F (36.1 C)-99.2 F (37.3 C)] 99.2 F (37.3 C) (04/18 0452) Pulse Rate:  [87-113] 109  (04/18 0452) Resp:  [18] 18  (04/18 0452) BP: (113-122)/(65-77) 122/77 mmHg (04/18 0452) SpO2:  [92 %-100 %] 92 % (04/18 0452) Weight:  [183 lb 13.8 oz (83.4 kg)] 183 lb 13.8 oz (83.4 kg) (04/17 2119) Last BM Date: 08/03/11  Intake/Output from previous day: 04/17 0701 - 04/18 0700 In: 960 [P.O.:960] Out: 550 [Urine:550] Intake/Output this shift:    PE: Abd: soft, minimally tender, incisions c/d/i with dermabond, +BS, ND  Lab Results:  No results found for this basename: WBC:2,HGB:2,HCT:2,PLT:2 in the last 72 hours BMET  Basename 08/03/11 0640 08/02/11 0610  NA 143 145  K 3.1* 3.2*  CL 107 107  CO2 26 26  GLUCOSE 149* 143*  BUN 16 24*  CREATININE 1.52* 1.84*  CALCIUM 9.1 9.0   PT/INR  Basename 08/01/11 0630  LABPROT 16.1*  INR 1.26   CMP     Component Value Date/Time   NA 143 08/03/2011 0640   K 3.1* 08/03/2011 0640   CL 107 08/03/2011 0640   CO2 26 08/03/2011 0640   GLUCOSE 149* 08/03/2011 0640   BUN 16 08/03/2011 0640   CREATININE 1.52* 08/03/2011 0640   CALCIUM 9.1 08/03/2011 0640   PROT 9.7* 07/30/2011 2258   ALBUMIN 2.4* 08/03/2011 0640   AST 51* 07/30/2011 2258   ALT 23 07/30/2011 2258   ALKPHOS 103 07/30/2011 2258   BILITOT 1.5* 07/30/2011 2258   GFRNONAA 50* 08/03/2011 0640   GFRAA 58* 08/03/2011 0640   Lipase     Component Value Date/Time   LIPASE 217* 08/01/2011 0630       Studies/Results: Dg Cholangiogram Operative  08/01/2011  *RADIOLOGY REPORT*  Clinical Data:   Laparoscopic cholecystectomy  INTRAOPERATIVE CHOLANGIOGRAM  Technique:  Cholangiographic images from the C-arm fluoroscopic device were submitted for interpretation  post-operatively.  Please see the procedural report for the amount of contrast and the fluoroscopy time utilized.  Comparison:  None.  Findings:  There is good opacification of normal caliber common and intrahepatic bile ducts.  No filling defect is seen. Cholecystectomy clips are noted.  Prompt excretion into normal- appearing duodenum is noted.  IMPRESSION: No filling defect or ductal dilatation on intraoperative cholangiogram.  Original Report Authenticated By: Harrel Lemon, M.D.    Anti-infectives: Anti-infectives     Start     Dose/Rate Route Frequency Ordered Stop   07/28/11 1200   imipenem-cilastatin (PRIMAXIN) 250 mg in sodium chloride 0.9 % 100 mL IVPB  Status:  Discontinued        250 mg 200 mL/hr over 30 Minutes Intravenous 4 times per day 07/28/11 0937 08/01/11 1949   07/26/11 1000   imipenem-cilastatin (PRIMAXIN) 500 mg in sodium chloride 0.9 % 100 mL IVPB  Status:  Discontinued        500 mg 200 mL/hr over 30 Minutes Intravenous Every 12 hours 07/26/11 0807 07/28/11 0937   07/22/11 1500   vancomycin (VANCOCIN) IVPB 1000 mg/200 mL premix  Status:  Discontinued        1,000 mg 200 mL/hr over 60 Minutes Intravenous Every 8 hours 07/22/11 1414 07/25/11 0848   07/22/11 1415   piperacillin-tazobactam (ZOSYN) IVPB  3.375 g  Status:  Discontinued        3.375 g 12.5 mL/hr over 240 Minutes Intravenous 3 times per day 07/22/11 1414 07/25/11 0848   07/19/11 2100   ciprofloxacin (CIPRO) IVPB 400 mg  Status:  Discontinued        400 mg 200 mL/hr over 60 Minutes Intravenous Every 24 hours 07/19/11 2046 07/20/11 1103   07/19/11 2015   metroNIDAZOLE (FLAGYL) IVPB 500 mg  Status:  Discontinued        500 mg 100 mL/hr over 60 Minutes Intravenous Every 8 hours 07/19/11 2004 07/20/11 1103   07/19/11 2015   ciprofloxacin (CIPRO) IVPB 400 mg  Status:  Discontinued        400 mg 200 mL/hr over 60 Minutes Intravenous Every 8 hours 07/19/11 2004 07/19/11 2046            Assessment/Plan  1. S/p lap chole  Plan: 1. Pt doing well from a surgical standpoint.  His follow up is set up in the discharge section.  He is stable for discharge whenever medically clear.  Please call as needed.   LOS: 15 days    Maesyn Frisinger E 08/03/2011

## 2011-08-03 NOTE — Progress Notes (Signed)
Previous Clinical Child psychotherapist completed assessment and signed off. This Clinical Child psychotherapist received referral for New SNF placement. Clinical Social Worker met with pt at bedside to discuss pt discharge needs. Pt discussed that he lives with his daughter, but his daughter works during the day and is unable to provide 24 hour assistance/supervision. Clinical Social Worker discussed recommendation for short term rehab and pt is agreeable to initiation of SNF search in [place] and [place], but wants to speak further to his daughter about options for discharge plan. Clinical Child psychotherapist completed [employer] and initiated SNF search in [place] and [place]. Clinical Social Worker to follow up with pt and pt daughter in regard to bed offers and further discuss discharge plan with pt and pt daughter. Clinical Social Worker to facilitate pt discharge needs when pt medically ready for discharge.  Jacklynn Lewis, MSW, LCSWA  Clinical Social Work [phone]
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
## 2011-08-03 NOTE — Progress Notes (Signed)
Clinical Social Worker and Sports coach met with pt and pt daughter at bedside to discuss pt bed offers. Clinical Social Worker provided pt and pt daughter bed offers and discussed that pt would have to stay at SNF for 30 days in order for pt insurance to cover SNF placement. Pt and pt daughter plan to discuss discharge plan this evening and Clinical Social Worker to follow up in am to determine decision. Clinical Social Worker to follow up with pt and pt daughter in the morning and facilitate pt discharge needs as appropriate.  Jacklynn Lewis, MSW, LCSWA  Clinical Social Work [phone]

## 2011-08-03 NOTE — Progress Notes (Signed)
I have allowed the pt to give himself insulin at breakfast and lunch, and will allow him to do the same at dinner time. Pt has done well with the injections, only requiring occasional reminders, and says he feels comfortable with it.

## 2011-08-04 LAB — GLUCOSE, CAPILLARY: Glucose-Capillary: 178 mg/dL — ABNORMAL HIGH (ref 70–99)

## 2011-08-04 LAB — RENAL FUNCTION PANEL
BUN: 12 mg/dL (ref 6–23)
CO2: 27 mEq/L (ref 19–32)
Chloride: 105 mEq/L (ref 96–112)
GFR calc Af Amer: 66 mL/min — ABNORMAL LOW (ref >90)
Glucose, Bld: 132 mg/dL — ABNORMAL HIGH (ref 70–99)
Potassium: 3.1 mEq/L — ABNORMAL LOW (ref 3.5–5.1)

## 2011-08-04 MED ORDER — HYDROCODONE-ACETAMINOPHEN 5-325 MG PO TABS: 1.0000 | ORAL_TABLET | Freq: Four times a day (QID) | ORAL | Status: AC | PRN

## 2011-08-04 MED ORDER — ALBUTEROL SULFATE (5 MG/ML) 0.5% IN NEBU: 2.5000 mg | INHALATION_SOLUTION | RESPIRATORY_TRACT | Status: DC | PRN

## 2011-08-04 MED ORDER — INSULIN GLARGINE 100 UNIT/ML ~~LOC~~ SOLN: 45.0000 [IU] | Freq: Every day | SUBCUTANEOUS | Status: AC

## 2011-08-04 MED ORDER — INSULIN ASPART 100 UNIT/ML ~~LOC~~ SOLN: 4.0000 [IU] | Freq: Three times a day (TID) | SUBCUTANEOUS | Status: DC

## 2011-08-04 MED ORDER — K PHOS MONO-SOD PHOS DI & MONO 155-852-130 MG PO TABS: 1.0000 | ORAL_TABLET | Freq: Three times a day (TID) | ORAL | Status: DC

## 2011-08-04 MED ORDER — INSULIN ASPART 100 UNIT/ML ~~LOC~~ SOLN: 0.0000 [IU] | Freq: Three times a day (TID) | SUBCUTANEOUS | Status: AC

## 2011-08-04 NOTE — Progress Notes (Signed)
Clinical Social Worker facilitated pt discharge needs including contacting facility and notifying pt and pt daughter at bedside. Pt transporting to [facility] by pt daughter by car at discharge. Pt discharge packet provided to pt daughter to provide [facility]. No further social work needs identified at this time. Clinical Social Worker signing off.   Jacklynn Lewis, MSW, LCSWA  Clinical Social Work [phone]

## 2011-08-04 NOTE — Discharge Summary (Signed)
Physician Discharge Summary  Patient ID: Ethan Jordan MRN: [id number] DOB/AGE: 55-Apr-1957 55 y.o.  Admit date: 07/19/2011 Discharge date: 08/04/2011  Primary Care Physician:  No primary provider on file.  Discharge Diagnoses:    .DKA (diabetic ketoacidoses) resolved  . Diabetes mellitus type 2, insulin-dependent, uncontrolled  .Acute respiratory failure resolved  .Acute renal failure resolved  .Tachycardia resolved  .HTN (hypertension) .Pancreatitis, acute secondary to acute cholecystitis status post laparoscopic cholecystectomy on 08/01/2011  .Hyperosmolality and hypernatremia resolved  .Hypophosphatemia  Consults:  Pulmonary critical care service                    Nephrology service, Dr. Abel Presto                     Gen. surgery, Dr. Chevis Pretty     Discharge Medications: Medication List  As of 08/04/2011  1:49 PM   TAKE these medications         albuterol (5 MG/ML) 0.5% nebulizer solution   Commonly known as: PROVENTIL   Take 0.5 mLs (2.5 mg total) by nebulization every 3 (three) hours as needed for wheezing or shortness of breath.      CALCIUM 600 + D PO   Take 3 tablets by mouth daily.      DULERA 200-5 MCG/ACT Aero   Generic drug: Mometasone Furo-Formoterol Fum   Inhale 2 puffs into the lungs 2 (two) times daily.      fluticasone 50 MCG/ACT nasal spray   Commonly known as: FLONASE   Place 2 sprays into the nose daily.      Fluticasone-Salmeterol 250-50 MCG/DOSE Aepb   Commonly known as: ADVAIR   Inhale 1 puff into the lungs every 12 (twelve) hours.      HYDROcodone-acetaminophen 5-325 MG per tablet   Commonly known as: NORCO   Take 1-2 tablets by mouth every 6 (six) hours as needed for pain.      insulin aspart 100 UNIT/ML injection   Commonly known as: novoLOG   Inject 4 Units into the skin 3 (three) times daily with meals.      insulin aspart 100 UNIT/ML injection   Commonly known as: novoLOG   Inject 0-15 Units into the skin 3 (three) times daily  before meals. SLIDING SCALE INSULIN    CBG < 70: Drink juice; CBG 70 - 120: 0 units: CBG 121 - 150: 2 units; CBG 151 - 200: 3 units; CBG 201 - 250: 5 units; CBG 251 - 300: 8 units;CBG 301 - 350: 11 units; CBG 351 - 400: 15 units; CBG > 400 : 15 units and NOTIFY MD      insulin glargine 100 UNIT/ML injection   Commonly known as: LANTUS   Inject 45 Units into the skin at bedtime.      Magnesium 250 MG Tabs   Take 1 tablet by mouth 2 (two) times daily.      omeprazole 40 MG capsule   Commonly known as: PRILOSEC   Take 40 mg by mouth daily.      phosphorus 155-852-130 MG tablet   Commonly known as: K PHOS NEUTRAL   Take 1 tablet by mouth 3 (three) times daily.      sucralfate 1 G tablet   Commonly known as: CARAFATE   Take 1 g by mouth 4 (four) times daily.             Brief H and P: For complete details please refer to admission H and  P, but in brief, 56 year old man on disability due to history of substance abuse, hep C and etoh who presents with the hospital with 2 days history of N/V/D non-bloody. No fever/chills/night sweats but diffuse abdominal pain. Patient was brought in by his daughter due to SOB and AMS that started on the day of admission. Patient was admitted by critical care service to the intensive care unit    Hospital Course:  56 yo M on disability d/t history of substance abuse, hep C and etoh who presented with two-day history of nonbloody nausea vomiting and diarrhea. Patient had no fevers or chills but complained of diffuse abdominal pain at the time of admission. He was admitted to the ICU on 07/19/11 by the pulmonary critical care service. He was diagnosed with acute pancreatitis, DKA, new onset DM and pneumonia. Patient was started on empiric antibiotics.   *DKA (diabetic ketoacidoses): On 07/19/2011, patient was found to have a sodium of 153 with creatinine of 1.7, bicarbonate of 22 with a blood glucose of 1070. Patient was admitted to ICU and started on insulin  drip. Patient had difficult control of blood sugars and continue to have hyperglycemia requiring of non-insulin drip. Hemoglobin A1c was found to be 9.5. Patient was transferred to the floor on 07/25/2011, eventually his blood sugars became better controlled on Lantus, meal coverage and sliding scale insulin. He will require intensive outpatient diabetic teaching.  Pancreatitis, acute: Patient was found to have a lipase of 2187 with elevated amylase of 1038. CT abdomen showed mild acute pancreatitis without any necrosis/pseudocyst with cholelithiasis but no CBD dilatation. Gastroenterology was consulted and recommended no need for ERCP at this time. General surgery was consulted, patient underwent laparoscopic cholecystectomy on 08/01/2011. He is doing well from surgical standpoint, tolerating solid diet  Acute respiratory failure: Secondary to hypoxemia/pneumonia/COPD: Resolved, patient received IV Lasix and was continued on Advair Diskus and PRN albuterol.   Acute renal failure: Improving, likely secondary to acute DKA, dehydration, acute pancreatitis. Patient was also closely followed by nephrology service.    Day of Discharge BP 122/70  Pulse 89  Temp(Src) 97.6 F (36.4 C) (Oral)  Resp 18  Ht 5\' 8"  (1.727 m)  Wt 83.4 kg (183 lb 13.8 oz)  BMI 27.96 kg/m2  SpO2 95%  Physical Exam:  General: Alert and awake, oriented x3, not in any acute distress.  HEENT: anicteric sclera, pupils reactive to light and accommodation, EOMI  CVS: S1-S2 clear, no murmur rubs or gallops  Chest: clear to auscultation bilaterally, no wheezing, rales or rhonchi  Abdomen: Minimally tender nondistended, normal bowel sounds, no organomegaly  Extremities: no cyanosis, clubbing or edema noted bilaterally   The results of significant diagnostics from this hospitalization (including imaging, microbiology, ancillary and laboratory) are listed below for reference.    LAB RESULTS: Basic Metabolic Panel:  Lab  08/04/11 0555 08/03/11 0640  NA 143 143  K 3.1* 3.1*  CL 105 107  CO2 27 26  GLUCOSE 132* 149*  BUN 12 16  CREATININE 1.38* 1.52*  CALCIUM 8.9 9.1  MG -- --  PHOS 3.8 --   Liver Function Tests:  Lab 08/04/11 0555 08/03/11 0640 07/30/11 2258 07/29/11 0411  AST -- -- 51* 80*  ALT -- -- 23 27  ALKPHOS -- -- 103 94  BILITOT -- -- 1.5* 1.1  PROT -- -- 9.7* 8.3  ALBUMIN 2.5* 2.4* -- --    Lab 08/01/11 0630 07/30/11 2258  LIPASE 217* 191*  AMYLASE -- --  No results found for this basename: AMMONIA:2 in the last 168 hours CBC:  Lab 07/30/11 2258 07/29/11 0411  WBC 19.6* 19.8*  NEUTROABS -- --  HGB 10.5* 8.8*  HCT 30.8* 26.6*  MCV 97.2 --  PLT 462* 385   CBG:  Lab 08/04/11 1212 08/04/11 0732  GLUCAP 178* 126*    Significant Diagnostic Studies:  Ct Abdomen Pelvis Wo Contrast  07/20/2011  *RADIOLOGY REPORT*  Clinical Data: Diabetic ketoacidosis.  Abdominal pain.  Possible sepsis.  CT ABDOMEN AND PELVIS WITHOUT CONTRAST  Technique:  Multidetector CT imaging of the abdomen and pelvis was performed following the standard protocol without intravenous contrast.  Comparison: 04/10/2009.  Findings: The lung bases demonstrate a small effusions and bibasilar atelectasis.  The heart is borderline enlarged.  The unenhanced appearance of the liver is unremarkable.  No focal lesions or biliary dilatation.  Small gallstones are noted in the gallbladder.  No distention or inflammation.  The common bile duct is normal in caliber.  The pancreas is mildly enlarged and inflamed with peripancreatic inflammatory changes also involving the second portion of the duodenum and also of the mesocolon.  Findings consistent with acute pancreatitis.  The spleen is normal in size.  No focal lesions.  The adrenal glands and kidneys are normal.  No renal or obstructing ureteral calculi.  The stomach is unremarkable.  The third and fourth portion of the duodenum are normal.  The small bowel is unremarkable.  Dense  material is noted the colon.  This is likely ingested material such as Pepto-Bismol.  Mild fibrofatty infiltrative changes involving the colon may suggest a remote inflammatory bowel disease.  No active inflammation is demonstrated.  The appendix is normal. Dense material is noted in the appendix.  The aorta is normal in caliber.  Mild atherosclerotic changes.  No mesenteric or retroperitoneal masses or adenopathy.  There are small scattered lymph nodes typically in the periportal and celiac axis region.  There is a Foley catheter in the bladder.  The prostate gland and seminal vesicles appear normal.  There is artifact through the lower pelvis due to a right hip prosthesis.  No pelvic mass, adenopathy or free pelvic fluid collections.  No inguinal mass or hernia.  The bony structures are intact.  IMPRESSION:  1.  CT findings consistent with acute pancreatitis. 2.  Cholelithiasis. 3.  No other significant abdominal/pelvic findings. 4.  Small bilateral pleural effusions and bibasilar atelectasis.  Original Report Authenticated By: P. Loralie Champagne, M.D.   Ct Head Wo Contrast  07/19/2011  *RADIOLOGY REPORT*  Clinical Data: Altered mental status.  CT HEAD WITHOUT CONTRAST  Technique:  Contiguous axial images were obtained from the base of the skull through the vertex without contrast.  Comparison: Head CT 03/29/2007.  Findings: The ventricles are normal.  No extra-axial fluid collections are seen.  The brainstem and cerebellum are unremarkable.  No acute intracranial findings such as infarction or hemorrhage.  No mass lesions.  The bony calvarium is intact.  The visualized paranasal sinuses and mastoid air cells are clear.  IMPRESSION: No acute intracranial findings or mass lesions.  No change since prior examination.  Original Report Authenticated By: P. Loralie Champagne, M.D.   Dg Chest Port 1 View  07/20/2011  *RADIOLOGY REPORT*  Clinical Data: Assess endotracheal tube.  PORTABLE CHEST - 1 VIEW  Comparison:  07/19/2011.  Findings: Endotracheal tube tip is not visualized on present exam.  Cardiomegaly.  Pulmonary vascular congestion.  No segmental consolidation.  No gross pneumothorax.  IMPRESSION: Endotracheal tube tip is not visualized on the present exam.  Cardiomegaly.  Pulmonary vascular congestion.  Original Report Authenticated By: Fuller Canada, M.D.   Dg Chest Port 1 View  07/19/2011  *RADIOLOGY REPORT*  Clinical Data: Chest pain.  Altered mental status.  PORTABLE CHEST - 1 VIEW  Comparison: Portable chest 01/10/2010.  Findings: The heart is mildly enlarged, as before.  The lung volumes have decreased.  Mild pulmonary vascular congestion is now present.  Mild bibasilar atelectasis is noted.  The right IJ line has been removed.  IMPRESSION:  1.  Borderline cardiomegaly and mild pulmonary vascular congestion. 2.  Low lung volumes. 3.  Mild bibasilar atelectasis without other focal airspace disease.  Original Report Authenticated By: Jamesetta Orleans. MATTERN, M.D.   Dg Chest Port 1v Same Day  07/19/2011  *RADIOLOGY REPORT*  Clinical Data: Aspiration.  PORTABLE CHEST - 1 VIEW SAME DAY  Comparison: Earlier film, same date.  Findings: The heart is borderline enlarged but stable.  There is mild vascular congestion without overt pulmonary edema.  Persistent bibasilar atelectasis.  No definite infiltrates or effusions.  IMPRESSION: Stable chest x-ray.  Original Report Authenticated By: P. Loralie Champagne, M.D.     Disposition and Follow-up: Discharge Orders    Future Orders Please Complete By Expires   Diet Carb Modified      Increase activity slowly          DISPOSITION: Skilled nursing facility for rehabilitation  DIET: Carb modified diet  ACTIVITY: As tolerated  DISCHARGE FOLLOW-UP Follow-up Information    Follow up with Robyne Askew, MD. Schedule an appointment as soon as possible for a visit in 3 weeks.   Contact information:   [employer] [address] [place] [address] [phone]          Time spent on Discharge: 45 minutes  Signed:  Mirca Yale M.D. [facility] 08/04/2011, 1:49 PM

## 2011-08-04 NOTE — Progress Notes (Signed)
Patient gave himself 2 of his nightly doses of insulin with supervision.

## 2011-08-04 NOTE — Progress Notes (Signed)
Clinical Social Worker and Sports coach met with pt and pt daughter at bedside to discuss pt discharge needs. Pt and pt daughter are agreeable to pt going to SNF and choose [facility]. Clinical Social Worker contacted facility who confirmed bed availability for today. Clinical Social Worker notified MD. Clinical Social Worker to facilitate pt discharge needs this afternoon.  Jacklynn Lewis, MSW, LCSWA  Clinical Social Work [phone]

## 2011-08-04 NOTE — Progress Notes (Signed)
PT TREATMENT NOTE  08/04/11 1514  PT Visit Information  Last PT Received On 08/04/11  PT Time Calculation  PT Start Time 1055  PT Stop Time 1115  PT Time Calculation (min) 20 min  Precautions  Precautions Fall  Restrictions  Weight Bearing Restrictions No  Cognition  Overall Cognitive Status Appears within functional limits for tasks assessed/performed  Arousal/Alertness Awake/alert  Orientation Level Appears intact for tasks assessed  Behavior During Session [facility] for tasks performed  Bed Mobility  Bed Mobility Left Sidelying to Sit;Sit to Supine  Left Sidelying to Sit 4: Min guard;With rails;HOB elevated  Sit to Supine 5: Supervision;With rail;HOB elevated  Details for Bed Mobility Assistance vc's for safe technique  Transfers  Transfers Sit to Stand;Stand to Sit  Sit to Stand 5: Supervision;From bed;With upper extremity assist  Stand to Sit 5: Supervision;With upper extremity assist;To bed  Details for Transfer Assistance safe technique in sit to/from stand  Ambulation/Gait  Ambulation/Gait Assistance 4: Min guard  Ambulation Distance (Feet) 200 Feet  Assistive device Rolling walker  Ambulation/Gait Assistance Details vc's for keeping RW close enough to him and for erect posture  Gait Pattern Step-through pattern;Decreased hip/knee flexion - right;Lateral trunk lean to left;Decreased weight shift to right  Gait velocity slowed  PT - End of Session  Equipment Utilized During Treatment Other (comment) (pt. politely declined gait belt)  Activity Tolerance Patient tolerated treatment well;Patient limited by fatigue  Patient left in bed;with call bell/phone within reach;with family/visitor present  PT - Assessment/Plan  Comments on Treatment Session Pt. is progressing his ambulation but is not yet at mod I level for return home.  PT Plan Discharge plan remains appropriate  PT Frequency Min 3X/week  Follow Up Recommendations Skilled nursing facility  Equipment Recommended Defer  to next venue  Acute Rehab PT Goals  PT Goal: Sit to Supine/Side - Progress Progressing toward goal  PT Goal: Sit to Stand - Progress Progressing toward goal  PT Goal: Ambulate - Progress Progressing toward goal  Weldon Picking PT Acute Rehab Services [phone] [place] [phone]

## 2012-04-12 ENCOUNTER — Emergency Department (HOSPITAL_COMMUNITY): Payer: Medicaid Other

## 2012-04-12 ENCOUNTER — Encounter (HOSPITAL_COMMUNITY): Payer: Self-pay

## 2012-04-12 ENCOUNTER — Emergency Department (HOSPITAL_COMMUNITY)
Admission: EM | Admit: 2012-04-12 | Discharge: 2012-04-12 | Disposition: A | Discharge status: Home / Self Care | Payer: Medicaid Other | Attending: Emergency Medicine | Admitting: Emergency Medicine

## 2012-04-12 ENCOUNTER — Emergency Department (HOSPITAL_COMMUNITY)
Admission: EM | Admit: 2012-04-12 | Discharge: 2012-04-12 | Disposition: A | Discharge status: Nursing Home (Includes ICF) | Payer: Medicaid Other | Source: Home / Self Care | Attending: Emergency Medicine | Admitting: Emergency Medicine

## 2012-04-12 ENCOUNTER — Encounter (HOSPITAL_COMMUNITY): Payer: Self-pay | Admitting: Emergency Medicine

## 2012-04-12 DIAGNOSIS — R05 Cough: Secondary | ICD-10-CM | POA: Insufficient documentation

## 2012-04-12 DIAGNOSIS — R059 Cough, unspecified: Secondary | ICD-10-CM | POA: Insufficient documentation

## 2012-04-12 DIAGNOSIS — F17200 Nicotine dependence, unspecified, uncomplicated: Secondary | ICD-10-CM | POA: Insufficient documentation

## 2012-04-12 DIAGNOSIS — J4489 Other specified chronic obstructive pulmonary disease: Secondary | ICD-10-CM | POA: Insufficient documentation

## 2012-04-12 DIAGNOSIS — E119 Type 2 diabetes mellitus without complications: Secondary | ICD-10-CM | POA: Insufficient documentation

## 2012-04-12 DIAGNOSIS — R0600 Dyspnea, unspecified: Secondary | ICD-10-CM

## 2012-04-12 DIAGNOSIS — Z794 Long term (current) use of insulin: Secondary | ICD-10-CM | POA: Insufficient documentation

## 2012-04-12 DIAGNOSIS — J449 Chronic obstructive pulmonary disease, unspecified: Secondary | ICD-10-CM | POA: Insufficient documentation

## 2012-04-12 DIAGNOSIS — Z79899 Other long term (current) drug therapy: Secondary | ICD-10-CM | POA: Insufficient documentation

## 2012-04-12 DIAGNOSIS — R0609 Other forms of dyspnea: Secondary | ICD-10-CM

## 2012-04-12 DIAGNOSIS — J189 Pneumonia, unspecified organism: Secondary | ICD-10-CM | POA: Insufficient documentation

## 2012-04-12 DIAGNOSIS — Z8619 Personal history of other infectious and parasitic diseases: Secondary | ICD-10-CM | POA: Insufficient documentation

## 2012-04-12 DIAGNOSIS — R0789 Other chest pain: Secondary | ICD-10-CM | POA: Insufficient documentation

## 2012-04-12 DIAGNOSIS — Z87448 Personal history of other diseases of urinary system: Secondary | ICD-10-CM | POA: Insufficient documentation

## 2012-04-12 LAB — COMPREHENSIVE METABOLIC PANEL
ALT: 20 U/L (ref 0–53)
Albumin: 3.7 g/dL (ref 3.5–5.2)
Alkaline Phosphatase: 103 U/L (ref 39–117)
Potassium: 5 mEq/L (ref 3.5–5.1)
Sodium: 140 mEq/L (ref 135–145)
Total Protein: 9 g/dL — ABNORMAL HIGH (ref 6.0–8.3)

## 2012-04-12 LAB — CBC WITH DIFFERENTIAL/PLATELET
Basophils Absolute: 0 10*3/uL (ref 0.0–0.1)
Basophils Relative: 0 % (ref 0–1)
Eosinophils Absolute: 0.3 10*3/uL (ref 0.0–0.7)
Eosinophils Relative: 3 % (ref 0–5)
Hemoglobin: 14.7 g/dL (ref 13.0–17.0)
MCH: 33.9 pg (ref 26.0–34.0)
MCV: 97.7 fL (ref 78.0–100.0)
Metamyelocytes Relative: 0 %
Myelocytes: 0 %
Platelets: 205 10*3/uL (ref 150–400)
RBC: 4.34 MIL/uL (ref 4.22–5.81)

## 2012-04-12 MED ORDER — LEVOFLOXACIN 750 MG PO TABS
750.0000 mg | ORAL_TABLET | Freq: Once | ORAL | Status: AC
  Administered 2012-04-12: 750 mg via ORAL
  Filled 2012-04-12: qty 1

## 2012-04-12 MED ORDER — ALBUTEROL SULFATE HFA 108 (90 BASE) MCG/ACT IN AERS
2.0000 | INHALATION_SPRAY | Freq: Once | RESPIRATORY_TRACT | Status: AC
  Administered 2012-04-12: 2 via RESPIRATORY_TRACT
  Filled 2012-04-12: qty 6.7

## 2012-04-12 MED ORDER — ALBUTEROL SULFATE (5 MG/ML) 0.5% IN NEBU
2.5000 mg | INHALATION_SOLUTION | Freq: Once | RESPIRATORY_TRACT | Status: AC
  Administered 2012-04-12: 2.5 mg via RESPIRATORY_TRACT
  Filled 2012-04-12: qty 0.5

## 2012-04-12 MED ORDER — LEVOFLOXACIN 500 MG PO TABS: 750.0000 mg | ORAL_TABLET | Freq: Every day | ORAL | Status: DC

## 2012-04-12 NOTE — ED Provider Notes (Signed)
History     CSN: [id number]  Arrival date & time 04/12/12  1757   First MD Initiated Contact with Patient 04/12/12 1834      Chief Complaint  Patient presents with  . Shortness of Breath    (Consider location/radiation/quality/duration/timing/severity/associated sxs/prior treatment) HPI Comments: Patient is a 56 year old male who was transferred here from Urgent Care for evaluation of his chest pain and SOB. Patient reports a 1 week history of generalizes chest pain that started gradually and progressively worsened. Patient reports constant generalized chest tightness that is moderate and does not radiate. Patient reports associated SOB and productive cough with green/yellow sputum. He denies any other associated symptoms. No aggravating/alleviating factors. He has not tried anything for symptoms.    Past Medical History  Diagnosis Date  . Renal disorder   . Hepatitis C   . COPD (chronic obstructive pulmonary disease)     On inhaled steroids  . Diabetes mellitus     Past Surgical History  Procedure Date  . Total hip arthroplasty     right  . Cholecystectomy 08/01/2011    Procedure: LAPAROSCOPIC CHOLECYSTECTOMY WITH INTRAOPERATIVE CHOLANGIOGRAM;  Surgeon: Robyne Askew, MD;  Location: [facility] OR;  Service: General;  Laterality: N/A;    History reviewed. No pertinent family history.  History  Substance Use Topics  . Smoking status: Current Every Day Smoker  . Smokeless tobacco: Not on file  . Alcohol Use: No      Review of Systems  Respiratory: Positive for cough and shortness of breath.   Cardiovascular: Positive for chest pain.  All other systems reviewed and are negative.    Allergies  Review of patient's allergies indicates no known allergies.  Home Medications   Current Outpatient Rx  Name  Route  Sig  Dispense  Refill  . ACETAMINOPHEN 500 MG PO TABS   Oral   Take 1,000 mg by mouth every 8 (eight) hours as needed. For pain         . ALBUTEROL SULFATE  (5 MG/ML) 0.5% IN NEBU   Nebulization   Take 2.5 mg by nebulization daily as needed. For wheezing/sob         . BUDESONIDE-FORMOTEROL FUMARATE 160-4.5 MCG/ACT IN AERO   Inhalation   Inhale 2 puffs into the lungs 2 (two) times daily as needed. For shortness of breath         . VITAMIN D 1000 UNITS PO TABS   Oral   Take 1,000 Units by mouth 2 (two) times daily after a meal.         . CYANOCOBALAMIN 500 MCG PO TABS   Oral   Take 1,000 mcg by mouth daily.         . OMEGA-3 FATTY ACIDS 1000 MG PO CAPS   Oral   Take 2 g by mouth every 8 (eight) hours.         Marland Kitchen FLUNISOLIDE 25 MCG/ACT (0.025%) NA SOLN   Each Nare   Place 2 sprays into both nostrils daily.         Marland Kitchen MUCINEX PO   Oral   Take 10 mLs by mouth every 4 (four) hours.         . INSULIN ASPART 100 UNIT/ML [place] SOLN   Subcutaneous   Inject 0-15 Units into the skin 3 (three) times daily before meals. SLIDING SCALE INSULIN    CBG < 70: Drink juice; CBG 70 - 120: 0 units: CBG 121 - 150: 2 units;  CBG 151 - 200: 3 units; CBG 201 - 250: 5 units; CBG 251 - 300: 8 units;CBG 301 - 350: 11 units; CBG 351 - 400: 15 units; CBG > 400 : 15 units and NOTIFY MD   1 vial      . INSULIN GLARGINE 100 UNIT/ML [place] SOLN   Subcutaneous   Inject 45 Units into the skin at bedtime.   10 mL      . OMEPRAZOLE 20 MG PO CPDR   Oral   Take 40 mg by mouth daily.         . TRAMADOL HCL 50 MG PO TABS   Oral   Take 25-50 mg by mouth every 8 (eight) hours as needed. Combine with acetaminophen as prescription states.           BP 146/89  Pulse 87  Temp 97.9 F (36.6 C) (Oral)  Resp 20  SpO2 93%  Physical Exam  Nursing note and vitals reviewed. Constitutional: He is oriented to person, place, and time. He appears well-developed and well-nourished. No distress.  HENT:  Head: Normocephalic and atraumatic.  Mouth/Throat: Oropharynx is clear and moist. No oropharyngeal exudate.  Eyes: Conjunctivae normal and EOM are normal. No  scleral icterus.  Neck: Normal range of motion. Neck supple.  Cardiovascular: Normal rate and regular rhythm.  Exam reveals no gallop and no friction rub.   No murmur heard. Pulmonary/Chest: Effort normal. He has wheezes. He has no rales. He exhibits no tenderness.       Expiratory wheezes noted throughout bilateral lung fields.   Abdominal: Soft. He exhibits no distension. There is no tenderness. There is no rebound and no guarding.  Musculoskeletal: Normal range of motion.  Neurological: He is alert and oriented to person, place, and time. Coordination normal.       Speech is goal-oriented. Moves limbs without ataxia.   Skin: Skin is warm and dry. He is not diaphoretic.  Psychiatric: He has a normal mood and affect. His behavior is normal.    ED Course  Procedures (including critical care time)  Labs Reviewed  CBC WITH DIFFERENTIAL - Abnormal; Notable for the following:    Neutrophils Relative 28 (*)     Lymphocytes Relative 60 (*)     Lymphs Abs 5.8 (*)     All other components within normal limits  COMPREHENSIVE METABOLIC PANEL - Abnormal; Notable for the following:    Total Protein 9.0 (*)     All other components within normal limits  POCT I-STAT TROPONIN I   Dg Chest 2 View  04/12/2012  *RADIOLOGY REPORT*  Clinical Data: Chest pain and shortness of breath  CHEST - 2 VIEW  Comparison: 07/25/2011  Findings: Lung volumes are low. Cardiopericardial silhouette is at upper limits of normal for size. Interstitial markings are diffusely coarsened with chronic features.  No overt pulmonary edema. Patchy atelectasis or infiltrate is seen at the left costophrenic angle.  No pleural effusion. Imaged bony structures of the thorax are intact.  IMPRESSION: Patchy atelectasis or infiltrate at the left base.  Otherwise stable exam.   Original Report Authenticated By: Kennith Center, M.D.      1. CAP (community acquired pneumonia)       MDM  7:58 PM Patient will have nebulizer treatment  and a dose of Levaquin here.   8:17 PM Nebulizer done and antibiotics given. Patient feeling better. I will discharge him with a prescription for antibiotics and an albuterol inhaler. Labs unremarkable. No further evaluation needed  at this time.       Emilia Beck, PA-C 04/12/12 2024

## 2012-04-12 NOTE — ED Provider Notes (Signed)
Medical screening examination/treatment/procedure(s) were performed by non-physician practitioner and as supervising physician I was immediately available for consultation/collaboration.   Flint Melter, MD 04/12/12 [phone]

## 2012-04-12 NOTE — ED Notes (Signed)
Attempted IV start x 2 w/o success 

## 2012-04-12 NOTE — ED Notes (Signed)
Per ems- pt c/o sob x1 wk. Pt from [facility] urgent care. Pt has productive cough with yellow/green sputum. Pt has diminished breath sounds to right lung fields. 20g IV LFA started by[facility]. BP-130/90 HR-85, NSR R-18 O2-96% on RA

## 2012-04-12 NOTE — ED Notes (Addendum)
EKG performed at 1809 and old EKG placed in chart

## 2012-04-12 NOTE — ED Provider Notes (Signed)
History     CSN: [id number]  Arrival date & time 04/12/12  1646   First MD Initiated Contact with Patient 04/12/12 1654      Chief Complaint  Patient presents with  . Cough    cough and congestion since yesterday. having sob with lying down x 1 wk.    (Consider location/radiation/quality/duration/timing/severity/associated sxs/prior treatment) HPI Comments: Patient presents urgent care complaining of increasing shortness of breath for about a week. He describes been coughing more and at this point he is unable to tolerate being flat as he gets short of breath with minimal movement or activities or walking. He denies any fevers but does describe a cough that is productive of a pinkish looking phlegm. He has had some chest pains in the past but does not have any now. He denies any upper congestion such as a runny nose or recent fevers. Does have some abdominal discomfort (patient point to epigastric area).   Patient is a 56 y.o. male presenting with cough. The history is provided by the patient.  Cough This is a new problem. The current episode started more than 1 week ago. The problem occurs constantly. The cough is productive of sputum. There has been no fever. Associated symptoms include chest pain and shortness of breath. Pertinent negatives include no chills, no headaches and no wheezing.    Past Medical History  Diagnosis Date  . Renal disorder   . Hepatitis C   . COPD (chronic obstructive pulmonary disease)     On inhaled steroids  . Diabetes mellitus     Past Surgical History  Procedure Date  . Total hip arthroplasty     right  . Cholecystectomy 08/01/2011    Procedure: LAPAROSCOPIC CHOLECYSTECTOMY WITH INTRAOPERATIVE CHOLANGIOGRAM;  Surgeon: Robyne Askew, MD;  Location: [facility];  Service: General;  Laterality: N/A;    History reviewed. No pertinent family history.  History  Substance Use Topics  . Smoking status: Current Every Day Smoker  . Smokeless tobacco: Not  on file  . Alcohol Use: No      Review of Systems  Constitutional: Positive for fatigue. Negative for fever, chills, diaphoresis, activity change, appetite change and unexpected weight change.  HENT: Negative for neck pain and neck stiffness.   Eyes: Negative for visual disturbance.  Respiratory: Positive for cough and shortness of breath. Negative for chest tightness and wheezing.   Cardiovascular: Positive for chest pain. Negative for palpitations.  Skin: Negative for pallor.  Neurological: Negative for dizziness and headaches.    Allergies  Review of patient's allergies indicates no known allergies.  Home Medications   Current Outpatient Rx  Name  Route  Sig  Dispense  Refill  . ALBUTEROL SULFATE (5 MG/ML) 0.5% IN NEBU   Nebulization   Take 0.5 mLs (2.5 mg total) by nebulization every 3 (three) hours as needed for wheezing or shortness of breath.   20 mL      . CALCIUM 600 + D PO   Oral   Take 3 tablets by mouth daily.         Marland Kitchen FLUTICASONE PROPIONATE 50 MCG/ACT NA SUSP   Nasal   Place 2 sprays into the nose daily.         Marland Kitchen FLUTICASONE-SALMETEROL 250-50 MCG/DOSE IN AEPB   Inhalation   Inhale 1 puff into the lungs every 12 (twelve) hours.         . INSULIN ASPART 100 UNIT/ML  SOLN   Subcutaneous   Inject  4 Units into the skin 3 (three) times daily with meals.   1 vial      . INSULIN ASPART 100 UNIT/ML [place] SOLN   Subcutaneous   Inject 0-15 Units into the skin 3 (three) times daily before meals. SLIDING SCALE INSULIN    CBG < 70: Drink juice; CBG 70 - 120: 0 units: CBG 121 - 150: 2 units; CBG 151 - 200: 3 units; CBG 201 - 250: 5 units; CBG 251 - 300: 8 units;CBG 301 - 350: 11 units; CBG 351 - 400: 15 units; CBG > 400 : 15 units and NOTIFY MD   1 vial      . INSULIN GLARGINE 100 UNIT/ML [place] SOLN   Subcutaneous   Inject 45 Units into the skin at bedtime.   10 mL      . MAGNESIUM 250 MG PO TABS   Oral   Take 1 tablet by mouth 2 (two) times daily.          . MOMETASONE FURO-FORMOTEROL FUM 200-5 MCG/ACT IN AERO   Inhalation   Inhale 2 puffs into the lungs 2 (two) times daily.         Marland Kitchen OMEPRAZOLE 40 MG PO CPDR   Oral   Take 40 mg by mouth daily.         . K PHOS MONO-SOD PHOS DI & MONO 155-852-130 MG PO TABS   Oral   Take 1 tablet by mouth 3 (three) times daily.         . SUCRALFATE 1 G PO TABS   Oral   Take 1 g by mouth 4 (four) times daily.           BP 159/85  Pulse 94  Temp 98.3 F (36.8 C) (Oral)  Resp 24  SpO2 96%  Physical Exam  Nursing note and vitals reviewed. Constitutional: He appears distressed.  Cardiovascular: Regular rhythm.        Somewhat attenuated heart sounds no obvious gallop and  Pulmonary/Chest: Accessory muscle usage present. Tachypnea noted. He is in respiratory distress. He has decreased breath sounds. He has rales.      ED Course  Procedures (including critical care time)  Labs Reviewed - No data to display No results found.   1. Dyspnea     EKG performed at 16 hours. Reveal a ventricular rate of 93 beats per minute normal sinus rhythm with biatrial enlargement. No ST or T. wave segment and abnormalities to suggest acute ischemia    MDM  Dyspnea- patient is being transferred to the emergency department on the clinical impression that this dyspnea is related originated from an acute cardiovascular condition such as CHF. Patient is in normal sinus rhythm, but symptomatic. IV insertion without IV fluids. The monitor while we wait for transport.       Jimmie Molly, MD 04/12/12 [phone]

## 2012-04-12 NOTE — ED Notes (Addendum)
Pt c/o cough and congestion since yesterday. Pt also states that he is having trouble sleeping due to sob breathe when lying down.  Pt also c/o chest pains that has been present for the past week but yesterday and today have been the worse.

## 2012-08-22 ENCOUNTER — Other Ambulatory Visit: Payer: Self-pay | Admitting: Adult Health

## 2012-10-15 ENCOUNTER — Emergency Department (HOSPITAL_COMMUNITY)
Admission: EM | Admit: 2012-10-15 | Discharge: 2012-10-15 | Disposition: A | Discharge status: Home / Self Care | Payer: Medicaid Other | Source: Home / Self Care | Attending: Emergency Medicine | Admitting: Emergency Medicine

## 2012-10-15 ENCOUNTER — Encounter (HOSPITAL_COMMUNITY): Payer: Self-pay | Admitting: Emergency Medicine

## 2012-10-15 ENCOUNTER — Emergency Department (INDEPENDENT_AMBULATORY_CARE_PROVIDER_SITE_OTHER): Payer: Medicaid Other

## 2012-10-15 DIAGNOSIS — J440 Chronic obstructive pulmonary disease with acute lower respiratory infection: Secondary | ICD-10-CM

## 2012-10-15 DIAGNOSIS — J019 Acute sinusitis, unspecified: Secondary | ICD-10-CM

## 2012-10-15 DIAGNOSIS — J209 Acute bronchitis, unspecified: Secondary | ICD-10-CM

## 2012-10-15 LAB — POCT I-STAT, CHEM 8
Calcium, Ion: 1.24 mmol/L — ABNORMAL HIGH (ref 1.12–1.23)
Glucose, Bld: 112 mg/dL — ABNORMAL HIGH (ref 70–99)
HCT: 48 % (ref 39.0–52.0)
TCO2: 26 mmol/L (ref 0–100)

## 2012-10-15 MED ORDER — METHYLPREDNISOLONE ACETATE 80 MG/ML IJ SUSP
80.0000 mg | Freq: Once | INTRAMUSCULAR | Status: AC
  Administered 2012-10-15: 80 mg via INTRAMUSCULAR

## 2012-10-15 MED ORDER — HYDROCODONE-ACETAMINOPHEN 5-325 MG PO TABS
1.0000 | ORAL_TABLET | Freq: Once | ORAL | Status: AC
  Administered 2012-10-15: 1 via ORAL

## 2012-10-15 MED ORDER — HYDROCODONE-ACETAMINOPHEN 5-325 MG PO TABS
ORAL_TABLET | ORAL | Status: AC
  Filled 2012-10-15: qty 1

## 2012-10-15 MED ORDER — FLUTICASONE PROPIONATE 50 MCG/ACT NA SUSP: 2.0000 | Freq: Every day | NASAL | Status: DC

## 2012-10-15 MED ORDER — METHYLPREDNISOLONE ACETATE 80 MG/ML IJ SUSP
INTRAMUSCULAR | Status: AC
  Filled 2012-10-15: qty 1

## 2012-10-15 MED ORDER — PREDNISONE 20 MG PO TABS: 20.0000 mg | ORAL_TABLET | Freq: Two times a day (BID) | ORAL | Status: DC

## 2012-10-15 MED ORDER — AMOXICILLIN-POT CLAVULANATE 875-125 MG PO TABS: 1.0000 | ORAL_TABLET | Freq: Two times a day (BID) | ORAL | Status: DC

## 2012-10-15 NOTE — ED Notes (Signed)
C/o sinus pain and pressure, coughing up yellow stuff, has some wheezing at night with cold chills.  Pt has taken some Mucinex with little relief.  Pt denies fever.  Pt is oriented and in no acute distress.  Leilani Able CMA Student

## 2012-10-15 NOTE — ED Provider Notes (Signed)
Chief Complaint:   Chief Complaint  Patient presents with  . Facial Pain    History of Present Illness:   Ethan Jordan is a 57 year old male with multiple medical problems including hepatitis C, diabetes, and a history of alcohol and drug abuse. He presents with a one-week history of cough, nasal congestion, and chest pain.  The cough is productive of yellow sputum and has been associated with some wheezing. He has a history of asthma and COPD and still admits to smoking 3-4 cigarettes per day. He denies any fever, chills, sweats. He also has had a one-week history of chest pain. This is substernal and constant, rated 8/10 in intensity. It's worse if he coughs. He also has had a one-week history of nasal congestion with bloody drainage, postnasal drip, headache, left facial pain, sore throat, and chills. He has a history of pneumonia about a year ago.  Review of Systems:  Other than noted above, the patient denies any of the following symptoms: Systemic:  No fevers, chills, sweats, weight loss or gain, or fatigue. ENT:  No nasal congestion, sneezing, itching, postnasal drip, sinus pressure, headache, sore throat, or hoarseness. Lungs:  No wheezing, shortness of breath, chest tightness or congestion. Heart:  No chest pain, tightness, pressure, PND, orthopnea, or ankle edema. GI:  No indigestion, heartburn, waterbrash, burping, abdominal pain, nausea, or vomiting.  PMFSH:  Past medical history, family history, social history, meds, and allergies were reviewed.  Specifically, there is no history of asthma, allergies, reflux esophagitis or cigarette smoking. He has a history of kidney disorder, hepatitis C, COPD, and diabetes. He takes albuterol, Symbicort, Lantus insulin, NovoLog insulin, Prilosec, tramadol.  Physical Exam:   Vital signs:  Pulse 72  Temp(Src) 98.9 F (37.2 C) (Oral)  Resp 16  SpO2 100% General:  Alert and oriented.  In no distress.  Skin warm and dry. ENT: TMs and ear canals  normal.  Nasal mucosa normal, without drainage.  Pharynx clear without exudate or drainage.  No intraoral lesions. Neck:  No adenopathy, tenderness or mass.  No JVD. Lungs:  No respiratory distress.  Breath sounds clear and equal bilaterally.  No wheezes, rales or rhonchi. Heart:  Regular rhythm, no gallops or murmers.  No pedal edema. Abdomon:  Soft and nontender.  No organomegaly or mass.  Radiology:  Dg Sinuses Complete  10/15/2012   *RADIOLOGY REPORT*  Clinical Data: Facial pressure on the left.  PARANASAL SINUSES - COMPLETE 3 + VIEW  Comparison: CT head without contrast 07/19/2011.  Findings: The left maxillary sinus is opacified.  The patient is edentulous.  The frontal sinuses are at least partially opacified.  IMPRESSION:  1.  Left maxillary sinus opacification. 2.  Probable frontal sinus opacification.   Original Report Authenticated By: Marin Roberts, M.D.   Dg Chest 2 View  10/15/2012   *RADIOLOGY REPORT*  Clinical Data: Chest pain, productive cough  CHEST - 2 VIEW  Comparison: Prior radiograph from 04/12/2012  Findings: Cardiac and mediastinal silhouettes are within normal limits, and are stable as compared to the prior exam.  The lungs are normally inflated.  No airspace consolidation, pleural effusion, or pulmonary edema.  No acute osseous abnormality.  IMPRESSION: No acute cardiopulmonary process.   Original Report Authenticated By: Rise Mu, M.D.    Results for orders placed during the hospital encounter of 10/15/12  POCT I-STAT, CHEM 8      Result Value Range   Sodium 142  135 - 145 mEq/L   Potassium 4.7  3.5 - 5.1 mEq/L   Chloride 105  96 - 112 mEq/L   BUN 12  6 - 23 mg/dL   Creatinine, Ser 1.61  0.50 - 1.35 mg/dL   Glucose, Bld 096 (*) 70 - 99 mg/dL   Calcium, Ion 0.45 (*) 1.12 - 1.23 mmol/L   TCO2 26  0 - 100 mmol/L   Hemoglobin 16.3  13.0 - 17.0 g/dL   HCT 40.9  81.1 - 91.4 %    EKG Results:  Date: 10/15/2012  Rate: 70  Rhythm: normal sinus rhythm   QRS Axis: normal  Intervals: normal  ST/T Wave abnormalities: normal  Conduction Disutrbances:none  Narrative Interpretation: Normal sinus rhythm, moderate voltage criteria for LVH, otherwise normal.  Old EKG Reviewed: none available  Course in Urgent Care Center:   He was given Norco 5/325 one tablet for pain, and Depo-Medrol 80 mg IM.   Assessment:  The primary encounter diagnosis was COPD (chronic obstructive pulmonary disease) with acute bronchitis. A diagnosis of Acute sinusitis was also pertinent to this visit.  He has a mild exacerbation of his COPD and he has an albuterol nebulizer and Symbicort at home that he can take Augmentin and prednisone were added to his regimen, he was told to followup with his primary care doctor here in [place] or at the [place] if he should get worse or fail to improve. He also has had sinusitis and was given Augmentin and Flonase for this.  Plan:   1.  The following meds were prescribed:   Discharge Medication List as of 10/15/2012  5:10 PM    START taking these medications   Details  amoxicillin-clavulanate (AUGMENTIN) 875-125 MG per tablet Take 1 tablet by mouth 2 (two) times daily., Starting 10/15/2012, Until Discontinued, Normal    fluticasone (FLONASE) 50 MCG/ACT nasal spray Place 2 sprays into the nose daily., Starting 10/15/2012, Until Discontinued, Normal    predniSONE (DELTASONE) 20 MG tablet Take 1 tablet (20 mg total) by mouth 2 (two) times daily., Starting 10/15/2012, Until Discontinued, Normal       2.  The patient was instructed in symptomatic care and handouts were given. 3.  The patient was told to return if becoming worse in any way, if no better in 3 or 4 days, and given some red flag symptoms such as fever, worsening pain, or difficulty breathing that would indicate earlier return. 4.  Follow up here, with his primary care physician, or at the [place] as needed.     Reuben Likes, MD 10/15/12 [phone]

## 2013-03-22 ENCOUNTER — Encounter (HOSPITAL_COMMUNITY): Payer: Self-pay | Admitting: Emergency Medicine

## 2013-03-22 ENCOUNTER — Emergency Department (HOSPITAL_COMMUNITY): Payer: Medicaid Other

## 2013-03-22 ENCOUNTER — Emergency Department (HOSPITAL_COMMUNITY)
Admission: EM | Admit: 2013-03-22 | Discharge: 2013-03-22 | Disposition: A | Discharge status: Home / Self Care | Payer: Medicaid Other | Attending: Emergency Medicine | Admitting: Emergency Medicine

## 2013-03-22 ENCOUNTER — Other Ambulatory Visit: Payer: Self-pay

## 2013-03-22 ENCOUNTER — Emergency Department (HOSPITAL_COMMUNITY)
Admission: EM | Admit: 2013-03-22 | Discharge: 2013-03-22 | Disposition: A | Discharge status: Nursing Home (Includes ICF) | Payer: Medicaid Other | Source: Home / Self Care | Attending: Emergency Medicine | Admitting: Emergency Medicine

## 2013-03-22 DIAGNOSIS — J329 Chronic sinusitis, unspecified: Secondary | ICD-10-CM

## 2013-03-22 DIAGNOSIS — Z8619 Personal history of other infectious and parasitic diseases: Secondary | ICD-10-CM | POA: Insufficient documentation

## 2013-03-22 DIAGNOSIS — J449 Chronic obstructive pulmonary disease, unspecified: Secondary | ICD-10-CM

## 2013-03-22 DIAGNOSIS — R1084 Generalized abdominal pain: Secondary | ICD-10-CM | POA: Insufficient documentation

## 2013-03-22 DIAGNOSIS — R1013 Epigastric pain: Secondary | ICD-10-CM

## 2013-03-22 DIAGNOSIS — Z79899 Other long term (current) drug therapy: Secondary | ICD-10-CM | POA: Insufficient documentation

## 2013-03-22 DIAGNOSIS — J4489 Other specified chronic obstructive pulmonary disease: Secondary | ICD-10-CM | POA: Insufficient documentation

## 2013-03-22 DIAGNOSIS — Z87448 Personal history of other diseases of urinary system: Secondary | ICD-10-CM | POA: Insufficient documentation

## 2013-03-22 DIAGNOSIS — F17200 Nicotine dependence, unspecified, uncomplicated: Secondary | ICD-10-CM | POA: Insufficient documentation

## 2013-03-22 DIAGNOSIS — J189 Pneumonia, unspecified organism: Secondary | ICD-10-CM

## 2013-03-22 DIAGNOSIS — E119 Type 2 diabetes mellitus without complications: Secondary | ICD-10-CM | POA: Insufficient documentation

## 2013-03-22 DIAGNOSIS — J159 Unspecified bacterial pneumonia: Secondary | ICD-10-CM | POA: Insufficient documentation

## 2013-03-22 DIAGNOSIS — Z794 Long term (current) use of insulin: Secondary | ICD-10-CM | POA: Insufficient documentation

## 2013-03-22 LAB — COMPREHENSIVE METABOLIC PANEL
ALT: 85 U/L — ABNORMAL HIGH (ref 0–53)
AST: 141 U/L — ABNORMAL HIGH (ref 0–37)
Albumin: 3.4 g/dL — ABNORMAL LOW (ref 3.5–5.2)
CO2: 28 mEq/L (ref 19–32)
Calcium: 9.1 mg/dL (ref 8.4–10.5)
Creatinine, Ser: 0.7 mg/dL (ref 0.50–1.35)
Sodium: 136 mEq/L (ref 135–145)
Total Protein: 9 g/dL — ABNORMAL HIGH (ref 6.0–8.3)

## 2013-03-22 LAB — CBC WITH DIFFERENTIAL/PLATELET
Basophils Absolute: 0 10*3/uL (ref 0.0–0.1)
Eosinophils Absolute: 0 10*3/uL (ref 0.0–0.7)
HCT: 45.3 % (ref 39.0–52.0)
Lymphocytes Relative: 49 % — ABNORMAL HIGH (ref 12–46)
Lymphs Abs: 2.6 10*3/uL (ref 0.7–4.0)
MCHC: 34.9 g/dL (ref 30.0–36.0)
MCV: 103.4 fL — ABNORMAL HIGH (ref 78.0–100.0)
Monocytes Relative: 31 % — ABNORMAL HIGH (ref 3–12)
Neutro Abs: 1.1 10*3/uL — ABNORMAL LOW (ref 1.7–7.7)
Platelets: 120 10*3/uL — ABNORMAL LOW (ref 150–400)
RDW: 13.1 % (ref 11.5–15.5)
WBC: 5.3 10*3/uL (ref 4.0–10.5)

## 2013-03-22 LAB — CG4 I-STAT (LACTIC ACID): Lactic Acid, Venous: 1.46 mmol/L (ref 0.5–2.2)

## 2013-03-22 LAB — URINALYSIS, ROUTINE W REFLEX MICROSCOPIC
Glucose, UA: NEGATIVE mg/dL
Hgb urine dipstick: NEGATIVE
Protein, ur: NEGATIVE mg/dL
Specific Gravity, Urine: 1.025 (ref 1.005–1.030)
pH: 6.5 (ref 5.0–8.0)

## 2013-03-22 LAB — URINE MICROSCOPIC-ADD ON

## 2013-03-22 MED ORDER — SODIUM CHLORIDE 0.9 % IV SOLN
1000.0000 mL | Freq: Once | INTRAVENOUS | Status: AC
  Administered 2013-03-22: 1000 mL via INTRAVENOUS

## 2013-03-22 MED ORDER — ONDANSETRON HCL 4 MG/2ML IJ SOLN
4.0000 mg | Freq: Once | INTRAMUSCULAR | Status: AC
  Administered 2013-03-22: 4 mg via INTRAVENOUS
  Filled 2013-03-22: qty 2

## 2013-03-22 MED ORDER — LEVOFLOXACIN 500 MG PO TABS: 500.0000 mg | ORAL_TABLET | Freq: Every day | ORAL | Status: AC

## 2013-03-22 MED ORDER — PREDNISONE 20 MG PO TABS
60.0000 mg | ORAL_TABLET | ORAL | Status: AC
  Administered 2013-03-22: 60 mg via ORAL
  Filled 2013-03-22: qty 3

## 2013-03-22 MED ORDER — HYDROMORPHONE HCL PF 1 MG/ML IJ SOLN
0.5000 mg | Freq: Once | INTRAMUSCULAR | Status: AC
  Administered 2013-03-22: 0.5 mg via INTRAVENOUS
  Filled 2013-03-22: qty 1

## 2013-03-22 MED ORDER — PREDNISONE 20 MG PO TABS: 40.0000 mg | ORAL_TABLET | ORAL | Status: AC

## 2013-03-22 MED ORDER — LEVOFLOXACIN 500 MG PO TABS
500.0000 mg | ORAL_TABLET | Freq: Once | ORAL | Status: AC
  Administered 2013-03-22: 500 mg via ORAL
  Filled 2013-03-22: qty 1

## 2013-03-22 MED ORDER — HYDROCOD POLST-CHLORPHEN POLST 10-8 MG/5ML PO LQCR: 5.0000 mL | Freq: Two times a day (BID) | ORAL | Status: DC | PRN

## 2013-03-22 NOTE — ED Notes (Signed)
Pt c/o cold sxs onset 2 days Sxs include: facial pressure, HA, abd pain... sxs increases every time he coughs up green phlegm Sxs also include: chills, congestion, wheezing Denies: f/v/n/d.... Smokes 0.5 PPD BP today is 144/108... Denies: CP, SOB, edema, diaphoresis Alert w/no signs of acute distress.

## 2013-03-22 NOTE — ED Notes (Signed)
Pt still in x-ray

## 2013-03-22 NOTE — ED Provider Notes (Signed)
CSN: [id number]     Arrival date & time 03/22/13  1100 History   First MD Initiated Contact with Patient 03/22/13 1211     Chief Complaint  Patient presents with  . URI   (Consider location/radiation/quality/duration/timing/severity/associated sxs/prior Treatment) HPI Comments: 57 year old male presents complaining of facial pain and pressure, headache, cough, and severe abdominal pain. This started 2 days ago, and the abdominal pain started yesterday. His facial pain and pressure is getting worse, and his abdominal pain is much worse whenever he coughs. The abdominal pain is in the epigastrium and feels like previous episodes of pancreatitis. He has subjective fever as well. His abdominal pain is not necessarily affected by eating, but he hasn't eaten much in the last 2 days due to lack of appetite. He is not feeling short of breath. No chest pain, NVD.    Patient is a 57 y.o. male presenting with URI.  URI Presenting symptoms: cough and fever   Presenting symptoms: no fatigue and no sore throat   Associated symptoms: no arthralgias, no myalgias and no neck pain     Past Medical History  Diagnosis Date  . Renal disorder   . Hepatitis C   . COPD (chronic obstructive pulmonary disease)     On inhaled steroids  . Diabetes mellitus    Past Surgical History  Procedure Laterality Date  . Total hip arthroplasty      right  . Cholecystectomy  08/01/2011    Procedure: LAPAROSCOPIC CHOLECYSTECTOMY WITH INTRAOPERATIVE CHOLANGIOGRAM;  Surgeon: Robyne Askew, MD;  Location: [facility] OR;  Service: General;  Laterality: N/A;   No family history on file. History  Substance Use Topics  . Smoking status: Current Every Day Smoker  . Smokeless tobacco: Not on file  . Alcohol Use: No    Review of Systems  Constitutional: Positive for fever. Negative for chills and fatigue.  HENT: Positive for sinus pressure. Negative for sore throat.   Eyes: Negative for visual disturbance.  Respiratory: Positive  for cough. Negative for shortness of breath.   Cardiovascular: Negative for chest pain, palpitations and leg swelling.  Gastrointestinal: Positive for abdominal pain. Negative for nausea, vomiting, diarrhea and constipation.  Genitourinary: Negative for dysuria, urgency, frequency and hematuria.  Musculoskeletal: Negative for arthralgias, myalgias, neck pain and neck stiffness.  Skin: Negative for rash.  Neurological: Negative for dizziness, weakness and light-headedness.    Allergies  Review of patient's allergies indicates no known allergies.  Home Medications   Current Outpatient Rx  Name  Route  Sig  Dispense  Refill  . acetaminophen (TYLENOL) 500 MG tablet   Oral   Take 1,000 mg by mouth every 8 (eight) hours as needed. For pain         . EXPIRED: albuterol (PROVENTIL) (5 MG/ML) 0.5% nebulizer solution   Nebulization   Take 2.5 mg by nebulization daily as needed. For wheezing/sob         . amoxicillin-clavulanate (AUGMENTIN) 875-125 MG per tablet   Oral   Take 1 tablet by mouth 2 (two) times daily.   20 tablet   0   . budesonide-formoterol (SYMBICORT) 160-4.5 MCG/ACT inhaler   Inhalation   Inhale 2 puffs into the lungs 2 (two) times daily as needed. For shortness of breath         . cholecalciferol (VITAMIN D) 1000 UNITS tablet   Oral   Take 1,000 Units by mouth 2 (two) times daily after a meal.         .  cyanocobalamin 500 MCG tablet   Oral   Take 1,000 mcg by mouth daily.         . fish oil-omega-3 fatty acids 1000 MG capsule   Oral   Take 2 g by mouth every 8 (eight) hours.         . flunisolide (NASALIDE) 25 MCG/ACT (0.025%) SOLN   Each Nare   Place 2 sprays into both nostrils daily.         . fluticasone (FLONASE) 50 MCG/ACT nasal spray   Nasal   Place 2 sprays into the nose daily.   16 g   0   . GuaiFENesin (MUCINEX PO)   Oral   Take 10 mLs by mouth every 4 (four) hours.         Marland Kitchen EXPIRED: insulin aspart (NOVOLOG) 100 UNIT/ML  injection   Subcutaneous   Inject 0-15 Units into the skin 3 (three) times daily before meals. SLIDING SCALE INSULIN    CBG < 70: Drink juice; CBG 70 - 120: 0 units: CBG 121 - 150: 2 units; CBG 151 - 200: 3 units; CBG 201 - 250: 5 units; CBG 251 - 300: 8 units;CBG 301 - 350: 11 units; CBG 351 - 400: 15 units; CBG > 400 : 15 units and NOTIFY MD   1 vial      . EXPIRED: insulin glargine (LANTUS) 100 UNIT/ML injection   Subcutaneous   Inject 45 Units into the skin at bedtime.   10 mL      . levofloxacin (LEVAQUIN) 500 MG tablet   Oral   Take 1.5 tablets (750 mg total) by mouth daily.   8 tablet   0   . omeprazole (PRILOSEC) 20 MG capsule   Oral   Take 40 mg by mouth daily.         . predniSONE (DELTASONE) 20 MG tablet   Oral   Take 1 tablet (20 mg total) by mouth 2 (two) times daily.   10 tablet   0   . traMADol (ULTRAM) 50 MG tablet   Oral   Take 25-50 mg by mouth every 8 (eight) hours as needed. Combine with acetaminophen as prescription states.          BP 144/108  Pulse 93  Temp(Src) 99 F (37.2 C) (Oral)  Resp 24  SpO2 94% Physical Exam  Nursing note and vitals reviewed. Constitutional: He is oriented to person, place, and time. He appears well-developed and well-nourished. No distress.  HENT:  Head: Normocephalic and atraumatic.  Nose: Right sinus exhibits frontal sinus tenderness. Right sinus exhibits no maxillary sinus tenderness. Left sinus exhibits frontal sinus tenderness. Left sinus exhibits no maxillary sinus tenderness.  Neck: Normal range of motion. Neck supple.  Cardiovascular: Normal rate, regular rhythm and normal heart sounds.   Pulmonary/Chest: Effort normal and breath sounds normal. No respiratory distress. He has no wheezes. He has no rales.  Abdominal: He exhibits distension. There is no hepatosplenomegaly. There is tenderness in the epigastric area. There is no rigidity, no rebound, no guarding, no CVA tenderness, no tenderness at McBurney's  point and negative Murphy's sign. A hernia is present. Hernia confirmed positive in the ventral area.  Lymphadenopathy:    He has no cervical adenopathy.  Neurological: He is alert and oriented to person, place, and time. Coordination normal.  Skin: Skin is warm and dry. No rash noted. He is not diaphoretic.  Psychiatric: He has a normal mood and affect. Judgment normal.    ED  Course  Procedures (including critical care time) Labs Review Labs Reviewed - No data to display Imaging Review No results found.    MDM   1. Sinusitis   2. Abdominal pain, epigastric   3. COPD (chronic obstructive pulmonary disease)    Given this patient's history of pancreatitis and the severity of the abdominal pain, transferred to ED for further workup of abdominal pain.  Graylon Good, PA-C 03/22/13 1326

## 2013-03-22 NOTE — ED Notes (Signed)
[place] notified pt is ready- sts patient has been sent for

## 2013-03-22 NOTE — ED Provider Notes (Signed)
CSN: [id number]     Arrival date & time 03/22/13  1349 History   First MD Initiated Contact with Patient 03/22/13 1501     Chief Complaint  Patient presents with  . Headache  . Abdominal Pain  . Cough   (Consider location/radiation/quality/duration/timing/severity/associated sxs/prior Treatment) HPI Patient presents with concerns of ongoing facial discomfort, cough.  In addition, patient is concerned of ongoing generalized discomfort.  Over the past 2 days he developed these symptoms, without clear precipitant.  The pressure is diffuse, frontal.  There is no new visual change, confusion, disorientation. Patient does complain of chills, denies objective fever. Cough is persistent, with some relief with Tussionex. The patient complains of epigastric discomfort associated with coughing, otherwise no abdominal pain, no chest pain, dyspnea. Patient does not have a history of pancreatitis. He does have history of COPD, diabetes. He denies history of heart disease. He had gallbladder surgery one year ago. He was transferred here from urgent care after being evaluated there initially. Past Medical History  Diagnosis Date  . Renal disorder   . Hepatitis C   . COPD (chronic obstructive pulmonary disease)     On inhaled steroids  . Diabetes mellitus    Past Surgical History  Procedure Laterality Date  . Total hip arthroplasty      right  . Cholecystectomy  08/01/2011    Procedure: LAPAROSCOPIC CHOLECYSTECTOMY WITH INTRAOPERATIVE CHOLANGIOGRAM;  Surgeon: Robyne Askew, MD;  Location: [facility] OR;  Service: General;  Laterality: N/A;   No family history on file. History  Substance Use Topics  . Smoking status: Current Every Day Smoker  . Smokeless tobacco: Not on file  . Alcohol Use: No    Review of Systems  Constitutional:       Per HPI, otherwise negative  HENT:       Per HPI, otherwise negative  Respiratory:       Per HPI, otherwise negative  Cardiovascular:       Per HPI,  otherwise negative  Gastrointestinal: Negative for vomiting.  Endocrine:       Negative aside from HPI  Genitourinary:       Neg aside from HPI   Musculoskeletal:       Per HPI, otherwise negative  Skin: Negative.   Neurological: Negative for syncope.    Allergies  Review of patient's allergies indicates no known allergies.  Home Medications   Current Outpatient Rx  Name  Route  Sig  Dispense  Refill  . budesonide-formoterol (SYMBICORT) 160-4.5 MCG/ACT inhaler   Inhalation   Inhale 2 puffs into the lungs 2 (two) times daily as needed. For shortness of breath         . cholecalciferol (VITAMIN D) 1000 UNITS tablet   Oral   Take 1,000 Units by mouth 2 (two) times daily after a meal.         . cyanocobalamin 500 MCG tablet   Oral   Take 1,000 mcg by mouth daily.         . fish oil-omega-3 fatty acids 1000 MG capsule   Oral   Take 2 g by mouth 2 (two) times daily.          . flunisolide (NASALIDE) 25 MCG/ACT (0.025%) SOLN   Each Nare   Place 2 sprays into both nostrils daily.         Marland Kitchen HYDROcodone-acetaminophen (NORCO/VICODIN) 5-325 MG per tablet   Oral   Take 1 tablet by mouth every 6 (six) hours as needed  for moderate pain.         Marland Kitchen insulin aspart (NOVOLOG) 100 UNIT/ML injection   Subcutaneous   Inject 0-15 Units into the skin 3 (three) times daily before meals. SLIDING SCALE INSULIN    CBG < 70: Drink juice; CBG 70 - 120: 0 units: CBG 121 - 150: 2 units; CBG 151 - 200: 3 units; CBG 201 - 250: 5 units; CBG 251 - 300: 8 units;CBG 301 - 350: 11 units; CBG 351 - 400: 15 units; CBG > 400 : 15 units and NOTIFY MD   1 vial      . insulin glargine (LANTUS) 100 UNIT/ML injection   Subcutaneous   Inject 45 Units into the skin at bedtime.   10 mL      . mometasone-formoterol (DULERA) 200-5 MCG/ACT AERO   Inhalation   Inhale 2 puffs into the lungs 2 (two) times daily.         Marland Kitchen omeprazole (PRILOSEC) 20 MG capsule   Oral   Take 20 mg by mouth daily.           . traMADol (ULTRAM) 50 MG tablet   Oral   Take 50 mg by mouth every 8 (eight) hours as needed for moderate pain.         Marland Kitchen EXPIRED: albuterol (PROVENTIL) (5 MG/ML) 0.5% nebulizer solution   Nebulization   Take 2.5 mg by nebulization daily as needed. For wheezing/sob          BP 144/81  Pulse 77  Temp(Src) 99 F (37.2 C) (Oral)  Resp 20  Ht 5\' 8"  (1.727 m)  Wt 198 lb (89.812 kg)  BMI 30.11 kg/m2  SpO2 95% Physical Exam  Nursing note and vitals reviewed. Constitutional: He is oriented to person, place, and time. He appears well-developed. No distress.  HENT:  Head: Normocephalic and atraumatic.  Nose: No rhinorrhea. No epistaxis.  dentures  Eyes: Conjunctivae and EOM are normal.  Cardiovascular: Normal rate and regular rhythm.   Pulmonary/Chest: Effort normal. No stridor. No respiratory distress.  Abdominal: He exhibits no distension. There is tenderness in the epigastric area and periumbilical area. There is no rigidity, no rebound, no guarding and no CVA tenderness.  Musculoskeletal: He exhibits no edema.  Neurological: He is alert and oriented to person, place, and time.  Skin: Skin is warm and dry.  Psychiatric: He has a normal mood and affect.    ED Course  Procedures (including critical care time) Labs Review Labs Reviewed  CBC WITH DIFFERENTIAL  COMPREHENSIVE METABOLIC PANEL  LIPASE, BLOOD  URINALYSIS, ROUTINE W REFLEX MICROSCOPIC   Imaging Review No results found.  EKG Interpretation   None     after the initial evaluation I reviewed the patient's chart, including evaluation earlier today at urgent care.   7:29 PM Patient states that he feels"pretty good." He denies no complaints.  I discussed all findings with him, including his x-ray suggesting pneumonia.  With his history of COPD, this is a consideration, the patient remains stable appearing, with no hypoxia, tachypnea, tachycardia.  Per his request, the patient will be discharged to  follow up with his primary care physician.  MDM  No diagnosis found. Patient presents with concern of cough, epigastric discomfort, but no chest pain, no pain when there is not coughing.  On exam he is awake alert, afebrile.  Patient remained in no distress throughout his emergency department course, stated he improved here. With reassuring vital signs, labs, but with some concern for  early pneumonia, patient was started on antibiotics, provide steroids and will follow up with his primary care physician.    Gerhard Munch, MD 03/22/13 [phone]

## 2013-03-22 NOTE — ED Provider Notes (Signed)
Medical screening examination/treatment/procedure(s) were performed by non-physician practitioner and as supervising physician I was immediately available for consultation/collaboration.  Leslee Home, M.D.  Reuben Likes, MD 03/22/13 2100

## 2013-03-22 NOTE — ED Notes (Signed)
DR.Lockwood shown results of POC Lactic Acid. ED-Lab

## 2013-05-19 ENCOUNTER — Encounter: Payer: Self-pay | Admitting: Internal Medicine

## 2013-05-19 ENCOUNTER — Ambulatory Visit: Payer: Medicaid Other | Attending: Internal Medicine | Admitting: Internal Medicine

## 2013-05-19 VITALS — BP 132/83 | HR 78 | Temp 97.8°F | Resp 16 | Ht 68.0 in | Wt 199.0 lb

## 2013-05-19 DIAGNOSIS — J449 Chronic obstructive pulmonary disease, unspecified: Secondary | ICD-10-CM | POA: Insufficient documentation

## 2013-05-19 DIAGNOSIS — E119 Type 2 diabetes mellitus without complications: Secondary | ICD-10-CM

## 2013-05-19 DIAGNOSIS — Z794 Long term (current) use of insulin: Secondary | ICD-10-CM | POA: Insufficient documentation

## 2013-05-19 DIAGNOSIS — Z760 Encounter for issue of repeat prescription: Secondary | ICD-10-CM | POA: Insufficient documentation

## 2013-05-19 DIAGNOSIS — J4489 Other specified chronic obstructive pulmonary disease: Secondary | ICD-10-CM | POA: Insufficient documentation

## 2013-05-19 DIAGNOSIS — I10 Essential (primary) hypertension: Secondary | ICD-10-CM | POA: Insufficient documentation

## 2013-05-19 LAB — GLUCOSE, POCT (MANUAL RESULT ENTRY): POC Glucose: 118 mg/dl — AB (ref 70–99)

## 2013-05-19 MED ORDER — HYDROCODONE-ACETAMINOPHEN 5-325 MG PO TABS: 1.0000 | ORAL_TABLET | Freq: Four times a day (QID) | ORAL | Status: AC | PRN

## 2013-05-19 NOTE — Patient Instructions (Signed)
Back Pain, Adult Low back pain is very common. About 1 in 5 people have back pain.The cause of low back pain is rarely dangerous. The pain often gets better over time.About half of people with a sudden onset of back pain feel better in just 2 weeks. About 8 in 10 people feel better by 6 weeks.  CAUSES Some common causes of back pain include:  Strain of the muscles or ligaments supporting the spine.  Wear and tear (degeneration) of the spinal discs.  Arthritis.  Direct injury to the back. DIAGNOSIS Most of the time, the direct cause of low back pain is not known.However, back pain can be treated effectively even when the exact cause of the pain is unknown.Answering your caregiver's questions about your overall health and symptoms is one of the most accurate ways to make sure the cause of your pain is not dangerous. If your caregiver needs more information, he or she may order lab work or imaging tests (X-rays or MRIs).However, even if imaging tests show changes in your back, this usually does not require surgery. HOME CARE INSTRUCTIONS For many people, back pain returns.Since low back pain is rarely dangerous, it is often a condition that people can learn to manageon their own.   Remain active. It is stressful on the back to sit or stand in one place. Do not sit, drive, or stand in one place for more than 30 minutes at a time. Take short walks on level surfaces as soon as pain allows.Try to increase the length of time you walk each day.  Do not stay in bed.Resting more than 1 or 2 days can delay your recovery.  Do not avoid exercise or work.Your body is made to move.It is not dangerous to be active, even though your back may hurt.Your back will likely heal faster if you return to being active before your pain is gone.  Pay attention to your body when you bend and lift. Many people have less discomfortwhen lifting if they bend their knees, keep the load close to their bodies,and  avoid twisting. Often, the most comfortable positions are those that put less stress on your recovering back.  Find a comfortable position to sleep. Use a firm mattress and lie on your side with your knees slightly bent. If you lie on your back, put a pillow under your knees.  Only take over-the-counter or prescription medicines as directed by your caregiver. Over-the-counter medicines to reduce pain and inflammation are often the most helpful.Your caregiver may prescribe muscle relaxant drugs.These medicines help dull your pain so you can more quickly return to your normal activities and healthy exercise.  Put ice on the injured area.  Put ice in a plastic bag.  Place a towel between your skin and the bag.  Leave the ice on for 15-20 minutes, 03-04 times a day for the first 2 to 3 days. After that, ice and heat may be alternated to reduce pain and spasms.  Ask your caregiver about trying back exercises and gentle massage. This may be of some benefit.  Avoid feeling anxious or stressed.Stress increases muscle tension and can worsen back pain.It is important to recognize when you are anxious or stressed and learn ways to manage it.Exercise is a great option. SEEK MEDICAL CARE IF:  You have pain that is not relieved with rest or medicine.  You have pain that does not improve in 1 week.  You have new symptoms.  You are generally not feeling well. SEEK   IMMEDIATE MEDICAL CARE IF:   You have pain that radiates from your back into your legs.  You develop new bowel or bladder control problems.  You have unusual weakness or numbness in your arms or legs.  You develop nausea or vomiting.  You develop abdominal pain.  You feel faint. Document Released: 04/03/2005 Document Revised: 10/03/2011 Document Reviewed: 08/22/2010 [employer] Patient Information 2014 [employer].  

## 2013-05-19 NOTE — Progress Notes (Signed)
Pt here to establish care for Hx. Diabetes, HTN,COPD and chronic pain Pt is taking insulin coverage CBG/A1c done C/o right hip pain s/p THR 2012 cane assist

## 2013-05-19 NOTE — Progress Notes (Signed)
Patient ID: Ethan Jordan, male   DOB: 1955-06-04, 58 y.o.   MRN: [id number]   CC: follow up  HPI: Pt is 58 yo male who presents to clinic for follow up and needs refill on medications. He denies chest pain or shortness of breath, no specific abdominal or urinary concerns.   No Known Allergies Past Medical History  Diagnosis Date  . Renal disorder   . Hepatitis C   . COPD (chronic obstructive pulmonary disease)     On inhaled steroids  . Diabetes mellitus    Current Outpatient Prescriptions on File Prior to Visit  Medication Sig Dispense Refill  . budesonide-formoterol (SYMBICORT) 160-4.5 MCG/ACT inhaler Inhale 2 puffs into the lungs 2 (two) times daily as needed. For shortness of breath      . chlorpheniramine-HYDROcodone (TUSSIONEX PENNKINETIC ER) 10-8 MG/5ML LQCR Take 5 mLs by mouth every 12 (twelve) hours as needed for cough.  115 mL  0  . cholecalciferol (VITAMIN D) 1000 UNITS tablet Take 1,000 Units by mouth 2 (two) times daily after a meal.      . cyanocobalamin 500 MCG tablet Take 1,000 mcg by mouth daily.      . insulin aspart (NOVOLOG) 100 UNIT/ML injection Inject 0-15 Units into the skin 3 (three) times daily before meals. SLIDING SCALE INSULIN    CBG < 70: Drink juice; CBG 70 - 120: 0 units: CBG 121 - 150: 2 units; CBG 151 - 200: 3 units; CBG 201 - 250: 5 units; CBG 251 - 300: 8 units;CBG 301 - 350: 11 units; CBG 351 - 400: 15 units; CBG > 400 : 15 units and NOTIFY MD  1 vial    . insulin glargine (LANTUS) 100 UNIT/ML injection Inject 45 Units into the skin at bedtime.  10 mL    . mometasone-formoterol (DULERA) 200-5 MCG/ACT AERO Inhale 2 puffs into the lungs 2 (two) times daily.      Marland Kitchen omeprazole (PRILOSEC) 20 MG capsule Take 20 mg by mouth daily.       . traMADol (ULTRAM) 50 MG tablet Take 50 mg by mouth every 8 (eight) hours as needed for moderate pain.      Marland Kitchen albuterol (PROVENTIL) (5 MG/ML) 0.5% nebulizer solution Take 2.5 mg by nebulization daily as needed. For  wheezing/sob      . fish oil-omega-3 fatty acids 1000 MG capsule Take 2 g by mouth 2 (two) times daily.       . flunisolide (NASALIDE) 25 MCG/ACT (0.025%) SOLN Place 2 sprays into both nostrils daily.       No current facility-administered medications on file prior to visit.   No known family medical history  History   Social History  . Marital Status: Single    Spouse Name: N/A    Number of Children: N/A  . Years of Education: N/A   Occupational History  . Not on file.   Social History Main Topics  . Smoking status: Current Every Day Smoker -- 0.50 packs/day    Types: Cigarettes  . Smokeless tobacco: Not on file     Comment: states he is using nicotine gum  . Alcohol Use: No  . Drug Use: Not on file  . Sexual Activity: Not on file   Other Topics Concern  . Not on file   Social History Narrative  . No narrative on file    Review of Systems  Constitutional: Negative for fever, chills, diaphoresis, activity change, appetite change and fatigue.  HENT: Negative for ear  pain, nosebleeds, congestion, facial swelling, rhinorrhea, neck pain, neck stiffness and ear discharge.   Eyes: Negative for pain, discharge, redness, itching and visual disturbance.  Respiratory: Negative for cough, choking, chest tightness, shortness of breath, wheezing and stridor.   Cardiovascular: Negative for chest pain, palpitations and leg swelling.  Gastrointestinal: Negative for abdominal distention.  Genitourinary: Negative for dysuria, urgency, frequency, hematuria, flank pain, decreased urine volume, difficulty urinating and dyspareunia.  Musculoskeletal: Negative for back pain, joint swelling, arthralgias and gait problem.  Neurological: Negative for dizziness, tremors, seizures, syncope, facial asymmetry, speech difficulty, weakness, light-headedness, numbness and headaches.  Hematological: Negative for adenopathy. Does not bruise/bleed easily.  Psychiatric/Behavioral: Negative for  hallucinations, behavioral problems, confusion, dysphoric mood, decreased concentration and agitation.    Objective:   Filed Vitals:   05/19/13 1203  BP: 132/83  Pulse: 78  Temp: 97.8 F (36.6 C)  Resp: 16    Physical Exam  Constitutional: Appears well-developed and well-nourished. No distress.  CVS: RRR, S1/S2 +, no murmurs, no gallops, no carotid bruit.  Pulmonary: Effort and breath sounds normal, no stridor, rhonchi, wheezes, rales.  Abdominal: Soft. BS +,  no distension, tenderness, rebound or guarding.  Skin: Skin is warm and dry. No rash noted. Not diaphoretic. No erythema. No pallor.  Psychiatric: Normal mood and affect. Behavior, judgment, thought content normal.   Lab Results  Component Value Date   WBC 5.3 03/22/2013   HGB 15.8 03/22/2013   HCT 45.3 03/22/2013   MCV 103.4* 03/22/2013   PLT 120* 03/22/2013   Lab Results  Component Value Date   CREATININE 0.70 03/22/2013   BUN 10 03/22/2013   NA 136 03/22/2013   K 4.1 03/22/2013   CL 98 03/22/2013   CO2 28 03/22/2013    Lab Results  Component Value Date   HGBA1C 9.5* 07/31/2011   Lipid Panel     Component Value Date/Time   CHOL 107 07/20/2011 1630   TRIG 166* 07/20/2011 1630   HDL 35* 07/20/2011 1630   CHOLHDL 3.1 07/20/2011 1630   VLDL 33 07/20/2011 1630   LDLCALC 39 07/20/2011 1630       Assessment and plan:   Patient Active Problem List   Diagnosis Date Noted  . COPD - stable and maintaining oxygen saturations at target range  07/19/2011  . HTN - reasonable control  07/19/2011  . DM - will check A1C today and will readjust the regimen as indicated  07/19/2011

## 2013-05-23 ENCOUNTER — Telehealth: Payer: Self-pay | Admitting: Internal Medicine

## 2013-05-23 NOTE — Telephone Encounter (Signed)
Patient was prescribed hydrocodine by Dr. Izola PriceMyers. He said that the script was wrong he was given 5-325, and he would like 10-325. His medication [facility] to [facility] in [place].

## 2013-05-24 ENCOUNTER — Emergency Department (HOSPITAL_COMMUNITY)
Admission: EM | Admit: 2013-05-24 | Discharge: 2013-05-25 | Disposition: A | Discharge status: Home / Self Care | Payer: Medicaid Other | Attending: Emergency Medicine | Admitting: Emergency Medicine

## 2013-05-24 ENCOUNTER — Encounter (HOSPITAL_COMMUNITY): Payer: Self-pay | Admitting: Emergency Medicine

## 2013-05-24 ENCOUNTER — Emergency Department (HOSPITAL_COMMUNITY): Payer: Medicaid Other

## 2013-05-24 DIAGNOSIS — B9789 Other viral agents as the cause of diseases classified elsewhere: Secondary | ICD-10-CM

## 2013-05-24 DIAGNOSIS — IMO0002 Reserved for concepts with insufficient information to code with codable children: Secondary | ICD-10-CM | POA: Insufficient documentation

## 2013-05-24 DIAGNOSIS — Z87448 Personal history of other diseases of urinary system: Secondary | ICD-10-CM | POA: Insufficient documentation

## 2013-05-24 DIAGNOSIS — F17200 Nicotine dependence, unspecified, uncomplicated: Secondary | ICD-10-CM | POA: Insufficient documentation

## 2013-05-24 DIAGNOSIS — Z794 Long term (current) use of insulin: Secondary | ICD-10-CM | POA: Insufficient documentation

## 2013-05-24 DIAGNOSIS — J069 Acute upper respiratory infection, unspecified: Secondary | ICD-10-CM | POA: Insufficient documentation

## 2013-05-24 DIAGNOSIS — Z79899 Other long term (current) drug therapy: Secondary | ICD-10-CM | POA: Insufficient documentation

## 2013-05-24 DIAGNOSIS — J441 Chronic obstructive pulmonary disease with (acute) exacerbation: Secondary | ICD-10-CM | POA: Insufficient documentation

## 2013-05-24 DIAGNOSIS — R0789 Other chest pain: Secondary | ICD-10-CM | POA: Insufficient documentation

## 2013-05-24 DIAGNOSIS — E119 Type 2 diabetes mellitus without complications: Secondary | ICD-10-CM | POA: Insufficient documentation

## 2013-05-24 DIAGNOSIS — Z8619 Personal history of other infectious and parasitic diseases: Secondary | ICD-10-CM | POA: Insufficient documentation

## 2013-05-24 DIAGNOSIS — J988 Other specified respiratory disorders: Secondary | ICD-10-CM

## 2013-05-24 NOTE — ED Notes (Signed)
Pt arrived to the ED with a complaint of congestion.  Pt was treated a month ago for pneumonia and states he has the same symptoms as that episode.  Pt states he has chest congestion, post nasal drip, headache and body aches and generalized weakness.

## 2013-05-24 NOTE — ED Notes (Signed)
Patient transported to X-ray 

## 2013-05-25 LAB — COMPREHENSIVE METABOLIC PANEL
ALT: 42 U/L (ref 0–53)
AST: 78 U/L — AB (ref 0–37)
Albumin: 3.4 g/dL — ABNORMAL LOW (ref 3.5–5.2)
Alkaline Phosphatase: 91 U/L (ref 39–117)
BUN: 7 mg/dL (ref 6–23)
CALCIUM: 9.5 mg/dL (ref 8.4–10.5)
CO2: 23 meq/L (ref 19–32)
Chloride: 100 mEq/L (ref 96–112)
Creatinine, Ser: 0.59 mg/dL (ref 0.50–1.35)
GFR calc Af Amer: >90 mL/min (ref >90)
GFR calc non Af Amer: >90 mL/min (ref >90)
Glucose, Bld: 106 mg/dL — ABNORMAL HIGH (ref 70–99)
POTASSIUM: 4.8 meq/L (ref 3.7–5.3)
SODIUM: 138 meq/L (ref 137–147)
Total Bilirubin: 0.8 mg/dL (ref 0.3–1.2)
Total Protein: 8.5 g/dL — ABNORMAL HIGH (ref 6.0–8.3)

## 2013-05-25 LAB — CBC WITH DIFFERENTIAL/PLATELET
Basophils Absolute: 0 10*3/uL (ref 0.0–0.1)
Basophils Relative: 1 % (ref 0–1)
EOS PCT: 2 % (ref 0–5)
Eosinophils Absolute: 0.2 10*3/uL (ref 0.0–0.7)
HCT: 44.6 % (ref 39.0–52.0)
Hemoglobin: 15.4 g/dL (ref 13.0–17.0)
LYMPHS PCT: 48 % — AB (ref 12–46)
Lymphs Abs: 3.8 10*3/uL (ref 0.7–4.0)
MCH: 35.2 pg — ABNORMAL HIGH (ref 26.0–34.0)
MCHC: 34.5 g/dL (ref 30.0–36.0)
MCV: 102.1 fL — AB (ref 78.0–100.0)
Monocytes Absolute: 1.3 10*3/uL — ABNORMAL HIGH (ref 0.1–1.0)
Monocytes Relative: 17 % — ABNORMAL HIGH (ref 3–12)
NEUTROS PCT: 33 % — AB (ref 43–77)
Neutro Abs: 2.6 10*3/uL (ref 1.7–7.7)
PLATELETS: 166 10*3/uL (ref 150–400)
RBC: 4.37 MIL/uL (ref 4.22–5.81)
RDW: 12.8 % (ref 11.5–15.5)
WBC: 8 10*3/uL (ref 4.0–10.5)

## 2013-05-25 LAB — TROPONIN I: Troponin I: <0.3 ng/mL (ref <0.30)

## 2013-05-25 MED ORDER — PREDNISONE 20 MG PO TABS
60.0000 mg | ORAL_TABLET | Freq: Once | ORAL | Status: AC
  Administered 2013-05-25: 60 mg via ORAL
  Filled 2013-05-25: qty 3

## 2013-05-25 MED ORDER — OXYMETAZOLINE HCL 0.05 % NA SOLN
1.0000 | Freq: Once | NASAL | Status: AC
Start: 2013-05-25 — End: 2013-05-25
  Administered 2013-05-25: 1 via NASAL
  Filled 2013-05-25: qty 15

## 2013-05-25 MED ORDER — ALBUTEROL SULFATE (2.5 MG/3ML) 0.083% IN NEBU
5.0000 mg | INHALATION_SOLUTION | Freq: Once | RESPIRATORY_TRACT | Status: AC
  Administered 2013-05-25: 5 mg via RESPIRATORY_TRACT
  Filled 2013-05-25: qty 6

## 2013-05-25 MED ORDER — BENZONATATE 100 MG PO CAPS: 100.0000 mg | ORAL_CAPSULE | Freq: Three times a day (TID) | ORAL | Status: AC

## 2013-05-25 MED ORDER — IPRATROPIUM BROMIDE 0.02 % IN SOLN
0.5000 mg | Freq: Once | RESPIRATORY_TRACT | Status: AC
  Administered 2013-05-25: 0.5 mg via RESPIRATORY_TRACT
  Filled 2013-05-25: qty 2.5

## 2013-05-25 MED ORDER — ALBUTEROL SULFATE HFA 108 (90 BASE) MCG/ACT IN AERS
2.0000 | INHALATION_SPRAY | Freq: Once | RESPIRATORY_TRACT | Status: AC
  Administered 2013-05-25: 2 via RESPIRATORY_TRACT
  Filled 2013-05-25: qty 6.7

## 2013-05-25 NOTE — ED Notes (Signed)
Pt. Able to walk in hallway on room air with O2 sat of 100%. Denies SOB .

## 2013-05-25 NOTE — Discharge Instructions (Signed)
Your x-ray today shows no evidence of pneumonia. Recommend you use an albuterol inhaler, 2 puffs every 4 hours, as needed for shortness of breath. Use Tessalon as prescribed for cough. Drink plenty of fluids and get plenty of rest. Followup with your primary care doctor on Monday. Return if symptoms worsen.  Bronchitis Bronchitis is inflammation of the airways that extend from the windpipe into the lungs (bronchi). The inflammation often causes mucus to develop, which leads to a cough. If the inflammation becomes severe, it may cause shortness of breath. CAUSES  Bronchitis may be caused by:   Viral infections.   Bacteria.   Cigarette smoke.   Allergens, pollutants, and other irritants.  SIGNS AND SYMPTOMS  The most common symptom of bronchitis is a frequent cough that produces mucus. Other symptoms include:  Fever.   Body aches.   Chest congestion.   Chills.   Shortness of breath.   Sore throat.  DIAGNOSIS  Bronchitis is usually diagnosed through a medical history and physical exam. Tests, such as chest X-rays, are sometimes done to rule out other conditions.  TREATMENT  You may need to avoid contact with whatever caused the problem (smoking, for example). Medicines are sometimes needed. These may include:  Antibiotics. These may be prescribed if the condition is caused by bacteria.  Cough suppressants. These may be prescribed for relief of cough symptoms.   Inhaled medicines. These may be prescribed to help open your airways and make it easier for you to breathe.   Steroid medicines. These may be prescribed for those with recurrent (chronic) bronchitis. HOME CARE INSTRUCTIONS  Get plenty of rest.   Drink enough fluids to keep your urine clear or pale yellow (unless you have a medical condition that requires fluid restriction). Increasing fluids may help thin your secretions and will prevent dehydration.   Only take over-the-counter or prescription  medicines as directed by your health care provider.  Only take antibiotics as directed. Make sure you finish them even if you start to feel better.  Avoid secondhand smoke, irritating chemicals, and strong fumes. These will make bronchitis worse. If you are a smoker, quit smoking. Consider using nicotine gum or skin patches to help control withdrawal symptoms. Quitting smoking will help your lungs heal faster.   Put a cool-mist humidifier in your bedroom at night to moisten the air. This may help loosen mucus. Change the water in the humidifier daily. You can also run the hot water in your shower and sit in the bathroom with the door closed for 5 10 minutes.   Follow up with your health care provider as directed.   Wash your hands frequently to avoid catching bronchitis again or spreading an infection to others.  SEEK MEDICAL CARE IF: Your symptoms do not improve after 1 week of treatment.  SEEK IMMEDIATE MEDICAL CARE IF:  Your fever increases.  You have chills.   You have chest pain.   You have worsening shortness of breath.   You have bloody sputum.  You faint.  You have lightheadedness.  You have a severe headache.   You vomit repeatedly. MAKE SURE YOU:   Understand these instructions.  Will watch your condition.  Will get help right away if you are not doing well or get worse. Document Released: 04/03/2005 Document Revised: 01/22/2013 Document Reviewed: 11/26/2012 [facility] WayExitCare Patient Information 2014 [place] ValleyExitCare, [place].

## 2013-05-25 NOTE — ED Provider Notes (Signed)
CSN: [id number]     Arrival date & time 05/24/13  2106 History   First MD Initiated Contact with Patient 05/25/13 0057     Chief Complaint  Patient presents with  . Nasal Congestion   (Consider location/radiation/quality/duration/timing/severity/associated sxs/prior Treatment) HPI Comments: Patient is a [age] her old male with history of COPD, DM, and hepatitis C who presents for nasal congestion with associated cough. Patient states the cough has been productive of yellow phlegm. He also endorses associated chest tightness. Patient states symptoms have been ongoing for 4 days. He has tried an albuterol inhaler without relief of symptoms. He states that chest tightness is aggravated with coughing or deep breathing. Patient denies associated fever, nausea or vomiting, chest pain, syncope, and abdominal pain.  The history is provided by the patient. No language interpreter was used.    Past Medical History  Diagnosis Date  . Renal disorder   . Hepatitis C   . COPD (chronic obstructive pulmonary disease)     On inhaled steroids  . Diabetes mellitus    Past Surgical History  Procedure Laterality Date  . Total hip arthroplasty      right  . Cholecystectomy  08/01/2011    Procedure: LAPAROSCOPIC CHOLECYSTECTOMY WITH INTRAOPERATIVE CHOLANGIOGRAM;  Surgeon: Robyne AskewPaul S Toth III, MD;  [facility];  Service: General;  Laterality: N/A;   History reviewed. No pertinent family history. History  Substance Use Topics  . Smoking status: Current Every Day Smoker -- 0.50 packs/day    Types: Cigarettes  . Smokeless tobacco: Not on file     Comment: states he is using nicotine gum  . Alcohol Use: No    Review of Systems  Constitutional: Positive for chills. Negative for fever.  HENT: Positive for congestion. Negative for rhinorrhea.   Respiratory: Positive for cough, chest tightness and shortness of breath.   Cardiovascular: Negative for chest pain.  Gastrointestinal: Negative for nausea, vomiting  and abdominal pain.  Neurological: Negative for syncope.  All other systems reviewed and are negative.    Allergies  Review of patient's allergies indicates no known allergies.  Home Medications   Current Outpatient Rx  Name  Route  Sig  Dispense  Refill  . budesonide-formoterol (SYMBICORT) 160-4.5 MCG/ACT inhaler   Inhalation   Inhale 2 puffs into the lungs 2 (two) times daily as needed. For shortness of breath         . cholecalciferol (VITAMIN D) 1000 UNITS tablet   Oral   Take 1,000 Units by mouth 2 (two) times daily after a meal.         . cyanocobalamin 500 MCG tablet   Oral   Take 1,000 mcg by mouth daily.         . fish oil-omega-3 fatty acids 1000 MG capsule   Oral   Take 2 g by mouth 2 (two) times daily.          . flunisolide (NASALIDE) 25 MCG/ACT (0.025%) SOLN   Each Nare   Place 2 sprays into both nostrils daily.         Marland Kitchen. HYDROcodone-acetaminophen (NORCO/VICODIN) 5-325 MG per tablet   Oral   Take 1 tablet by mouth every 6 (six) hours as needed for moderate pain.   65 tablet   0   . insulin aspart (NOVOLOG) 100 UNIT/ML injection   Subcutaneous   Inject 0-15 Units into the skin 3 (three) times daily before meals. SLIDING SCALE INSULIN    CBG < 70: Drink juice; CBG  70 - 120: 0 units: CBG 121 - 150: 2 units; CBG 151 - 200: 3 units; CBG 201 - 250: 5 units; CBG 251 - 300: 8 units;CBG 301 - 350: 11 units; CBG 351 - 400: 15 units; CBG > 400 : 15 units and NOTIFY MD   1 vial      . insulin glargine (LANTUS) 100 UNIT/ML injection   Subcutaneous   Inject 45 Units into the skin at bedtime.   10 mL      . mometasone-formoterol (DULERA) 200-5 MCG/ACT AERO   Inhalation   Inhale 2 puffs into the lungs 2 (two) times daily.         . traMADol (ULTRAM) 50 MG tablet   Oral   Take 50 mg by mouth every 8 (eight) hours as needed for moderate pain.         Marland Kitchen EXPIRED: albuterol (PROVENTIL) (5 MG/ML) 0.5% nebulizer solution   Nebulization   Take 2.5 mg  by nebulization daily as needed. For wheezing/sob         . benzonatate (TESSALON) 100 MG capsule   Oral   Take 1 capsule (100 mg total) by mouth every 8 (eight) hours.   21 capsule   0    BP 159/75  Pulse 98  Temp(Src) 98.6 F (37 C) (Oral)  Resp 19  SpO2 97%  Physical Exam  Nursing note and vitals reviewed. Constitutional: He is oriented to person, place, and time. He appears well-developed and well-nourished. No distress.  HENT:  Head: Normocephalic and atraumatic.  Mouth/Throat: Oropharynx is clear and moist. No oropharyngeal exudate.  Eyes: Conjunctivae and EOM are normal. No scleral icterus.  Neck: Normal range of motion.  Cardiovascular: Normal rate, regular rhythm and normal heart sounds.   Pulmonary/Chest: Effort normal. No respiratory distress. He has wheezes. He has no rales. He exhibits no tenderness.  Diffuse mild expiratory wheezes. No retractions or accessory muscle use. No tachypnea noted. Symmetric chest expansion.  Abdominal: Soft. He exhibits no distension. There is no tenderness.  Musculoskeletal: Normal range of motion.  Neurological: He is alert and oriented to person, place, and time.  Skin: Skin is warm and dry. No rash noted. He is not diaphoretic. No erythema. No pallor.  Psychiatric: He has a normal mood and affect. His behavior is normal.    ED Course  Procedures (including critical care time) Labs Review Labs Reviewed  CBC WITH DIFFERENTIAL - Abnormal; Notable for the following:    MCV 102.1 (*)    MCH 35.2 (*)    Neutrophils Relative % 33 (*)    Lymphocytes Relative 48 (*)    Monocytes Relative 17 (*)    Monocytes Absolute 1.3 (*)    All other components within normal limits  COMPREHENSIVE METABOLIC PANEL - Abnormal; Notable for the following:    Glucose, Bld 106 (*)    Total Protein 8.5 (*)    Albumin 3.4 (*)    AST 78 (*)    All other components within normal limits  TROPONIN I   Imaging Review Dg Chest 2 View  05/24/2013    CLINICAL DATA:  58 year old with shortness of breath, chest pain, cough and congestion.  EXAM: CHEST  2 VIEW  COMPARISON:  03/22/2013 and prior chest radiographs  FINDINGS: The cardiomediastinal silhouette is unremarkable.  Mild peribronchial thickening is stable.  There is no evidence of focal airspace disease, pulmonary edema, suspicious pulmonary nodule/mass, pleural effusion, or pneumothorax. No acute bony abnormalities are identified.  IMPRESSION: No  active cardiopulmonary disease.   Electronically Signed   By: Laveda Abbe M.D.   On: 05/24/2013 22:51    EKG Interpretation   None       MDM   1. Viral respiratory illness    Uncomplicated viral respiratory illness. Patient with chief complaint of nasal congestion associated with chest tightness, cough, and mild shortness of breath. He is well and nontoxic appearing today as well as hemodynamically stable and afebrile. Patient has diffuse mild expiratory wheezes on physical exam. No retractions or accessory muscle use noted. Patient has symmetric chest expansion. No rales or rhonchi appreciated. X-ray today negative for focal consolidation or pneumonia. He is also without tachypnea, dyspnea, or hypoxia. No leukocytosis today. Will manage symptoms with DuoNeb and oral steroids as well as Afrin for nasal congestion and reassess.  Patient endorses improvement in symptoms with oral steroids and nebulizer treatment today. He ambulates without hypoxia, dyspnea, or tachypnea. Do not believe further workup is indicated today. Patient stable for discharge with albuterol inhaler and prescription for Tessalon for cough. Advised primary care followup for recheck. Return precautions discussed and patient agreeable to plan with no unaddressed concerns.   Filed Vitals:   05/24/13 2134 05/24/13 2320 05/25/13 0143 05/25/13 0254  BP: 159/75   136/83  Pulse: 84 98  74  Temp: 98.8 F (37.1 C) 98.6 F (37 C)    TempSrc: Oral Oral    Resp: 14 19  18   SpO2: 98%  99% 97% 97%     Antony Madura, PA-C 05/30/13 0149

## 2013-05-26 NOTE — Telephone Encounter (Signed)
Patient would like his oxycodine changed to 10-325 He was prescribed 5-325mg 

## 2013-05-30 NOTE — ED Provider Notes (Signed)
Medical screening examination/treatment/procedure(s) were performed by non-physician practitioner and as supervising physician I was immediately available for consultation/collaboration.   Sunnie NielsenBrian Melissa Pulido, MD 05/30/13 [phone]

## 2013-06-11 ENCOUNTER — Telehealth: Payer: Self-pay

## 2013-06-11 ENCOUNTER — Telehealth: Payer: Self-pay | Admitting: Internal Medicine

## 2013-06-11 NOTE — Telephone Encounter (Signed)
Pt is calling in today to see about a problem with his medication HYDROcodone-acetaminophen (NORCO/VICODIN) 5-325 MG per tablet; pt states that the medication dosage is an error; please f/u with pt about this matter

## 2013-06-11 NOTE — Telephone Encounter (Signed)
Patient received a script for pain States not the right dosage Want us to right new prescription i explained our policy to him And referred him to pain management

## 2013-06-30 ENCOUNTER — Telehealth: Payer: Self-pay | Admitting: Internal Medicine

## 2013-06-30 NOTE — Telephone Encounter (Signed)
Pt called and would like for a nurse to give him a call back, pt did not explain why. Please contact pt

## 2013-07-03 NOTE — Telephone Encounter (Signed)
Called about needing a form filled out  Has an appointment on the 07/16/13 i told him he could fax it or drop it at the office

## 2013-07-09 ENCOUNTER — Ambulatory Visit: Payer: Medicaid Other

## 2013-07-21 ENCOUNTER — Ambulatory Visit: Payer: Medicaid Other | Attending: Anesthesiology | Admitting: Physical Therapy

## 2013-07-29 ENCOUNTER — Encounter: Payer: Medicaid Other | Admitting: Physical Therapy

## 2013-08-04 ENCOUNTER — Ambulatory Visit: Payer: Medicaid Other

## 2014-02-18 ENCOUNTER — Encounter: Payer: Self-pay | Admitting: Gastroenterology

## 2014-02-18 ENCOUNTER — Ambulatory Visit: Payer: Medicaid Other | Admitting: Gastroenterology

## 2014-09-17 ENCOUNTER — Ambulatory Visit (INDEPENDENT_AMBULATORY_CARE_PROVIDER_SITE_OTHER): Payer: Medicaid Other | Admitting: Family Medicine

## 2014-09-17 VITALS — BP 135/76 | HR 94 | Temp 98.6°F | Resp 16 | Ht 68.0 in | Wt 189.0 lb

## 2014-09-17 DIAGNOSIS — Z8619 Personal history of other infectious and parasitic diseases: Secondary | ICD-10-CM

## 2014-09-17 DIAGNOSIS — J449 Chronic obstructive pulmonary disease, unspecified: Secondary | ICD-10-CM | POA: Insufficient documentation

## 2014-09-17 DIAGNOSIS — I10 Essential (primary) hypertension: Secondary | ICD-10-CM | POA: Diagnosis not present

## 2014-09-17 DIAGNOSIS — G8929 Other chronic pain: Secondary | ICD-10-CM

## 2014-09-17 DIAGNOSIS — M25551 Pain in right hip: Secondary | ICD-10-CM

## 2014-09-17 DIAGNOSIS — R8290 Unspecified abnormal findings in urine: Secondary | ICD-10-CM

## 2014-09-17 DIAGNOSIS — E119 Type 2 diabetes mellitus without complications: Secondary | ICD-10-CM | POA: Diagnosis not present

## 2014-09-17 LAB — COMPLETE METABOLIC PANEL WITH GFR
ALK PHOS: 75 U/L (ref 39–117)
ALT: 19 U/L (ref 0–53)
AST: 33 U/L (ref 0–37)
Albumin: 4 g/dL (ref 3.5–5.2)
BUN: 9 mg/dL (ref 6–23)
CALCIUM: 9.9 mg/dL (ref 8.4–10.5)
CO2: 26 meq/L (ref 19–32)
Chloride: 107 mEq/L (ref 96–112)
Creat: 0.78 mg/dL (ref 0.50–1.35)
GFR, Est Non African American: >89 mL/min
GLUCOSE: 110 mg/dL — AB (ref 70–99)
POTASSIUM: 4.7 meq/L (ref 3.5–5.3)
Sodium: 144 mEq/L (ref 135–145)
Total Bilirubin: 0.6 mg/dL (ref 0.2–1.2)
Total Protein: 8.1 g/dL (ref 6.0–8.3)

## 2014-09-17 LAB — POCT URINALYSIS DIP (DEVICE)
Glucose, UA: 100 mg/dL — AB
Hgb urine dipstick: NEGATIVE
LEUKOCYTES UA: NEGATIVE
Nitrite: POSITIVE — AB
PH: 5.5 (ref 5.0–8.0)
PROTEIN: 100 mg/dL — AB
UROBILINOGEN UA: 2 mg/dL — AB (ref 0.0–1.0)

## 2014-09-17 LAB — LIPID PANEL
CHOLESTEROL: 189 mg/dL (ref 0–200)
HDL: 85 mg/dL (ref >=40)
LDL Cholesterol: 86 mg/dL (ref 0–99)
TRIGLYCERIDES: 91 mg/dL (ref <150)
Total CHOL/HDL Ratio: 2.2 Ratio
VLDL: 18 mg/dL (ref 0–40)

## 2014-09-17 LAB — HEMOGLOBIN A1C
Hgb A1c MFr Bld: 5.6 % (ref <5.7)
MEAN PLASMA GLUCOSE: 114 mg/dL (ref <117)

## 2014-09-17 LAB — GLUCOSE, CAPILLARY: GLUCOSE-CAPILLARY: 110 mg/dL — AB (ref 65–99)

## 2014-09-17 MED ORDER — LISINOPRIL 5 MG PO TABS: 5.0000 mg | ORAL_TABLET | Freq: Every day | ORAL | Status: AC

## 2014-09-17 MED ORDER — ALBUTEROL SULFATE (5 MG/ML) 0.5% IN NEBU: 2.5000 mg | INHALATION_SOLUTION | Freq: Every day | RESPIRATORY_TRACT | Status: AC | PRN

## 2014-09-17 NOTE — Patient Instructions (Signed)
Chronic Obstructive Pulmonary Disease Chronic obstructive pulmonary disease (COPD) is a common lung problem. In COPD, the flow of air from the lungs is limited. The way your lungs work will probably never return to normal, but there are things you can do to improve your lungs and make yourself feel better. HOME CARE  Take all medicines as told by your doctor.  Avoid medicines or cough syrups that dry up your airway (such as antihistamines) and do not allow you to get rid of thick spit. You do not need to avoid them if told differently by your doctor.  If you smoke, stop. Smoking makes the problem worse.  Avoid being around things that make your breathing worse (like smoke, chemicals, and fumes).  Use oxygen therapy and therapy to help improve your lungs (pulmonary rehabilitation) if told by your doctor. If you need home oxygen therapy, ask your doctor if you should buy a tool to measure your oxygen level (oximeter).  Avoid people who have a sickness you can catch (contagious).  Avoid going outside when it is very hot, cold, or humid.  Eat healthy foods. Eat smaller meals more often. Rest before meals.  Stay active, but remember to also rest.  Make sure to get all the shots (vaccines) your doctor recommends. Ask your doctor if you need a pneumonia shot.  Learn and use tips on how to relax.  Learn and use tips on how to control your breathing as told by your doctor. Try:  Breathing in (inhaling) through your nose for 1 second. Then, pucker your lips and breath out (exhale) through your lips for 2 seconds.  Putting one hand on your belly (abdomen). Breathe in slowly through your nose for 1 second. Your hand on your belly should move out. Pucker your lips and breathe out slowly through your lips. Your hand on your belly should move in as you breathe out.  Learn and use controlled coughing to clear thick spit from your lungs. The steps are:  Lean your head a little forward.  Breathe in  deeply.  Try to hold your breath for 3 seconds.  Keep your mouth slightly open while coughing 2 times.  Spit any thick spit out into a tissue.  Rest and do the steps again 1 or 2 times as needed. GET HELP IF:  You cough up more thick spit than usual.  There is a change in the color or thickness of the spit.  It is harder to breathe than usual.  Your breathing is faster than usual. GET HELP RIGHT AWAY IF:   You have shortness of breath while resting.  You have shortness of breath that stops you from:  Being able to talk.  Doing normal activities.  You chest hurts for longer than 5 minutes.  Your skin color is more blue than usual.  Your pulse oximeter shows that you have low oxygen for longer than 5 minutes. MAKE SURE YOU:   Understand these instructions.  Will watch your condition.  Will get help right away if you are not doing well or get worse. Document Released: 09/20/2007 Document Revised: 08/18/2013 Document Reviewed: 11/28/2012 [facility] Patient Information 2015 [place], [place]. This information is not intended to replace advice given to you by your health care provider. Make sure you discuss any questions you have with your health care provider. Diabetes and Exercise Exercising regularly is important. It is not just about losing weight. It has many health benefits, such as:  Improving your overall fitness, flexibility,  and endurance.  Increasing your bone density.  Helping with weight control.  Decreasing your body fat.  Increasing your muscle strength.  Reducing stress and tension.  Improving your overall health. People with diabetes who exercise gain additional benefits because exercise:  Reduces appetite.  Improves the body's use of blood sugar (glucose).  Helps lower or control blood glucose.  Decreases blood pressure.  Helps control blood lipids (such as cholesterol and triglycerides).  Improves the body's use of the hormone insulin  by:  Increasing the body's insulin sensitivity.  Reducing the body's insulin needs.  Decreases the risk for heart disease because exercising:  Lowers cholesterol and triglycerides levels.  Increases the levels of good cholesterol (such as high-density lipoproteins [HDL]) in the body.  Lowers blood glucose levels. YOUR ACTIVITY PLAN  Choose an activity that you enjoy and set realistic goals. Your health care provider or diabetes educator can help you make an activity plan that works for you. Exercise regularly as directed by your health care provider. This includes:  Performing resistance training twice a week such as push-ups, sit-ups, lifting weights, or using resistance bands.  Performing 150 minutes of cardio exercises each week such as walking, running, or playing sports.  Staying active and spending no more than 90 minutes at one time being inactive. Even short bursts of exercise are good for you. Three 10-minute sessions spread throughout the day are just as beneficial as a single 30-minute session. Some exercise ideas include:  Taking the dog for a walk.  Taking the stairs instead of the elevator.  Dancing to your favorite song.  Doing an exercise video.  Doing your favorite exercise with a friend. RECOMMENDATIONS FOR EXERCISING WITH TYPE 1 OR TYPE 2 DIABETES   Check your blood glucose before exercising. If blood glucose levels are greater than 240 mg/dL, check for urine ketones. Do not exercise if ketones are present.  Avoid injecting insulin into areas of the body that are going to be exercised. For example, avoid injecting insulin into:  The arms when playing tennis.  The legs when jogging.  Keep a record of:  Food intake before and after you exercise.  Expected peak times of insulin action.  Blood glucose levels before and after you exercise.  The type and amount of exercise you have done.  Review your records with your health care provider. Your health  care provider will help you to develop guidelines for adjusting food intake and insulin amounts before and after exercising.  If you take insulin or oral hypoglycemic agents, watch for signs and symptoms of hypoglycemia. They include:  Dizziness.  Shaking.  Sweating.  Chills.  Confusion.  Drink plenty of water while you exercise to prevent dehydration or heat stroke. Body water is lost during exercise and must be replaced.  Talk to your health care provider before starting an exercise program to make sure it is safe for you. Remember, almost any type of activity is better than none. Document Released: 06/24/2003 Document Revised: 08/18/2013 Document Reviewed: 09/10/2012 [facility] Patient Information 2015 [place], [place]. This information is not intended to replace advice given to you by your health care provider. Make sure you discuss any questions you have with your health care provider. Diabetes and Exercise Exercising regularly is important. It is not just about losing weight. It has many health benefits, such as:  Improving your overall fitness, flexibility, and endurance.  Increasing your bone density.  Helping with weight control.  Decreasing your body fat.  Increasing your  muscle strength.  Reducing stress and tension.  Improving your overall health. People with diabetes who exercise gain additional benefits because exercise:  Reduces appetite.  Improves the body's use of blood sugar (glucose).  Helps lower or control blood glucose.  Decreases blood pressure.  Helps control blood lipids (such as cholesterol and triglycerides).  Improves the body's use of the hormone insulin by:  Increasing the body's insulin sensitivity.  Reducing the body's insulin needs.  Decreases the risk for heart disease because exercising:  Lowers cholesterol and triglycerides levels.  Increases the levels of good cholesterol (such as high-density lipoproteins [HDL]) in the  body.  Lowers blood glucose levels. YOUR ACTIVITY PLAN  Choose an activity that you enjoy and set realistic goals. Your health care provider or diabetes educator can help you make an activity plan that works for you. Exercise regularly as directed by your health care provider. This includes:  Performing resistance training twice a week such as push-ups, sit-ups, lifting weights, or using resistance bands.  Performing 150 minutes of cardio exercises each week such as walking, running, or playing sports.  Staying active and spending no more than 90 minutes at one time being inactive. Even short bursts of exercise are good for you. Three 10-minute sessions spread throughout the day are just as beneficial as a single 30-minute session. Some exercise ideas include:  Taking the dog for a walk.  Taking the stairs instead of the elevator.  Dancing to your favorite song.  Doing an exercise video.  Doing your favorite exercise with a friend. RECOMMENDATIONS FOR EXERCISING WITH TYPE 1 OR TYPE 2 DIABETES   Check your blood glucose before exercising. If blood glucose levels are greater than 240 mg/dL, check for urine ketones. Do not exercise if ketones are present.  Avoid injecting insulin into areas of the body that are going to be exercised. For example, avoid injecting insulin into:  The arms when playing tennis.  The legs when jogging.  Keep a record of:  Food intake before and after you exercise.  Expected peak times of insulin action.  Blood glucose levels before and after you exercise.  The type and amount of exercise you have done.  Review your records with your health care provider. Your health care provider will help you to develop guidelines for adjusting food intake and insulin amounts before and after exercising.  If you take insulin or oral hypoglycemic agents, watch for signs and symptoms of hypoglycemia. They  include:  Dizziness.  Shaking.  Sweating.  Chills.  Confusion.  Drink plenty of water while you exercise to prevent dehydration or heat stroke. Body water is lost during exercise and must be replaced.  Talk to your health care provider before starting an exercise program to make sure it is safe for you. Remember, almost any type of activity is better than none. Document Released: 06/24/2003 Document Revised: 08/18/2013 Document Reviewed: 09/10/2012 [facility] Patient Information 2015 [place], [place]. This information is not intended to replace advice given to you by your health care provider. Make sure you discuss any questions you have with your health care provider. Diabetes and Foot Care Diabetes may cause you to have problems because of poor blood supply (circulation) to your feet and legs. This may cause the skin on your feet to become thinner, break easier, and heal more slowly. Your skin may become dry, and the skin may peel and crack. You may also have nerve damage in your legs and feet causing decreased feeling in them.  You may not notice minor injuries to your feet that could lead to infections or more serious problems. Taking care of your feet is one of the most important things you can do for yourself.  HOME CARE INSTRUCTIONS  Wear shoes at all times, even in the house. Do not go barefoot. Bare feet are easily injured.  Check your feet daily for blisters, cuts, and redness. If you cannot see the bottom of your feet, use a mirror or ask someone for help.  Wash your feet with warm water (do not use hot water) and mild soap. Then pat your feet and the areas between your toes until they are completely dry. Do not soak your feet as this can dry your skin.  Apply a moisturizing lotion or petroleum jelly (that does not contain alcohol and is unscented) to the skin on your feet and to dry, brittle toenails. Do not apply lotion between your toes.  Trim your toenails straight across. Do  not dig under them or around the cuticle. File the edges of your nails with an emery board or nail file.  Do not cut corns or calluses or try to remove them with medicine.  Wear clean socks or stockings every day. Make sure they are not too tight. Do not wear knee-high stockings since they may decrease blood flow to your legs.  Wear shoes that fit properly and have enough cushioning. To break in new shoes, wear them for just a few hours a day. This prevents you from injuring your feet. Always look in your shoes before you put them on to be sure there are no objects inside.  Do not cross your legs. This may decrease the blood flow to your feet.  If you find a minor scrape, cut, or break in the skin on your feet, keep it and the skin around it clean and dry. These areas may be cleansed with mild soap and water. Do not cleanse the area with peroxide, alcohol, or iodine.  When you remove an adhesive bandage, be sure not to damage the skin around it.  If you have a wound, look at it several times a day to make sure it is healing.  Do not use heating pads or hot water bottles. They may burn your skin. If you have lost feeling in your feet or legs, you may not know it is happening until it is too late.  Make sure your health care provider performs a complete foot exam at least annually or more often if you have foot problems. Report any cuts, sores, or bruises to your health care provider immediately. SEEK MEDICAL CARE IF:   You have an injury that is not healing.  You have cuts or breaks in the skin.  You have an ingrown nail.  You notice redness on your legs or feet.  You feel burning or tingling in your legs or feet.  You have pain or cramps in your legs and feet.  Your legs or feet are numb.  Your feet always feel cold. SEEK IMMEDIATE MEDICAL CARE IF:   There is increasing redness, swelling, or pain in or around a wound.  There is a red line that goes up your leg.  Pus is  coming from a wound.  You develop a fever or as directed by your health care provider.  You notice a bad smell coming from an ulcer or wound. Document Released: 03/31/2000 Document Revised: 12/04/2012 Document Reviewed: 09/10/2012 [facility] Patient Information 2015 [place], [place].  This information is not intended to replace advice given to you by your health care provider. Make sure you discuss any questions you have with your health care provider. Diabetes Mellitus and Food It is important for you to manage your blood sugar (glucose) level. Your blood glucose level can be greatly affected by what you eat. Eating healthier foods in the appropriate amounts throughout the day at about the same time each day will help you control your blood glucose level. It can also help slow or prevent worsening of your diabetes mellitus. Healthy eating may even help you improve the level of your blood pressure and reach or maintain a healthy weight.  HOW CAN FOOD AFFECT ME? Carbohydrates Carbohydrates affect your blood glucose level more than any other type of food. Your dietitian will help you determine how many carbohydrates to eat at each meal and teach you how to count carbohydrates. Counting carbohydrates is important to keep your blood glucose at a healthy level, especially if you are using insulin or taking certain medicines for diabetes mellitus. Alcohol Alcohol can cause sudden decreases in blood glucose (hypoglycemia), especially if you use insulin or take certain medicines for diabetes mellitus. Hypoglycemia can be a life-threatening condition. Symptoms of hypoglycemia (sleepiness, dizziness, and disorientation) are similar to symptoms of having too much alcohol.  If your health care provider has given you approval to drink alcohol, do so in moderation and use the following guidelines:  Women should not have more than one drink per day, and men should not have more than two drinks per day. One drink is  equal to:  12 oz of beer.  5 oz of wine.  1 oz of hard liquor.  Do not drink on an empty stomach.  Keep yourself hydrated. Have water, diet soda, or unsweetened iced tea.  Regular soda, juice, and other mixers might contain a lot of carbohydrates and should be counted. WHAT FOODS ARE NOT RECOMMENDED? As you make food choices, it is important to remember that all foods are not the same. Some foods have fewer nutrients per serving than other foods, even though they might have the same number of calories or carbohydrates. It is difficult to get your body what it needs when you eat foods with fewer nutrients. Examples of foods that you should avoid that are high in calories and carbohydrates but low in nutrients include:  Trans fats (most processed foods list trans fats on the Nutrition Facts label).  Regular soda.  Juice.  Candy.  Sweets, such as cake, pie, doughnuts, and cookies.  Fried foods. WHAT FOODS CAN I EAT? Have nutrient-rich foods, which will nourish your body and keep you healthy. The food you should eat also will depend on several factors, including:  The calories you need.  The medicines you take.  Your weight.  Your blood glucose level.  Your blood pressure level.  Your cholesterol level. You also should eat a variety of foods, including:  Protein, such as meat, poultry, fish, tofu, nuts, and seeds (lean animal proteins are best).  Fruits.  Vegetables.  Dairy products, such as milk, cheese, and yogurt (low fat is best).  Breads, grains, pasta, cereal, rice, and beans.  Fats such as olive oil, trans fat-free margarine, canola oil, avocado, and olives. DOES EVERYONE WITH DIABETES MELLITUS HAVE THE SAME MEAL PLAN? Because every person with diabetes mellitus is different, there is not one meal plan that works for everyone. It is very important that you meet with a dietitian who  will help you create a meal plan that is just right for you. Document  Released: 12/29/2004 Document Revised: 04/08/2013 Document Reviewed: 02/28/2013 [facility] Patient Information 2015 [place]. This information is not intended to replace advice given to you by your health care provider. Make sure you discuss any questions you have with your health care provider. Lisinopril tablets What is this medicine? LISINOPRIL (lyse IN oh pril) is an ACE inhibitor. This medicine is used to treat high blood pressure and heart failure. It is also used to protect the heart immediately after a heart attack. This medicine may be used for other purposes; ask your health care provider or pharmacist if you have questions. COMMON BRAND NAME(S): Prinivil, Zestril What should I tell my health care provider before I take this medicine? They need to know if you have any of these conditions: -diabetes -heart or blood vessel disease -immune system disease like lupus or scleroderma -kidney disease -low blood pressure -previous swelling of the tongue, face, or lips with difficulty breathing, difficulty swallowing, hoarseness, or tightening of the throat -an unusual or allergic reaction to lisinopril, other ACE inhibitors, insect venom, foods, dyes, or preservatives -pregnant or trying to get pregnant -breast-feeding How should I use this medicine? Take this medicine by mouth with a glass of water. Follow the directions on your prescription label. You may take this medicine with or without food. Take your medicine at regular intervals. Do not stop taking this medicine except on the advice of your doctor or health care professional. Talk to your pediatrician regarding the use of this medicine in children. Special care may be needed. While this drug may be prescribed for children as young as 236 years of age for selected conditions, precautions do apply. Overdosage: If you think you have taken too much of this medicine contact a poison control center or emergency room at once. NOTE: This  medicine is only for you. Do not share this medicine with others. What if I miss a dose? If you miss a dose, take it as soon as you can. If it is almost time for your next dose, take only that dose. Do not take double or extra doses. What may interact with this medicine? -diuretics -lithium -NSAIDs, medicines for pain and inflammation, like ibuprofen or naproxen -over-the-counter herbal supplements like hawthorn -potassium salts or potassium supplements -salt substitutes This list may not describe all possible interactions. Give your health care provider a list of all the medicines, herbs, non-prescription drugs, or dietary supplements you use. Also tell them if you smoke, drink alcohol, or use illegal drugs. Some items may interact with your medicine. What should I watch for while using this medicine? Visit your doctor or health care professional for regular check ups. Check your blood pressure as directed. Ask your doctor what your blood pressure should be, and when you should contact him or her. Call your doctor or health care professional if you notice an irregular or fast heart beat. Women should inform their doctor if they wish to become pregnant or think they might be pregnant. There is a potential for serious side effects to an unborn child. Talk to your health care professional or pharmacist for more information. Check with your doctor or health care professional if you get an attack of severe diarrhea, nausea and vomiting, or if you sweat a lot. The loss of too much body fluid can make it dangerous for you to take this medicine. You may get drowsy or dizzy. Do not drive,  use machinery, or do anything that needs mental alertness until you know how this drug affects you. Do not stand or sit up quickly, especially if you are an older patient. This reduces the risk of dizzy or fainting spells. Alcohol can make you more drowsy and dizzy. Avoid alcoholic drinks. Avoid salt substitutes unless you  are told otherwise by your doctor or health care professional. Do not treat yourself for coughs, colds, or pain while you are taking this medicine without asking your doctor or health care professional for advice. Some ingredients may increase your blood pressure. What side effects may I notice from receiving this medicine? Side effects that you should report to your doctor or health care professional as soon as possible: -abdominal pain with or without nausea or vomiting -allergic reactions like skin rash or hives, swelling of the hands, feet, face, lips, throat, or tongue -dark urine -difficulty breathing -dizzy, lightheaded or fainting spell -fever or sore throat -irregular heart beat, chest pain -pain or difficulty passing urine -redness, blistering, peeling or loosening of the skin, including inside the mouth -unusually weak -yellowing of the eyes or skin Side effects that usually do not require medical attention (report to your doctor or health care professional if they continue or are bothersome): -change in taste -cough -decreased sexual function or desire -headache -sun sensitivity -tiredness This list may not describe all possible side effects. Call your doctor for medical advice about side effects. You may report side effects to FDA at 1-800-FDA-1088. Where should I keep my medicine? Keep out of the reach of children. Store at room temperature between 15 and 30 degrees C (59 and 86 degrees F). Protect from moisture. Keep container tightly closed. Throw away any unused medicine after the expiration date. NOTE: This sheet is a summary. It may not cover all possible information. If you have questions about this medicine, talk to your doctor, pharmacist, or health care provider.  2015, [employer]. (2007-10-07 17:36:32)

## 2014-09-17 NOTE — Progress Notes (Signed)
Subjective:    Patient ID: Ethan Jordan, male    DOB: [birth year], 59 y.o.   MRN: [id number]  HPI Ethan Jordan, a 59 year old male with a history of diabetes, COPD, hepatitis C and chronic right hip pain presents to establish care. He states that he was a patient of Dr. Lianne Bushyhinwen, [place] in [place] at [facility] through [facility]. Patient reports that he is unable to get [facility] due to transportation constraints.    Patient presents with a history of type 2 diabetes mellitus. He states that he has been consistently taking Novalog prior to meals based on a sliding scale and Lantus at night. He currently checks blood sugars 4 times per day. Symptoms have been well-controlled. Patient denies foot ulcerations, hyperglycemia, nausea, paresthesia of the feet, polydipsia, polyuria, visual disturbances, vomitting and weight loss.   Patient reports a history of COPD. He currently takes symbicort twice daily with maximum relief. Patient maintains that he was diagnosed at the [facility] many years ago. He was previously a 1 pack per day smoker prior to quitting in 2013. Patient is currently asymptomatic and has never been on oxygen therapy. He denies shortness of breath, fatigue, chronic cough, chest pains, or wheezing.   Ethan Jordan has a history of hypertension and is currently not taking medication. He reports that it has been years since he has taken medication for hypertension.   He is not exercising due to right hip pain and is not adherent to low salt diet.  Blood pressure has been well controlled.  Patient denies chest pain, dyspnea, fatigue, near-syncope, orthopnea, palpitations and tachypnea.  Cardiovascular risk factors: diabetes mellitus, hypertension, male gender and sedentary lifestyle.    He reports a history of chronic hip pain. He was recently followed by pain management [facility] 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 He maintains that he has experienced daily pain  since the hip replacement. His current pain intensity is 6/10 described as aching and constant.  He states that he missed several appointments and pain management is requiring a new referral from his primary care provider.  Past Medical History  Diagnosis Date  . Renal disorder   . Hepatitis C   . COPD (chronic obstructive pulmonary disease)     On inhaled steroids  . Diabetes mellitus    History   Social History  . Marital Status: Single    Spouse Name: N/A  . Number of Children: N/A  . Years of Education: N/A   Occupational History  . Not on file.   Social History Main Topics  . Smoking status: Former Smoker    Types: Cigarettes    Quit date: 09/21/2011  . Smokeless tobacco: Not on file     Comment: states he is using nicotine gum  . Alcohol Use: No  . Drug Use: Not on file  . Sexual Activity: Not on file   Other Topics Concern  . Not on file   Social History Narrative  .imm Review of Systems  Constitutional: Negative.  Negative for fever, fatigue and unexpected weight change.  HENT: Negative.   Eyes: Negative.   Respiratory: Negative.   Cardiovascular: Negative.  Negative for chest pain, palpitations and leg swelling.  Gastrointestinal: Negative.   Endocrine: Negative.  Negative for polydipsia, polyphagia and polyuria.  Genitourinary: Negative.   Musculoskeletal: Positive for myalgias (right hip pain).  Skin: Negative.   Allergic/Immunologic: Negative.   Neurological: Negative.  Negative for dizziness.  Hematological:  Negative.   Psychiatric/Behavioral: Negative.        Objective:   Physical Exam  Constitutional: He is oriented to person, place, and time. He appears well-developed and well-nourished.  HENT:  Head: Normocephalic and atraumatic.  Right Ear: Hearing, tympanic membrane, external ear and ear canal normal.  Left Ear: Hearing, tympanic membrane, external ear and ear canal normal.  Nose: Nose normal.  Mouth/Throat: Uvula is midline and  oropharynx is clear and moist.  Eyes: Conjunctivae, EOM and lids are normal. Pupils are equal, round, and reactive to light.  Neck: Trachea normal and normal range of motion. Neck supple.  Cardiovascular: Normal rate, regular rhythm, normal heart sounds and intact distal pulses.   Pulmonary/Chest: Effort normal and breath sounds normal.  Abdominal: Soft. Normal appearance and bowel sounds are normal.  Musculoskeletal: Normal range of motion.  Neurological: He is alert and oriented to person, place, and time. He has normal reflexes.  Skin: Skin is warm and dry.  Psychiatric: He has a normal mood and affect. His speech is normal and behavior is normal. Judgment and thought content normal. Cognition and memory are normal.      BP 135/76 mmHg  Pulse 94  Temp(Src) 98.6 F (37 C) (Oral)  Resp 16  Ht  (1.727 m)  Wt 189 lb (85.73 kg)  BMI 28.74 kg/m2  Assessment & Plan:  1. Diabetes mellitus type 2, uncomplicated Ethan Jordan reports that fasting blood sugars average from 90-110. He reports that he consistently takes Novalog according to sliding scale and Lantus 45 units HS. He is currently asymptomatic. We discussed the signs of hyper-hypoglycemia and patient expressed understanding. He was also given written materials. Current CBG is 110 fasting. Ethan Jordan currently has proteinuria. Will start Lisinopril 5 mg for kidney protection. Current  BUN and Creat are within normal limits. Will review previous medical records as they become available.   - lisinopril (PRINIVIL,ZESTRIL) 5 MG tablet; Take 1 tablet (5 mg total) by mouth daily.  Dispense: 90 tablet; Refill: 3 - Hemoglobin A1c - COMPLETE METABOLIC PANEL WITH GFR - POCT CBG (Fasting - Glucose)  2. Chronic obstructive pulmonary disease, unspecified COPD, unspecified chronic bronchitis type Ethan Jordan reports a history of COPD. He was diagnosed at the [place]. He was previously under the care of a pulmonologist. He states that he cannot  currently recall the name of the pulmonologist. He continues Symbicort twice daily. Patient's current nebulizer solution is expired. Will refill today, he states that it has been greater than 3 months since he has utilized nebulizer. COPD exacerbations have improved since patient quit smoking. Will review previous medical records as they become available.  - albuterol (PROVENTIL) (5 MG/ML) 0.5% nebulizer solution; Take 0.5 mLs (2.5 mg total) by nebulization daily as needed. For wheezing/sob  Dispense: 20 mL; Refill: 1  3. Essential hypertension Patient is not currently on medication for hypertension. Blood pressure is currently at goal. He states that he has been on medication for hypertension in the past but cannot recall medications. Will evaluate for proteinuria, BUN/creat.  - POCT urinalysis dipstick - Lipid Panel - COMPLETE METABOLIC PANEL WITH GFR  4. History of hepatitis C Ethan Jordan completed a 12 week regimen of Harvani therapy at the  [facility] in November 2015. He is followed by gastroenterology annually.   5. Chronic right hip pain Ethan Jordan reports that he had a total hip replacement in 2012. He states that he experiences daily right hip pain, which is controlled by  Hydrocodone 5-325. Recommend Tylenol 500 mg every 6 hours for mild to moderate pain.  - Ambulatory referral to [facility]   6. Abnormal urine findings Positive urine nitrites. Will send for culture - Urine culture; Future  Preventative:  Ethan Jordan reports that he is up to date with vaccinations and has had all in [place] will check [place] immunization registry as it becomes available.  Patient states that he had a colonoscopy around 7 years ago through the [place] Last eye examination 1 year ago.    Massie Maroon, FNP

## 2014-09-21 ENCOUNTER — Encounter: Payer: Self-pay | Admitting: Family Medicine

## 2014-09-21 DIAGNOSIS — R8290 Unspecified abnormal findings in urine: Secondary | ICD-10-CM | POA: Insufficient documentation

## 2014-09-21 DIAGNOSIS — G8929 Other chronic pain: Secondary | ICD-10-CM | POA: Insufficient documentation

## 2014-09-21 DIAGNOSIS — M25551 Pain in right hip: Secondary | ICD-10-CM

## 2014-09-21 DIAGNOSIS — Z8619 Personal history of other infectious and parasitic diseases: Secondary | ICD-10-CM | POA: Insufficient documentation

## 2014-10-22 ENCOUNTER — Ambulatory Visit: Payer: Medicaid Other | Admitting: Family Medicine

## 2016-12-16 DEATH — deceased
